# Patient Record
Sex: Female | Born: 1935
Health system: Southern US, Community
[De-identification: ages and names within clinical notes are randomized; demographics above are authoritative.]

## PROBLEM LIST (undated history)

## (undated) DIAGNOSIS — I509 Heart failure, unspecified: Secondary | ICD-10-CM

## (undated) DIAGNOSIS — F32A Depression, unspecified: Secondary | ICD-10-CM

## (undated) DIAGNOSIS — M1990 Unspecified osteoarthritis, unspecified site: Secondary | ICD-10-CM

## (undated) DIAGNOSIS — I219 Acute myocardial infarction, unspecified: Secondary | ICD-10-CM

## (undated) DIAGNOSIS — F329 Major depressive disorder, single episode, unspecified: Secondary | ICD-10-CM

## (undated) DIAGNOSIS — I10 Essential (primary) hypertension: Secondary | ICD-10-CM

## (undated) DIAGNOSIS — I2510 Atherosclerotic heart disease of native coronary artery without angina pectoris: Secondary | ICD-10-CM

## (undated) HISTORY — DX: Heart failure, unspecified: I50.9

## (undated) HISTORY — DX: Major depressive disorder, single episode, unspecified: F32.9

## (undated) HISTORY — DX: Essential (primary) hypertension: I10

## (undated) HISTORY — DX: Acute myocardial infarction, unspecified: I21.9

## (undated) HISTORY — DX: Depression, unspecified: F32.A

## (undated) HISTORY — DX: Unspecified osteoarthritis, unspecified site: M19.90

---

## 1992-07-11 DIAGNOSIS — I219 Acute myocardial infarction, unspecified: Secondary | ICD-10-CM

## 1992-07-11 HISTORY — DX: Acute myocardial infarction, unspecified: I21.9

## 1999-07-12 HISTORY — PX: CORONARY ARTERY BYPASS GRAFT: SHX141

## 2004-07-29 ENCOUNTER — Ambulatory Visit: Payer: Self-pay

## 2005-10-11 ENCOUNTER — Ambulatory Visit: Payer: Self-pay

## 2006-10-17 ENCOUNTER — Ambulatory Visit: Payer: Self-pay | Admitting: Internal Medicine

## 2008-01-08 ENCOUNTER — Ambulatory Visit: Payer: Self-pay | Admitting: Internal Medicine

## 2009-03-30 DIAGNOSIS — I2510 Atherosclerotic heart disease of native coronary artery without angina pectoris: Secondary | ICD-10-CM

## 2009-03-30 DIAGNOSIS — I10 Essential (primary) hypertension: Secondary | ICD-10-CM

## 2009-03-30 DIAGNOSIS — E7800 Pure hypercholesterolemia, unspecified: Secondary | ICD-10-CM

## 2009-04-07 ENCOUNTER — Ambulatory Visit: Payer: Self-pay | Admitting: Family Medicine

## 2010-01-19 ENCOUNTER — Ambulatory Visit: Payer: Self-pay | Admitting: Gastroenterology

## 2010-01-21 LAB — PATHOLOGY REPORT

## 2010-02-18 DIAGNOSIS — Z86010 Personal history of colonic polyps: Secondary | ICD-10-CM

## 2010-06-08 ENCOUNTER — Ambulatory Visit: Payer: Self-pay | Admitting: Family Medicine

## 2011-08-03 ENCOUNTER — Ambulatory Visit: Payer: Self-pay | Admitting: Family Medicine

## 2012-09-24 ENCOUNTER — Ambulatory Visit: Payer: Self-pay | Admitting: Family Medicine

## 2014-01-14 ENCOUNTER — Observation Stay: Payer: Self-pay | Admitting: Internal Medicine

## 2014-01-14 LAB — BASIC METABOLIC PANEL
Anion Gap: 8 (ref 7–16)
BUN: 11 mg/dL (ref 7–18)
CALCIUM: 9.3 mg/dL (ref 8.5–10.1)
CREATININE: 0.66 mg/dL (ref 0.60–1.30)
Chloride: 105 mmol/L (ref 98–107)
Co2: 26 mmol/L (ref 21–32)
GLUCOSE: 116 mg/dL — AB (ref 65–99)
Osmolality: 278 (ref 275–301)
Potassium: 3.9 mmol/L (ref 3.5–5.1)
SODIUM: 139 mmol/L (ref 136–145)

## 2014-01-14 LAB — CBC
HCT: 39.9 % (ref 35.0–47.0)
HGB: 13.8 g/dL (ref 12.0–16.0)
MCH: 32 pg (ref 26.0–34.0)
MCHC: 34.7 g/dL (ref 32.0–36.0)
MCV: 92 fL (ref 80–100)
Platelet: 119 10*3/uL — ABNORMAL LOW (ref 150–440)
RBC: 4.32 10*6/uL (ref 3.80–5.20)
RDW: 13 % (ref 11.5–14.5)
WBC: 10.4 10*3/uL (ref 3.6–11.0)

## 2014-01-14 LAB — TROPONIN I
Troponin-I: <0.02 ng/mL
Troponin-I: <0.02 ng/mL

## 2014-01-14 LAB — PRO B NATRIURETIC PEPTIDE: B-Type Natriuretic Peptide: 1816 pg/mL — ABNORMAL HIGH (ref 0–450)

## 2014-01-15 LAB — BASIC METABOLIC PANEL
Anion Gap: 9 (ref 7–16)
BUN: 12 mg/dL (ref 7–18)
CO2: 29 mmol/L (ref 21–32)
Calcium, Total: 8.9 mg/dL (ref 8.5–10.1)
Chloride: 102 mmol/L (ref 98–107)
Creatinine: 0.89 mg/dL (ref 0.60–1.30)
EGFR (African American): >60
Glucose: 122 mg/dL — ABNORMAL HIGH (ref 65–99)
Osmolality: 280 (ref 275–301)
Potassium: 2.9 mmol/L — ABNORMAL LOW (ref 3.5–5.1)
Sodium: 140 mmol/L (ref 136–145)

## 2014-01-15 LAB — TROPONIN I

## 2014-01-23 ENCOUNTER — Ambulatory Visit: Payer: Self-pay | Admitting: Family

## 2014-01-29 DIAGNOSIS — I25810 Atherosclerosis of coronary artery bypass graft(s) without angina pectoris: Secondary | ICD-10-CM | POA: Insufficient documentation

## 2014-01-29 DIAGNOSIS — I159 Secondary hypertension, unspecified: Secondary | ICD-10-CM | POA: Insufficient documentation

## 2014-06-30 ENCOUNTER — Ambulatory Visit: Payer: Self-pay | Admitting: Family Medicine

## 2014-07-21 ENCOUNTER — Ambulatory Visit: Payer: Self-pay | Admitting: Family Medicine

## 2014-11-01 NOTE — Discharge Summary (Signed)
Dates of Admission and Diagnosis:  Date of Admission 14-Jan-2014   Date of Discharge 15-Jan-2014   Admitting Diagnosis Acute chf   Final Diagnosis 1. Acute on chronic chf 2. HTN 3. CAD 4. Acute respiratory failure    Chief Complaint/History of Present Illness CHIEF COMPLAINT: Shortness of breath.   HISTORY OF PRESENT ILLNESS: An 81 year old Caucasian female patient with history of hypertension, CABG/coronary artery disease, presents to the Emergency Room complaining of shortness of breath which has progressively worsened over the past few days. She has experienced orthopnea lately, some swelling in her feet. Has had dry cough. Also complains of some pain in the right shoulder and right neck. She was on Lasix many years back but nothing recent. She does not complain of any chest pain. She is not any fluid or salt restriction diet but mentions that she does not drink much fluids or does not add any extra salt to her diet.   In the Emergency Room, the patient's sats have been found to be at 89% on room air with shortness of breath. BNP elevated at 1800. Chest x-ray showing some pulmonary edema and is being admitted to the hospitalist service.   Allergies:  No Known Allergies:   LabObservation:  08-Jul-15 12:53   OBSERVATION Reason for Test  Cardiology:  08-Jul-15 12:53   Echo Doppler REASON FOR EXAM:     COMMENTS:     PROCEDURE: [facility] - ECHO DOPPLER COMPLETE(TRANSTHOR)  - Jan 15 2014 12:53PM   RESULT: Echocardiogram Report  Patient Name:   Amanda Strickland Date of Exam: 01/15/2014 Medical Rec #:  [id number]           Custom1: Date of Birth:  01-04-1931       Height:       65.0 in Patient Age:    22 years         Weight:       163.1 lb Patient Gender: F                BSA:          1.81 m??  Indications: CHF Sonographer:    Sherrie Sport RDCS Referring Phys: Neita Carp  Summary:  1. Left ventricular ejection fraction, by visual estimation, is 50 to  55%.  2. Low normal global  left ventricular systolic function.  3. Mild left ventricular hypertrophy.  4. Mild mitral valve regurgitation.  5. Moderately increased left ventricular posterior wall thickness.  6. Mild tricuspid regurgitation. 2D AND M-MODE MEASUREMENTS (normal ranges within parentheses): Left Ventricle:          Normal IVSd (2D):      1.24 cm (0.7-1.1) LVPWd (2D):     1.31 cm (0.7-1.1) Aorta/LA:                  Normal LVIDd (2D):     4.39 cm (3.4-5.7) Aortic Root (2D): 3.00 cm (2.4-3.7) LVIDs (2D):     3.16 cm           Left Atrium (2D): 3.90 cm (1.9-4.0) LV FS (2D):     28.0 %   (>25%) LV EF (2D):     54.5 %   (>50%)                          Right Ventricle:  RVd (2D): LV DIASTOLIC FUNCTION: MV Peak E: 1.18 m/s E/e' Ratio: 25.80 MV Peak A: 0.81 m/s Decel Time: 197 msec E/A Ratio: 1.45 SPECTRAL DOPPLER ANALYSIS (where applicable): Mitral Valve: MV P1/2 Time: 57.13 msec MV Area, PHT: 3.85 cm?? Aortic Valve: AoV Max Vel: 1.17 m/s AoV Peak PG: 5.5 mmHg AoV Mean PG: LVOT Vmax: 0.86 m/s LVOT VTI:  LVOT Diameter: 2.10 cm AoV Area, Vmax: 2.54 cm?? AoV Area, VTI:  AoV Area, Vmn: Tricuspid Valve and PA/RV Systolic Pressure: TR Max Velocity: 2.16 m/s RA  Pressure: 5 mmHg RVSP/PASP: 23.7 mmHg Pulmonic Valve: PV Max Velocity: 0.90 m/s PV Max PG: 3.2 mmHg PV Mean PG:  PHYSICIAN INTERPRETATION: Left Ventricle: The left ventricular internal cavity size was normal. LV  posterior wall thickness was moderately increased. Mild left ventricular  hypertrophy. Global LV systolic function was low normal. Left ventricular  ejection fraction, by visual estimation, is 50 to 55%. Spectral Doppler  shows normal pattern of LV diastolic filling. Right Ventricle: The right ventricular size is normal. Left Atrium: The left atrium is normal in size. Right Atrium: The right atrium is normal in size. Pericardium: There is no evidence of pericardial effusion. Mitral Valve: Mild mitral  valve regurgitation is seen. Tricuspid Valve: Mild tricuspid regurgitation is visualized. The  tricuspid regurgitant velocity is 2.16 m/s, and with an assumed right  atrial pressure of 5 mmHg, the estimated right ventricular systolic  pressure is normal at 23.7 mmHg. Aortic Valve: The aortic valve is normal. Aorta: The aortic root is normal in size and structure.  [place] MD Electronically signed by 1950 Isaias Cowman MD Signature Date/Time: 01/15/2014/1:43:17 PM  *** Final *** IMPRESSION: .    Verified BySheppard Coil . PARASCHOS, M.D., MD  Routine Chem:  08-Jul-15 05:05   Glucose, Serum  122  BUN 12  Creatinine (comp) 0.89  Sodium, Serum 140  Potassium, Serum  2.9  Chloride, Serum 102  CO2, Serum 29  Calcium (Total), Serum 8.9  Anion Gap 9  Osmolality (calc) 280  eGFR (African American) >60  eGFR (Non-African American) >60 (eGFR values <28m/min/1.73 m2 may be an indication of chronic kidney disease (CKD). Calculated eGFR is useful in patients with stable renal function. The eGFR calculation will not be reliable in acutely ill patients when serum creatinine is changing rapidly. It is not useful in  patients on dialysis. The eGFR calculation may not be applicable to patients at the low and high extremes of body sizes, pregnant women, and vegetarians.)   Pertinent Past History:  Pertinent Past History PAST MEDICAL HISTORY: 1.  Coronary artery bypass graft 12 years back.  2.  Coronary artery disease.  3.  Hypertension.   Hospital Course:  Hospital Course * Acute on chronic diastolic chf Diuresed well with IV lasix. K supplemented. Feels back to baseline and was discharged home. Decompensated due to uncontrolled HTN  * UNcontrolled HTN Ran out of medications was waiting for them to arrive in mail Restarted on home meds and BP much better.  * Pulmonary fibrosis CXr suggested possible pulm fibrosis. Will have to f/u with PCP. If any issues  needs pulmonary referral  time spent on d/c 40 min  CHF clinic and cardiology f/u set up   Condition on Discharge Fair   Code Status:  Code Status Full Code   PHYSICAL EXAM ON DISCHARGE:  Physical Exam:  GEN obese   NECK supple  No masses   RESP normal resp effort  clear BS   CARD  regular rate   PSYCH alert, A+O to time, place, person   VITAL SIGNS:  Vital Signs: **Vital Signs.:   08-Jul-15 04:36  Vital Signs Type Routine  Temperature Temperature (F) 98.6  Celsius 37  Temperature Source oral  Pulse Pulse 60  Respirations Respirations 20  Systolic BP Systolic BP 712  Diastolic BP (mmHg) Diastolic BP (mmHg) 75  Mean BP 101  Pulse Ox % Pulse Ox % 92  Pulse Ox Activity Level  At rest  Oxygen Delivery Room Air/ 21 %    07:53  Vital Signs Type Routine  Temperature Temperature (F) 97.6  Celsius 36.4  Temperature Source oral  Pulse Pulse 67  Respirations Respirations 20  Systolic BP Systolic BP 929  Diastolic BP (mmHg) Diastolic BP (mmHg) 73  Mean BP 102  Pulse Ox % Pulse Ox % 91  Pulse Ox Activity Level  At rest  Oxygen Delivery Room Air/ 21 %    11:40  Vital Signs Type Routine  Temperature Temperature (F) 97.8  Celsius 36.5  Temperature Source oral  Pulse Pulse 54  Respirations Respirations 20  Systolic BP Systolic BP 090  Diastolic BP (mmHg) Diastolic BP (mmHg) 69  Mean BP 87  Pulse Ox % Pulse Ox % 90  Pulse Ox Activity Level  At rest  Oxygen Delivery Room Air/ 21 %   DISCHARGE INSTRUCTIONS HOME MEDS:  Medication Reconciliation: Patient's Home Medications at Discharge:     Medication Instructions  fish oil - oral capsule  1 cap(s) orally once a day   vitamin e  1 cap(s) orally once a day   multivitamin  1 tab(s) orally once a day   benazepril-hydrochlorothiazide 20 mg-12.5 mg oral tablet  1 tab(s) orally once a day   metoprolol succinate 50 mg oral tablet, extended release  1 tab(s) orally once a day   amlodipine 5 mg oral tablet  1 tab(s)  orally once a day   furosemide 40 mg oral tablet  1 tab(s) orally once a day   klor-con m20 oral tablet, extended release  1 tab(s) orally once a day   aspirin 81 mg oral tablet  1 tab(s) orally once a day     Physician's Instructions:  Diet Low Sodium   Activity Limitations As tolerated   Return to Work Not Applicable   Time frame for Follow Up Appointment 1-2 weeks  PCP   Time frame for Follow Up Appointment 1-2 weeks  Dr. Mohammed Kindle seen him before   Time frame for Follow Up Appointment 1-2 weeks  Next avilable chf clinic appt   Electronic Signatures: Alba Destine (MD)  (Signed 09-Jul-15 13:48)  Authored: ADMISSION DATE AND DIAGNOSIS, CHIEF COMPLAINT/HPI, Allergies, PERTINENT LABS, PERTINENT PAST HISTORY, HOSPITAL COURSE, PHYSICAL EXAM ON DISCHARGE, VITAL SIGNS, DISCHARGE INSTRUCTIONS HOME MEDS, PATIENT INSTRUCTIONS   Last Updated: 09-Jul-15 13:48 by Alba Destine (MD)

## 2014-11-01 NOTE — H&P (Signed)
PATIENT NAME:  Amanda Strickland, Amanda Strickland MR#:  [id number] DATE OF BIRTH:  16-Dec-1930  DATE OF ADMISSION:  01/14/2014  PRIMARY CARE PHYSICIAN:  Jerrell Belfast, MD  PRIMARY CARDIOLOGIST: Javier Docker. Fath, MD, but the patient has not seen him in many years.   CHIEF COMPLAINT: Shortness of breath.   HISTORY OF PRESENT ILLNESS: An 81 year old Caucasian female patient with history of hypertension, CABG/coronary artery disease, presents to the Emergency Room complaining of shortness of breath which has progressively worsened over the past few days. She has experienced orthopnea lately, some swelling in her feet. Has had dry cough. Also complains of some pain in the right shoulder and right neck. She was on Lasix many years back but nothing recent. She does not complain of any chest pain. She is not any fluid or salt restriction diet but mentions that she does not drink much fluids or does not add any extra salt to her diet.   In the Emergency Room, the patient's sats have been found to be at 89% on room air with shortness of breath. BNP elevated at 1800. Chest x-ray showing some pulmonary edema and is being admitted to the hospitalist service.   PAST MEDICAL HISTORY: 1.  Coronary artery bypass graft 12 years back.  2.  Coronary artery disease.  3.  Hypertension.   PAST SURGICAL HISTORY: Coronary artery bypass graft.   SOCIAL HISTORY: The patient does not smoke. No alcohol. No illicit drugs. Lives alone. Ambulates on her own.   CODE STATUS: Full code.   FAMILY HISTORY: Coronary artery disease.   HOME MEDICATIONS: 1.  Vitamin E 1 capsule oral once a day.  2.  Multivitamin 1 tablet daily.  3.  Metoprolol succinate 50 mg daily.  4.  Fish oil oral capsule 1000 mg daily.  5.  Benazepril/hydrochlorothiazide 20/12.5 one tablet daily.  6.  Amlodipine 5 mg daily.  7.  Aspirin 81 mg daily.   ALLERGIES: No known drug allergies.   REVIEW OF SYSTEMS:    CONSTITUTIONAL: Complains of some fatigue.  EYES: No  blurred vision, pain or redness.  ENT: No tinnitus, ear pain, hearing loss.  RESPIRATORY: Has dry cough. No wheeze. No chronic obstructive pulmonary disease.  CARDIOVASCULAR: No chest pain, has orthopnea and edema.  GASTROINTESTINAL: No nausea, vomiting, diarrhea, abdominal pain.  GENITOURINARY: No dysuria, hematuria, frequency.  ENDOCRINE: No polyuria, nocturia or thyroid problems.  HEMATOLOGIC AND LYMPHATIC: No anemia, easy bruising, bleeding.  INTEGUMENTARY: No acne, rash, lesion.  MUSCULOSKELETAL: No back pain, arthritis.  NEUROLOGICAL: No focal numbness, weakness, seizure.  PSYCHIATRIC: No anxiety or depression.   PHYSICAL EXAMINATION: VITAL SIGNS: Temperature 98.2, pulse of 70, blood pressure of 193/92, saturating 89% on room air, 92% on 2 liters oxygen.  GENERAL: Obese Caucasian female patient lying in bed.  PSYCHIATRIC: Alert and oriented x 3. Mood and affect appropriate. Judgment intact.  HEENT: Atraumatic, normocephalic. Oral mucosa moist and pink. External ears and nose normal. No pallor. No icterus. Pupils bilaterally equal and reactive to light.  NECK: Supple. No thyromegaly or palpable lymph nodes.  Trachea midline. No carotid bruit or JVD.  CARDIOVASCULAR: S1, S2 without any murmurs. Peripheral pulses 2+. Has 1+ edema.  RESPIRATORY: Has bilateral basal crackles, increased work of breathing.  GASTROINTESTINAL: Soft abdomen, nontender. Bowel sounds present. No visceromegaly palpable.   GENITOURINARY:  No CVA tenderness or bladder distention.  SKIN: Warm and dry. No petechiae, rash, ulcers.  MUSCULOSKELETAL: No joint swelling or redness in large joints. Normal muscle tone.  NEUROLOGICAL: Motor strength 5 out of 5 in upper and lower extremities. Sensation is intact all over.  LYMPHATIC: No cervical lymphadenopathy.   LABORATORY, DIAGNOSTIC AND RADIOLOGICAL DATA:  Shows:  1.  Glucose of 116, BNP of 1800, BUN 11, creatinine 0.66, sodium 139, potassium 3.9, GFR greater than 60.  Troponin less than 0.02.  2.  WBC 10.4, hemoglobin 11.8, platelets of 119.  3.  EKG shows normal sinus rhythm, left ventricular hypertrophy, poor R wave progression, no ST elevation.  4.  Chest x-ray shows pulmonary fibrosis and pulmonary edema.   ASSESSMENT AND PLAN: 1.  Acute respiratory failure secondary to acute on chronic congestive heart failure, unknown if systolic or diastolic. We will check an echocardiogram. We will start the patient on IV Lasix. Monitor Is and Os, fluid and salt restriction, cardiac diet. The patient does have history of coronary artery disease, coronary artery bypass graft, was on Lasix in the past which was stopped many years prior. Presently, her blood pressure is elevated and likely cause of decompensation.  2.  Uncontrolled hypertension. Patient has run out of the medication. She was awaiting home medications in the mail which have arrived earlier and she has taken her medications. Will continue home medications. Use IV p.r.n. medications as needed.  3.  Pulmonary fibrosis. Patient does not have this diagnosis but chest x-ray suggests pulmonary fibrosis. Will need outpatient pulmonary followup after discharge.  4.  Coronary artery disease, stable.  5.  Deep vein thrombosis prophylaxis with Lovenox.  6.  CODE STATUS: Full code.   TIME SPENT TODAY: On this case was 50 minutes.   ____________________________ Leia Alf Dio Giller, MD srs:cs D: 01/14/2014 17:41:52 ET T: 01/14/2014 18:05:43 ET JOB#: [id number]  cc: Alveta Heimlich R. Anthony Roland, MD, <Dictator> Jerrell Belfast, MD Javier Docker. Ubaldo Glassing, MD  Neita Carp MD ELECTRONICALLY SIGNED 01/21/2014 18:15

## 2014-11-19 DIAGNOSIS — R42 Dizziness and giddiness: Secondary | ICD-10-CM

## 2014-11-19 DIAGNOSIS — I5032 Chronic diastolic (congestive) heart failure: Secondary | ICD-10-CM

## 2014-11-19 DIAGNOSIS — I10 Essential (primary) hypertension: Secondary | ICD-10-CM

## 2015-02-06 ENCOUNTER — Telehealth: Payer: Self-pay | Admitting: Family Medicine

## 2015-02-06 NOTE — Telephone Encounter (Signed)
Pt's daughter Karena Addison called because they continue to get bills from [employer] for the Vitamin D that was done on 07/21/14 and insurance hasn't paid because of the incorrect diagnosis code. I advised that usually we request the bill to be brought in. I wanted to make sure that was correct. Thanks TNP

## 2015-05-25 ENCOUNTER — Ambulatory Visit (INDEPENDENT_AMBULATORY_CARE_PROVIDER_SITE_OTHER): Payer: Commercial Managed Care - HMO | Admitting: Family Medicine

## 2015-05-25 ENCOUNTER — Encounter: Payer: Self-pay | Admitting: Family Medicine

## 2015-05-25 ENCOUNTER — Other Ambulatory Visit: Payer: Self-pay

## 2015-05-25 VITALS — BP 142/68 | HR 75 | Temp 98.5°F | Resp 16 | Wt 172.0 lb

## 2015-05-25 DIAGNOSIS — R253 Fasciculation: Secondary | ICD-10-CM | POA: Insufficient documentation

## 2015-05-25 DIAGNOSIS — N183 Chronic kidney disease, stage 3 unspecified: Secondary | ICD-10-CM | POA: Insufficient documentation

## 2015-05-25 DIAGNOSIS — I8390 Asymptomatic varicose veins of unspecified lower extremity: Secondary | ICD-10-CM | POA: Insufficient documentation

## 2015-05-25 DIAGNOSIS — I110 Hypertensive heart disease with heart failure: Secondary | ICD-10-CM | POA: Insufficient documentation

## 2015-05-25 DIAGNOSIS — R05 Cough: Secondary | ICD-10-CM

## 2015-05-25 DIAGNOSIS — R059 Cough, unspecified: Secondary | ICD-10-CM

## 2015-05-25 DIAGNOSIS — M542 Cervicalgia: Secondary | ICD-10-CM | POA: Diagnosis not present

## 2015-05-25 DIAGNOSIS — R509 Fever, unspecified: Secondary | ICD-10-CM | POA: Diagnosis not present

## 2015-05-25 DIAGNOSIS — R739 Hyperglycemia, unspecified: Secondary | ICD-10-CM | POA: Insufficient documentation

## 2015-05-25 DIAGNOSIS — M810 Age-related osteoporosis without current pathological fracture: Secondary | ICD-10-CM | POA: Insufficient documentation

## 2015-05-25 LAB — POC INFLUENZA A&B (BINAX/QUICKVUE)
INFLUENZA A, POC: NEGATIVE
Influenza B, POC: NEGATIVE

## 2015-05-25 MED ORDER — GUAIFENESIN-CODEINE 100-10 MG/5ML PO SOLN: 5.0000 mL | Freq: Four times a day (QID) | ORAL | Status: DC | PRN

## 2015-05-25 NOTE — Progress Notes (Signed)
Patient ID: Amanda Strickland, female   DOB: [birth year], 81 y.o.   MRN: [id number] Name: Amanda Strickland   MRN: [id number]    DOB: 31-Mar-1931   Date:05/25/2015       Progress Note  Subjective  Chief Complaint  Chief Complaint  Patient presents with  . URI   URI  This is a new problem. The current episode started in the past 7 days. The problem has been unchanged. Maximum temperature: 100.0 F yesterday. Associated symptoms include congestion, coughing, headaches, joint pain, neck pain, rhinorrhea, sneezing and a sore throat. Associated symptoms comments: No neck or shoulder pains until yesterday. Some greenish sputum production.. She has tried decongestant, NSAIDs and acetaminophen for the symptoms. The treatment provided mild relief.   Past Surgical History  Procedure Laterality Date  . Coronary artery bypass graft  2001    Past Medical History  Diagnosis Date  . CHF (congestive heart failure) ([place])   . Hypertension   . Heart attack ([place]) 1994  . Depression   . Arthritis    Social History  Substance Use Topics  . Smoking status: Never Smoker   . Smokeless tobacco: Never Used  . Alcohol Use: No    Current outpatient prescriptions:  .  amLODipine (NORVASC) 5 MG tablet, Take 5 mg by mouth daily., Disp: , Rfl:  .  aspirin 81 MG tablet, Take 81 mg by mouth daily., Disp: , Rfl:  .  benazepril-hydrochlorthiazide (LOTENSIN HCT) 20-12.5 MG per tablet, Take 1 tablet by mouth daily., Disp: , Rfl:  .  CALCIUM CARBONATE-VIT D-MIN PO, Take by mouth., Disp: , Rfl:  .  L-Lysine 500 MG CAPS, Take by mouth., Disp: , Rfl:  .  metoprolol succinate (TOPROL-XL) 50 MG 24 hr tablet, Take 50 mg by mouth daily. Take with or immediately following a meal., Disp: , Rfl:  .  Misc Natural Products (OSTEO BI-FLEX ADV JOINT SHIELD) TABS, Take by mouth., Disp: , Rfl:  .  Multiple Vitamin (MULTIVITAMIN) capsule, Take 1 capsule by mouth daily., Disp: , Rfl:  .  Omega-3 Fatty Acids (FISH OIL) 1000 MG CAPS,  Take 1 capsule by mouth daily., Disp: , Rfl:  .  simvastatin (ZOCOR) 20 MG tablet, Take by mouth., Disp: , Rfl:  .  vitamin E 100 UNIT capsule, Take 100 Units by mouth daily., Disp: , Rfl:  .  furosemide (LASIX) 40 MG tablet, Take 40 mg by mouth daily., Disp: , Rfl:  .  potassium chloride SA (K-DUR,KLOR-CON) 20 MEQ tablet, Take 20 mEq by mouth daily., Disp: , Rfl:   No Known Allergies  Review of Systems  Constitutional: Negative.   HENT: Positive for congestion, rhinorrhea, sneezing and sore throat.   Eyes: Negative.   Respiratory: Positive for cough.   Cardiovascular: Negative.   Gastrointestinal: Negative.   Genitourinary: Negative.   Musculoskeletal: Positive for joint pain and neck pain.  Skin: Negative.   Neurological: Positive for headaches.  Endo/Heme/Allergies: Negative.   Psychiatric/Behavioral: Negative.    Objective  Filed Vitals:   05/25/15 1355  BP: 142/68  Pulse: 75  Temp: 98.5 F (36.9 C)  TempSrc: Oral  Resp: 16  Weight: 172 lb (78.019 kg)  SpO2: 97%   Physical Exam  Constitutional: She is oriented to person, place, and time and well-developed, well-nourished, and in no distress.  HENT:  Head: Normocephalic and atraumatic.  Right Ear: External ear normal.  Left Ear: External ear normal.  Nose: Nose normal.  Mouth/Throat: Oropharynx is clear and moist.  Good transillumination of sinuses. No redness of throat.   Neck:  Soreness in posterior cervical muscles and decreased ROM due to spasm.  Cardiovascular: Normal rate, regular rhythm and normal heart sounds.   Pulmonary/Chest: Effort normal and breath sounds normal.  Abdominal: Soft. Bowel sounds are normal.  Musculoskeletal: She exhibits tenderness.  Cervical muscles posteriorly. Limits ROM.  Lymphadenopathy:    She has no cervical adenopathy.  Neurological: She is alert and oriented to person, place, and time.  Psychiatric: Affect and judgment normal.    Assessment & Plan  1. Fever and  chills Onset yesterday with cough and headache. Influenza A&B tests are negative. Increase fluid intake and may use Tylenol or Advil prn. Will get CBC with diff to rule out bacterial infection. Recheck pending lab report. - POC Influenza A&B (Binax test) - CBC with Differential/Platelet  2. Neck pain Onset yesterday with cough and headache. Sharp pain with certain movements of head. No rigidity but some guarding. Apply moist heat and use of codeine cough syrup should help with discomfort. - POC Influenza A&B (Binax test)  3. Cough Onset yesterday with very little sputum production. Treat with Robitussin-AC and increase in fluid intake. Home to rest and recheck pending lab reports. - POC Influenza A&B (Binax test)

## 2015-05-26 LAB — CBC WITH DIFFERENTIAL/PLATELET
BASOS: 1 %
Basophils Absolute: 0.1 10*3/uL (ref 0.0–0.2)
EOS (ABSOLUTE): 0.3 10*3/uL (ref 0.0–0.4)
EOS: 2 %
HEMATOCRIT: 40.7 % (ref 34.0–46.6)
HEMOGLOBIN: 14.5 g/dL (ref 11.1–15.9)
Immature Grans (Abs): 0.1 10*3/uL (ref 0.0–0.1)
Immature Granulocytes: 1 %
LYMPHS ABS: 2.5 10*3/uL (ref 0.7–3.1)
Lymphs: 18 %
MCH: 32.1 pg (ref 26.6–33.0)
MCHC: 35.6 g/dL (ref 31.5–35.7)
MCV: 90 fL (ref 79–97)
MONOCYTES: 13 %
Monocytes Absolute: 1.5 10*3/uL — ABNORMAL HIGH (ref 0.1–0.9)
NEUTROS ABS: 8.5 10*3/uL — AB (ref 1.4–7.0)
Neutrophils: 65 %
Platelets: 192 10*3/uL (ref 150–379)
RBC: 4.52 x10E6/uL (ref 3.77–5.28)
RDW: 13.1 % (ref 12.3–15.4)
WBC: 12.9 10*3/uL — ABNORMAL HIGH (ref 3.4–10.8)

## 2015-05-29 ENCOUNTER — Telehealth: Payer: Self-pay

## 2015-05-29 DIAGNOSIS — D72829 Elevated white blood cell count, unspecified: Secondary | ICD-10-CM

## 2015-05-29 DIAGNOSIS — R059 Cough, unspecified: Secondary | ICD-10-CM

## 2015-05-29 DIAGNOSIS — R05 Cough: Secondary | ICD-10-CM

## 2015-05-29 MED ORDER — AMOXICILLIN 875 MG PO TABS: 875.0000 mg | ORAL_TABLET | Freq: Two times a day (BID) | ORAL | Status: DC

## 2015-05-29 NOTE — Telephone Encounter (Signed)
-----   Message from Margo Common, [place] sent at 05/29/2015 12:37 AM EST ----- Final report of blood cell counts show elevation of WBC count with increase in neutrophils. Usually indicates some bacterial infection. If any further cough, sputum production or fever will need antibiotic (Amoxicillin 875 mg BID #20) and chest x-ray. Recheck in 10 days to be sure WBC counts back to normal.

## 2015-05-29 NOTE — Telephone Encounter (Signed)
Patient advised as directed below. Patient verbalized understanding. Patient states she is still coughing, fever and sputum production. RX sent to pharmacy. CXR ordered. Patient states she will get CXR on Monday.

## 2015-05-29 NOTE — Telephone Encounter (Signed)
LMTCB

## 2015-05-29 NOTE — Telephone Encounter (Signed)
Pt called back. °

## 2015-06-01 ENCOUNTER — Ambulatory Visit
Admission: RE | Admit: 2015-06-01 | Discharge: 2015-06-01 | Disposition: A | Discharge status: Home / Self Care | Payer: Commercial Managed Care - HMO | Source: Ambulatory Visit | Attending: Family Medicine | Admitting: Family Medicine

## 2015-06-01 ENCOUNTER — Other Ambulatory Visit: Payer: Self-pay | Admitting: Family Medicine

## 2015-06-01 ENCOUNTER — Telehealth: Payer: Self-pay | Admitting: Family Medicine

## 2015-06-01 DIAGNOSIS — R05 Cough: Secondary | ICD-10-CM | POA: Diagnosis not present

## 2015-06-01 DIAGNOSIS — R059 Cough, unspecified: Secondary | ICD-10-CM

## 2015-06-01 NOTE — Telephone Encounter (Signed)
Will call patient when receive CXR results.

## 2015-06-01 NOTE — Telephone Encounter (Signed)
CXR ordered changed to [place]. Patient advised.

## 2015-06-01 NOTE — Telephone Encounter (Signed)
Pt would like to have her chest x-ray moved to the [place] [facility].  Pt states you set her one up for the [facility].  Please contact the pt once the x-ray is moved.

## 2015-06-01 NOTE — Telephone Encounter (Signed)
Pt's daughter Margarita Grizzle called to get results from pt's chest Xray that was done today 06/01/15 but since she isn't on DPR she request that we call her sister Karena Addison @ [phone]. Thanks TNP

## 2015-06-03 ENCOUNTER — Telehealth: Payer: Self-pay | Admitting: Family Medicine

## 2015-06-03 DIAGNOSIS — J40 Bronchitis, not specified as acute or chronic: Secondary | ICD-10-CM

## 2015-06-03 MED ORDER — AMOXICILLIN-POT CLAVULANATE 875-125 MG PO TABS: 1.0000 | ORAL_TABLET | Freq: Two times a day (BID) | ORAL | Status: DC

## 2015-06-03 MED ORDER — ALBUTEROL SULFATE HFA 108 (90 BASE) MCG/ACT IN AERS: 1.0000 | INHALATION_SPRAY | Freq: Four times a day (QID) | RESPIRATORY_TRACT | Status: DC | PRN

## 2015-06-03 NOTE — Telephone Encounter (Signed)
Pt seen on 05/25/2015. Had CXR performed. Renaldo Fiddler, CMA

## 2015-06-03 NOTE — Telephone Encounter (Signed)
Margarita Grizzle pt's daughter called wanting to get her mom an inhaler for cough.  She uses [facility].  Daughters callback is [phone]  Please let daughter know if you are going to send an order to [facility].    Thanks Con Memos

## 2015-06-03 NOTE — Telephone Encounter (Signed)
Pt's daughter called wanting to know results from chest xr earlier this week.  She said mom was still coughing up yellow and green.  She normally sees Dr. Venia Minks and she ask if Simona Huh has not reviewed the xray could Dr. Venia Minks.    She would like to know the results today with the holiday coming up.  Her call back is [phone]  Thanks Con Memos

## 2015-06-03 NOTE — Telephone Encounter (Signed)
[employer] if insurance will cover it. Renaldo Fiddler, CMA

## 2015-06-03 NOTE — Telephone Encounter (Signed)
Advised daughter as below. She reports that Simona Huh prescribed Amoxil 875mg  and patient is still currently taking medication. She is requesting switching to a stronger abx. Changed abx to Augmentin X 10days per Dr. Venia Minks. Medication was sent into the pharmacy. Advised to call if not improving.

## 2015-06-03 NOTE — Telephone Encounter (Signed)
Please clarify what inhaler. Thanks.

## 2015-06-03 NOTE — Telephone Encounter (Signed)
Daughter called back wanting to know if her mom could get an inhaler for her cough.  She has [employer].  Please let daughter know if we are sending an order to [facility]  Daughters call back  [phone]  Thanks Con Memos

## 2015-06-03 NOTE — Telephone Encounter (Signed)
CXR shows possible bronchitis. No pneumonia. Please clarify if patient was treated with antibiotic, how long she has been sick and is she worsening. Thanks.

## 2015-06-03 NOTE — Telephone Encounter (Signed)
Amanda Strickland is out of office today. Can you review CXR results.

## 2015-06-08 ENCOUNTER — Telehealth: Payer: Self-pay

## 2015-06-08 ENCOUNTER — Encounter: Payer: Self-pay | Admitting: Family Medicine

## 2015-06-08 ENCOUNTER — Ambulatory Visit (INDEPENDENT_AMBULATORY_CARE_PROVIDER_SITE_OTHER): Payer: Commercial Managed Care - HMO | Admitting: Family Medicine

## 2015-06-08 VITALS — BP 122/70 | HR 72 | Temp 97.6°F | Resp 18 | Wt 170.6 lb

## 2015-06-08 DIAGNOSIS — J209 Acute bronchitis, unspecified: Secondary | ICD-10-CM | POA: Diagnosis not present

## 2015-06-08 MED ORDER — ALBUTEROL SULFATE (2.5 MG/3ML) 0.083% IN NEBU: 2.5000 mg | INHALATION_SOLUTION | Freq: Once | RESPIRATORY_TRACT | Status: DC

## 2015-06-08 MED ORDER — BUDESONIDE-FORMOTEROL FUMARATE 160-4.5 MCG/ACT IN AERO: 2.0000 | INHALATION_SPRAY | Freq: Two times a day (BID) | RESPIRATORY_TRACT | Status: DC

## 2015-06-08 MED ORDER — PREDNISONE 10 MG PO TABS: ORAL_TABLET | ORAL | Status: DC

## 2015-06-08 NOTE — Progress Notes (Signed)
Patient ID: Amanda Strickland, female   DOB: 09-30-1930, 81 y.o.   MRN: [id number]     Subjective:  HPI Bronchitis: Follow up from 05/25/2015. Started Amoxil and Guaifenesin-Codeine on 05/25/2015. Dr. Venia Minks changed antibiotic to Augmentin on 06/03/2015. Feeling some improvement since changing the antibiotic but still some cough with occasional wheeze. No further fever and occasional sputum production. No URI symptoms or fever.    Prior to Admission medications   Medication Sig Start Date End Date Taking? Authorizing Provider  albuterol (PROVENTIL HFA;VENTOLIN HFA) 108 (90 BASE) MCG/ACT inhaler Inhale 1-2 puffs into the lungs 4 (four) times daily as needed for wheezing or shortness of breath. 06/03/15  Yes Margarita Rana, MD  amLODipine (NORVASC) 5 MG tablet Take 5 mg by mouth daily.   Yes Historical Provider, MD  amoxicillin-clavulanate (AUGMENTIN) 875-125 MG tablet Take 1 tablet by mouth 2 (two) times daily. 06/03/15  Yes Margarita Rana, MD  aspirin 81 MG tablet Take 81 mg by mouth daily.   Yes Historical Provider, MD  benazepril-hydrochlorthiazide (LOTENSIN HCT) 20-12.5 MG per tablet Take 1 tablet by mouth daily.   Yes Historical Provider, MD  CALCIUM CARBONATE-VIT D-MIN PO Take by mouth.   Yes Historical Provider, MD  furosemide (LASIX) 40 MG tablet Take 40 mg by mouth daily.   Yes Historical Provider, MD  guaiFENesin-codeine 100-10 MG/5ML syrup Take 5 mLs by mouth every 6 (six) hours as needed for cough. 05/25/15  Yes Dennis E Chrismon, PA  L-Lysine 500 MG CAPS Take by mouth.   Yes Historical Provider, MD  metoprolol succinate (TOPROL-XL) 50 MG 24 hr tablet Take 50 mg by mouth daily. Take with or immediately following a meal.   Yes Historical Provider, MD  Misc Natural Products (OSTEO BI-FLEX ADV JOINT SHIELD) TABS Take by mouth.   Yes Historical Provider, MD  Multiple Vitamin (MULTIVITAMIN) capsule Take 1 capsule by mouth daily.   Yes Historical Provider, MD  Omega-3 Fatty Acids (FISH  OIL) 1000 MG CAPS Take 1 capsule by mouth daily.   Yes Historical Provider, MD  potassium chloride SA (K-DUR,KLOR-CON) 20 MEQ tablet Take 20 mEq by mouth daily.   Yes Historical Provider, MD  simvastatin (ZOCOR) 20 MG tablet Take by mouth. 05/27/14  Yes Historical Provider, MD  vitamin E 100 UNIT capsule Take 100 Units by mouth daily.   Yes Historical Provider, MD    Patient Active Problem List   Diagnosis Date Noted  . Chronic kidney disease (CKD), stage III (moderate) 05/25/2015  . Congestive heart failure due to high blood pressure ([place]) 05/25/2015  . Fasciculation 05/25/2015  . Blood glucose elevated 05/25/2015  . OP (osteoporosis) 05/25/2015  . Leg varices 05/25/2015  . Chronic diastolic heart failure ([place]) 11/19/2014  . HTN (hypertension) 11/19/2014  . Dizziness 11/19/2014  . Arteriosclerosis of bypass graft of coronary artery 01/29/2014  . History of colon polyps 02/18/2010  . Benign hypertension 03/30/2009  . Arteriosclerosis of coronary artery 03/30/2009  . Hypercholesteremia 03/30/2009    Past Medical History  Diagnosis Date  . CHF (congestive heart failure) ([place])   . Hypertension   . Heart attack ([place]) 1994  . Depression   . Arthritis     Social History   Social History  . Marital Status: Widowed    Spouse Name: N/A  . Number of Children: N/A  . Years of Education: N/A   Occupational History  . Not on file.   Social History Main Topics  . Smoking status: Never Smoker   .  Smokeless tobacco: Never Used  . Alcohol Use: No  . Drug Use: No  . Sexual Activity: Not on file   Other Topics Concern  . Not on file   Social History Narrative    No Known Allergies  Review of Systems  Constitutional: Negative.   HENT: Positive for congestion.   Eyes: Negative.   Respiratory: Positive for cough, sputum production and wheezing.   Cardiovascular: Negative.   Gastrointestinal: Negative.   Genitourinary: Negative.   Musculoskeletal: Negative.   Skin:  Negative.   Neurological: Negative.   Endo/Heme/Allergies: Negative.   Psychiatric/Behavioral: Negative.      There is no immunization history on file for this patient. Objective:  BP 122/70 mmHg  Pulse 72  Temp(Src) 97.6 F (36.4 C) (Oral)  Resp 18  Wt 170 lb 9.6 oz (77.384 kg)  SpO2 98%  Physical Exam  Constitutional: She is oriented to person, place, and time and well-developed, well-nourished, and in no distress.  HENT:  Head: Normocephalic.  Right Ear: External ear normal.  Left Ear: External ear normal.  Nose: Nose normal.  Mouth/Throat: Oropharynx is clear and moist.  Eyes: Conjunctivae and EOM are normal.  Neck: Neck supple.  Cardiovascular: Normal rate, regular rhythm and normal heart sounds.   Pulmonary/Chest:  Coarse rhonchi in the right posterior base.  Abdominal: Soft. Bowel sounds are normal.  Musculoskeletal: Normal range of motion.  Lymphadenopathy:    She has no cervical adenopathy.  Neurological: She is alert and oriented to person, place, and time.    Lab Results  Component Value Date   WBC 12.9* 05/25/2015   HGB 13.8 01/14/2014   HCT 40.7 05/25/2015   PLT 119* 01/14/2014   GLUCOSE 122* 01/15/2014    CMP     Component Value Date/Time   NA 140 01/15/2014 0505   K 2.9* 01/15/2014 0505   CL 102 01/15/2014 0505   CO2 29 01/15/2014 0505   GLUCOSE 122* 01/15/2014 0505   BUN 12 01/15/2014 0505   CREATININE 0.89 01/15/2014 0505   CALCIUM 8.9 01/15/2014 0505   GFRNONAA >60 01/15/2014 0505   GFRAA >60 01/15/2014 0505    Assessment and Plan :  1. Bronchitis, acute, with bronchospasm Some improvement since changing from the Amoxicillin to the Augmentin. Still using Albuterol prn for wheeze and Guaifenesin with Codeine for cough. Given nebulizer with albuterol to loosen congestion and added Symbicort for wheeze BID. Added prednisone taper and increased fluid intake. Recheck after finishing the antibiotic if needed. - predniSONE (DELTASONE) 10 MG  tablet; Take taper as directed over the next 6 days. (6,5,4,3,2,1)  Dispense: 21 tablet; Refill: 0 - budesonide-formoterol (SYMBICORT) 160-4.5 MCG/ACT inhaler; Inhale 2 puffs into the lungs 2 (two) times daily.  Dispense: 3 Inhaler; Refill: [facility] 06/08/2015 2:16 PM

## 2015-06-08 NOTE — Telephone Encounter (Signed)
Unable to leave message. Will try again later.

## 2015-06-08 NOTE — Telephone Encounter (Signed)
Patient has a follow up appointment this afternoon with Simona Huh.

## 2015-06-08 NOTE — Addendum Note (Signed)
Addended by: Jules Schick on: 06/08/2015 04:23 PM   Modules accepted: Orders

## 2015-06-08 NOTE — Telephone Encounter (Signed)
-----   Message from Margo Common, [place] sent at 06/08/2015  6:13 AM EST ----- Chest x-ray confirms bronchitis without pneumonia. Finish all the antibiotics and schedule recheck with Dr. Venia Minks to confirm WBC count back to normal if any symptoms remain.

## 2015-06-10 ENCOUNTER — Telehealth: Payer: Self-pay | Admitting: Family Medicine

## 2015-06-10 NOTE — Telephone Encounter (Signed)
Pt was in on Monday, Simona Huh gave her written Rx's for symbicort inhaler.  Her daughter called saying [facility] Rossetti will not accept a faxed order from her but will from [place] .  She wants to know if we will fax the order to [facility].  Daughters call back is [phone] Please let her know what we are going to do.  Thanks Con Memos

## 2015-06-10 NOTE — Telephone Encounter (Signed)
Pt's daughter called back about the RX. I advised that Simona Huh is out of the office today. Daughter got upset. I spoke with Lexine Baton and I advised daughter that if she brings the hard copy back in we would fax it to [facility]. Thanks TNP

## 2015-06-10 NOTE — Telephone Encounter (Signed)
Please advise 

## 2015-06-12 ENCOUNTER — Other Ambulatory Visit: Payer: Self-pay | Admitting: Family Medicine

## 2015-06-12 DIAGNOSIS — J209 Acute bronchitis, unspecified: Secondary | ICD-10-CM

## 2015-06-12 MED ORDER — BUDESONIDE-FORMOTEROL FUMARATE 160-4.5 MCG/ACT IN AERO: 2.0000 | INHALATION_SPRAY | Freq: Two times a day (BID) | RESPIRATORY_TRACT | Status: DC

## 2015-06-12 NOTE — Telephone Encounter (Signed)
Pt's daughter Mickel Baas called b/c she would like the RX for budesonide-formoterol [facility]) 160-4.5 MCG/ACT inhaler called into [facility] b/c after she brought the hard copy back to the office on 06/10/15 for [place] to  fax it to [facility] mail order she found out the co-pay is going to be more than it would be at [facility]. Mickel Baas would like a call back once this has been done. Please advise. Thanks TNP

## 2015-06-12 NOTE — Telephone Encounter (Signed)
RX e scribed to [facility]. Left a voicemail for patient's daughter Margarita Grizzle advising her that the RX has been sent to pharmacy.

## 2015-06-22 ENCOUNTER — Ambulatory Visit
Admission: RE | Admit: 2015-06-22 | Discharge: 2015-06-22 | Disposition: A | Discharge status: Home / Self Care | Payer: Commercial Managed Care - HMO | Source: Ambulatory Visit | Attending: Family Medicine | Admitting: Family Medicine

## 2015-06-22 ENCOUNTER — Encounter: Payer: Self-pay | Admitting: Family Medicine

## 2015-06-22 ENCOUNTER — Ambulatory Visit (INDEPENDENT_AMBULATORY_CARE_PROVIDER_SITE_OTHER): Payer: Commercial Managed Care - HMO | Admitting: Family Medicine

## 2015-06-22 VITALS — BP 118/68 | HR 75 | Temp 97.6°F | Resp 16 | Wt 168.6 lb

## 2015-06-22 DIAGNOSIS — J40 Bronchitis, not specified as acute or chronic: Secondary | ICD-10-CM | POA: Insufficient documentation

## 2015-06-22 DIAGNOSIS — J209 Acute bronchitis, unspecified: Secondary | ICD-10-CM | POA: Diagnosis not present

## 2015-06-22 MED ORDER — LEVOFLOXACIN 500 MG PO TABS: 500.0000 mg | ORAL_TABLET | Freq: Every day | ORAL | Status: DC

## 2015-06-22 MED ORDER — AEROCHAMBER MINI CHAMBER DEVI: Status: DC

## 2015-06-22 MED ORDER — GUAIFENESIN-CODEINE 100-10 MG/5ML PO SOLN: 5.0000 mL | Freq: Four times a day (QID) | ORAL | Status: DC | PRN

## 2015-06-22 MED ORDER — BUDESONIDE-FORMOTEROL FUMARATE 160-4.5 MCG/ACT IN AERO: 2.0000 | INHALATION_SPRAY | Freq: Two times a day (BID) | RESPIRATORY_TRACT | Status: DC

## 2015-06-22 MED ORDER — ALBUTEROL SULFATE (2.5 MG/3ML) 0.083% IN NEBU: 2.5000 mg | INHALATION_SOLUTION | Freq: Four times a day (QID) | RESPIRATORY_TRACT | Status: DC | PRN

## 2015-06-22 NOTE — Progress Notes (Signed)
Patient ID: Amanda Strickland, female   DOB: 03-31-1931, 81 y.o.   MRN: [id number]   [name] Female    DOB: 1931-05-18   81 y.o.   MRN: [id number] Visit Date: 06/22/2015  Today's Provider: Vernie Murders, PA   Chief Complaint  Patient presents with  . Bronchitis  . Follow-up   Subjective:    HPI Bronchitis Follow Up: Developed cough and congestion over the past month. Initial chest x-ray on 06-01-15 showed signs of bronchitis without infiltrates. Still coughing and feeling hoarse but states she "feels fine". Has been using Albuterol once a day as needed with Symbicort BID - (misses some doses). Has had a couple increases in temperature to 100.5. Finished the 10 day prednisone taper  Past Surgical History  Procedure Laterality Date  . Coronary artery bypass graft  2001   Family History  Problem Relation Age of Onset  . Heart attack Father   . Breast cancer Maternal Aunt       No Known Allergies   Previous Medications   ALBUTEROL (PROVENTIL HFA;VENTOLIN HFA) 108 (90 BASE) MCG/ACT INHALER    Inhale 1-2 puffs into the lungs 4 (four) times daily as needed for wheezing or shortness of breath.   AMLODIPINE (NORVASC) 5 MG TABLET    Take 5 mg by mouth daily.   ASPIRIN 81 MG TABLET    Take 81 mg by mouth daily.   BENAZEPRIL-HYDROCHLORTHIAZIDE (LOTENSIN HCT) 20-12.5 MG PER TABLET    Take 1 tablet by mouth daily.   BUDESONIDE-FORMOTEROL (SYMBICORT) 160-4.5 MCG/ACT INHALER    Inhale 2 puffs into the lungs 2 (two) times daily.   CALCIUM CARBONATE-VIT D-MIN PO    Take by mouth.   FUROSEMIDE (LASIX) 40 MG TABLET    Take 40 mg by mouth daily.   GUAIFENESIN-CODEINE 100-10 MG/5ML SYRUP    Take 5 mLs by mouth every 6 (six) hours as needed for cough.   L-LYSINE 500 MG CAPS    Take by mouth.   METOPROLOL SUCCINATE (TOPROL-XL) 50 MG 24 HR TABLET    Take 50 mg by mouth daily. Take with or immediately following a meal.   MISC NATURAL PRODUCTS (OSTEO BI-FLEX ADV JOINT SHIELD) TABS     Take by mouth.   MULTIPLE VITAMIN (MULTIVITAMIN) CAPSULE    Take 1 capsule by mouth daily.   OMEGA-3 FATTY ACIDS (FISH OIL) 1000 MG CAPS    Take 1 capsule by mouth daily.   POTASSIUM CHLORIDE SA (K-DUR,KLOR-CON) 20 MEQ TABLET    Take 20 mEq by mouth daily.   SIMVASTATIN (ZOCOR) 20 MG TABLET    Take by mouth.   VITAMIN E 100 UNIT CAPSULE    Take 100 Units by mouth daily.    Review of Systems  Constitutional: Negative.   HENT: Positive for congestion.   Eyes: Negative.   Respiratory: Positive for cough, shortness of breath and wheezing.   Gastrointestinal: Negative.   Endocrine: Negative.   Genitourinary: Negative.   Musculoskeletal: Negative.   Skin: Negative.   Allergic/Immunologic: Negative.   Neurological: Negative.   Psychiatric/Behavioral: Negative.     Social History  Substance Use Topics  . Smoking status: Never Smoker   . Smokeless tobacco: Never Used  . Alcohol Use: No   Objective:   BP 118/68 mmHg  Pulse 75  Temp(Src) 97.6 F (36.4 C) (Oral)  Resp 16  Wt 168 lb 9.6 oz (76.476 kg)  SpO2 93% Wt Readings from Last 3 Encounters:  06/22/15 168 lb  9.6 oz (76.476 kg)  06/08/15 170 lb 9.6 oz (77.384 kg)  05/25/15 172 lb (78.019 kg)     Physical Exam  Constitutional: She is oriented to person, place, and time. She appears well-developed and well-nourished.  HENT:  Head: Normocephalic.  Nose: Nose normal.  Mouth/Throat: Oropharynx is clear and moist.  Eyes: Conjunctivae and EOM are normal.  Neck: Normal range of motion. Neck supple.  Cardiovascular: Normal rate, regular rhythm and normal heart sounds.   Pulmonary/Chest: Effort normal. She has wheezes.  Slight wheezes with coarse cough and some shortness of breath.  Musculoskeletal: Normal range of motion.  Neurological: She is alert and oriented to person, place, and time.      Assessment & Plan:      1. Bronchitis with bronchospasm Continues to have intermittent wheezing and rare fever. Will treat with  quinolone, cough syrup and add a home nebulizer for albuterol solution (daughter is a nurse to help with administration). Given Symbicort to fill locally since mail order has not come in yet. Given Aerochamber to use with inhalers. Recheck labs and CXR. Recheck in 7-10 days. May need referral to pulmonologist if no better. - albuterol (PROVENTIL) (2.5 MG/3ML) 0.083% nebulizer solution; Take 3 mLs (2.5 mg total) by nebulization every 6 (six) hours as needed for wheezing or shortness of breath.  Dispense: 150 mL; Refill: 1 - Spacer/Aero-Holding Chambers (AEROCHAMBER MINI CHAMBER) DEVI; Use spacer with inhalers.  Dispense: 1 Device; Refill: 0 - levofloxacin (LEVAQUIN) 500 MG tablet; Take 1 tablet (500 mg total) by mouth daily.  Dispense: 7 tablet; Refill: 0 - guaiFENesin-codeine 100-10 MG/5ML syrup; Take 5 mLs by mouth every 6 (six) hours as needed for cough.  Dispense: 118 mL; Refill: 1 - CBC with Differential/Platelet - COMPLETE METABOLIC PANEL WITH GFR - DG Chest 2 View

## 2015-06-22 NOTE — Patient Instructions (Signed)
How to Use a Nebulizer  If you have asthma or other breathing problems, you might need to breathe in (inhale) medicine. This can be done with a nebulizer. A nebulizer is a device that turns liquid medicine into a mist that you can inhale.   There are different kinds of nebulizers. Most are small. With some, you breathe in through a mouthpiece. With others, a mask fits over your nose and mouth. Most nebulizers must be connected to a small air compressor. Air is forced through tubing from the compressor to the nebulizer. The forced air changes the liquid into a fine spray.  RISKS AND COMPLICATIONS  The nebulizer must work properly for it to help your breathing. If the nebulizer does not produce mist, or if foam comes out, this indicates that the nebulizer is not working properly. Sometimes a filter can get clogged, or there might be a problem with the air compressor. Check the instruction booklet that came with your nebulizer. It should tell you how to fix problems or where to call for help. You should have at least one extra nebulizer at home. That way, you will always have one when you need it.   HOW TO PREPARE BEFORE USING THE NEBULIZER  Take these steps before using the nebulizer:  1. Check your medicine. Make sure it has not expired and is not damaged in any way.    2. Wash your hands with soap and water.    3. Put all the parts of your nebulizer on a sturdy, flat surface. Make sure the tubing connects the compressor and the nebulizer.  4. Measure the liquid medicine according to your health care provider's instructions. Pour it into the nebulizer.  5. Attach the mouthpiece or mask.    6. Test the nebulizer by turning it on to make sure a spray is coming out. Then, turn it off.    HOW TO USE THE NEBULIZER  1. Sit down and focus on staying relaxed.    2. If your nebulizer has a mask, put it over your nose and mouth. If you use a mouthpiece, put it in your mouth. Press your lips firmly around the  mouthpiece.  3. Turn on the nebulizer.    4. Breathe out.    5. Some nebulizers have a finger valve. If yours does, cover up the air hole so the air gets to the nebulizer.  6. Once the medicine begins to mist out, take slow, deep breaths. If there is a finger valve, release it at the end of your breath.  7. Continue taking slow, deep breaths until the nebulizer is empty.    Be sure to stop the machine at any point if you start coughing or if the medicine foams or bubbles.  HOW TO CLEAN THE NEBULIZER   The nebulizer and all its parts must be kept very clean. Follow the manufacturer's instructions for cleaning. For most nebulizers, you should follow these guidelines:  · Wash the nebulizer after each use. Use warm water and soap. Rinse it well. Shake the nebulizer to remove extra water. Put it on a clean towel until it is completely dry. To make sure it is dry, put the nebulizer back together. Turn on the compressor for a few minutes. This will blow air through the nebulizer.    · Do not wash the tubing or the finger valve.    · Store the nebulizer in a dust-free place.    · Inspect the filter every week. Replace it any time it looks dirty.    · Sometimes the   nebulizer will need a more complete cleaning. The instruction booklet should say how often you need to do this.  SEEK MEDICAL CARE IF:   · You continue to have difficulty breathing.    · You have trouble using the nebulizer.       This information is not intended to replace advice given to you by your health care provider. Make sure you discuss any questions you have with your health care provider.     Document Released: 06/15/2009 Document Revised: 07/18/2014 Document Reviewed: 12/17/2012  Elsevier Interactive Patient Education ©2016 Elsevier Inc.

## 2015-06-23 ENCOUNTER — Telehealth: Payer: Self-pay

## 2015-06-23 LAB — CBC WITH DIFFERENTIAL/PLATELET
BASOS: 1 %
Basophils Absolute: 0.1 10*3/uL (ref 0.0–0.2)
EOS (ABSOLUTE): 0.6 10*3/uL — AB (ref 0.0–0.4)
Eos: 5 %
Hematocrit: 41.1 % (ref 34.0–46.6)
Hemoglobin: 14.6 g/dL (ref 11.1–15.9)
IMMATURE GRANULOCYTES: 0 %
Immature Grans (Abs): 0 10*3/uL (ref 0.0–0.1)
Lymphocytes Absolute: 1.8 10*3/uL (ref 0.7–3.1)
Lymphs: 16 %
MCH: 32.2 pg (ref 26.6–33.0)
MCHC: 35.5 g/dL (ref 31.5–35.7)
MCV: 91 fL (ref 79–97)
MONOS ABS: 1 10*3/uL — AB (ref 0.1–0.9)
Monocytes: 9 %
NEUTROS PCT: 69 %
Neutrophils Absolute: 7.8 10*3/uL — ABNORMAL HIGH (ref 1.4–7.0)
PLATELETS: 187 10*3/uL (ref 150–379)
RBC: 4.54 x10E6/uL (ref 3.77–5.28)
RDW: 13 % (ref 12.3–15.4)
WBC: 11.2 10*3/uL — AB (ref 3.4–10.8)

## 2015-06-23 NOTE — Telephone Encounter (Signed)
Patient advised as directed below. Patient verbalized understanding.  

## 2015-06-23 NOTE — Telephone Encounter (Signed)
-----   Message from Margo Common, [place] sent at 06/22/2015  6:11 PM EST ----- No acute cardiopulmonary disease on x-ray. Awaiting lab reports.

## 2015-06-25 ENCOUNTER — Other Ambulatory Visit: Payer: Self-pay

## 2015-06-26 ENCOUNTER — Telehealth: Payer: Self-pay

## 2015-06-26 NOTE — Telephone Encounter (Signed)
Patient advised as directed below. Patient has a follow up appointment scheduled for Monday.

## 2015-06-26 NOTE — Telephone Encounter (Signed)
-----   Message from Margo Common, [place] sent at 06/25/2015  4:57 PM EST ----- Chest x-ray did not show any cardiopulmonary diseases. WBC count better than a month ago. If no better in 5 days with present regimen, will need pulmonology referral.

## 2015-06-29 ENCOUNTER — Encounter: Payer: Self-pay | Admitting: Family Medicine

## 2015-06-29 ENCOUNTER — Ambulatory Visit (INDEPENDENT_AMBULATORY_CARE_PROVIDER_SITE_OTHER): Payer: Commercial Managed Care - HMO | Admitting: Family Medicine

## 2015-06-29 ENCOUNTER — Other Ambulatory Visit: Payer: Self-pay | Admitting: Family Medicine

## 2015-06-29 VITALS — BP 120/70 | HR 62 | Temp 97.7°F | Resp 20 | Ht 65.0 in | Wt 165.0 lb

## 2015-06-29 DIAGNOSIS — E7800 Pure hypercholesterolemia, unspecified: Secondary | ICD-10-CM

## 2015-06-29 DIAGNOSIS — J209 Acute bronchitis, unspecified: Secondary | ICD-10-CM

## 2015-06-29 DIAGNOSIS — J40 Bronchitis, not specified as acute or chronic: Secondary | ICD-10-CM

## 2015-06-29 DIAGNOSIS — I10 Essential (primary) hypertension: Secondary | ICD-10-CM

## 2015-06-29 MED ORDER — VENTOLIN HFA 108 (90 BASE) MCG/ACT IN AERS: 2.0000 | INHALATION_SPRAY | Freq: Four times a day (QID) | RESPIRATORY_TRACT | Status: DC | PRN

## 2015-06-29 NOTE — Progress Notes (Signed)
Patient ID: Amanda Strickland, female   DOB: 10-24-1930, 81 y.o.   MRN: [id number]        [name] Female    DOB: 1930-12-25   81 y.o.   MRN: [id number] Visit Date: 06/29/2015  Today's Provider: Vernie Murders, PA   Chief Complaint  Patient presents with  . Follow-up    bronchitis   Subjective:    HPI  Follow up for brochitis  The patient was last seen for this 1 weeks ago. Changes made at last visit include starting Levaquin and prednisone.  She reports excellent compliance with treatment. She feels that condition is Improved. She is not having side effects.   ------------------------------------------------------------------------------------  Patient Active Problem List   Diagnosis Date Noted  . Chronic kidney disease (CKD), stage III (moderate) 05/25/2015  . Congestive heart failure due to high blood pressure ([place]) 05/25/2015  . Fasciculation 05/25/2015  . Blood glucose elevated 05/25/2015  . OP (osteoporosis) 05/25/2015  . Leg varices 05/25/2015  . Chronic diastolic heart failure ([place]) 11/19/2014  . HTN (hypertension) 11/19/2014  . Dizziness 11/19/2014  . Arteriosclerosis of bypass graft of coronary artery 01/29/2014  . History of colon polyps 02/18/2010  . Benign hypertension 03/30/2009  . Arteriosclerosis of coronary artery 03/30/2009  . Hypercholesteremia 03/30/2009   Past Surgical History  Procedure Laterality Date  . Coronary artery bypass graft  2001   Family History  Problem Relation Age of Onset  . Heart attack Father   . Breast cancer Maternal Aunt    No Known Allergies   Previous Medications   ALBUTEROL (PROVENTIL) (2.5 MG/3ML) 0.083% NEBULIZER SOLUTION    Take 3 mLs (2.5 mg total) by nebulization every 6 (six) hours as needed for wheezing or shortness of breath.   AMLODIPINE (NORVASC) 5 MG TABLET    TAKE 1 TABLET EVERY DAY FOR BLOOD PRESSURE   ASPIRIN 81 MG TABLET    Take 81 mg by mouth daily.   BENAZEPRIL-HYDROCHLORTHIAZIDE (LOTENSIN HCT) 20-12.5 MG TABLET    TAKE 2 TABLETS EVERY DAY  FOR  BLOOD  PRESSURE   BUDESONIDE-FORMOTEROL (SYMBICORT) 160-4.5 MCG/ACT INHALER    Inhale 2 puffs into the lungs 2 (two) times daily.   CALCIUM CARBONATE-VIT D-MIN PO    Take 1 tablet by mouth daily.    FUROSEMIDE (LASIX) 40 MG TABLET    Take 40 mg by mouth. As needed   GUAIFENESIN-CODEINE 100-10 MG/5ML SYRUP    Take 5 mLs by mouth every 6 (six) hours as needed for cough.   L-LYSINE 500 MG CAPS    Take by mouth.   MENTHOL-METHYL SALICYLATE (MUSCLE RUB EX)       METOPROLOL SUCCINATE (TOPROL-XL) 50 MG 24 HR TABLET    TAKE 1 TABLET DAILY   MISC NATURAL PRODUCTS (OSTEO BI-FLEX ADV JOINT SHIELD) TABS    Take by mouth.   MULTIPLE VITAMIN (MULTIVITAMIN) CAPSULE    Take 1 capsule by mouth daily.   OMEGA-3 FATTY ACIDS (FISH OIL) 1000 MG CAPS    Take 1 capsule by mouth daily.   POTASSIUM CHLORIDE SA (K-DUR,KLOR-CON) 20 MEQ TABLET    Take 20 mEq by mouth as needed.    SPACER/AERO-HOLDING CHAMBERS (AEROCHAMBER MINI CHAMBER) DEVI    Use spacer with inhalers.   VENTOLIN HFA 108 (90 BASE) MCG/ACT INHALER    Inhale 2 puffs into the lungs every 6 (six) hours as needed. As needed   VITAMIN E 100 UNIT CAPSULE    Take 100 Units by mouth  daily.    Review of Systems  Constitutional: Negative.   HENT: Positive for congestion.   Respiratory: Positive for cough, shortness of breath and wheezing.   Cardiovascular: Negative.     Social History  Substance Use Topics  . Smoking status: Never Smoker   . Smokeless tobacco: Never Used  . Alcohol Use: No   Objective:   BP 120/70 mmHg  Pulse 62  Temp(Src) 97.7 F (36.5 C) (Oral)  Resp 20  Ht 5\' 5"  (1.651 m)  Wt 165 lb (74.844 kg)  BMI 27.46 kg/m2  SpO2 96% Wt Readings from Last 3 Encounters:  06/29/15 165 lb (74.844 kg)  06/22/15 168 lb 9.6 oz (76.476 kg)  06/08/15 170 lb 9.6 oz (77.384 kg)    Physical Exam  Constitutional: She is oriented to person, place, and  time. She appears well-developed and well-nourished.  Eyes: Conjunctivae are normal.  Neck: Neck supple.  Cardiovascular: Normal rate.   Pulmonary/Chest: Effort normal.  Slight squeak in mid chest that clears with coughing.  Neurological: She is alert and oriented to person, place, and time.      Assessment & Plan:     1. Bronchitis with bronchospasm Much improved with only slight wheeze and occasional greenish sputum. Last blood tests showed WBC count down from 12,900 to 11,200, neutrophils back to normal and CXR showed no acute cardiopulmonary disease on 06-22-15. Will continue Ventolin HFA prn rescue with Albuterol by nebulizer at home BID. Continue Symbicort BID daily and recheck as needed. - VENTOLIN HFA 108 (90 BASE) MCG/ACT inhaler; Inhale 2 puffs into the lungs every 6 (six) hours as needed. As needed  Dispense: 3 Inhaler; Refill: [place]

## 2015-07-14 ENCOUNTER — Other Ambulatory Visit: Payer: Self-pay | Admitting: Family Medicine

## 2015-07-14 DIAGNOSIS — J209 Acute bronchitis, unspecified: Secondary | ICD-10-CM

## 2015-07-14 MED ORDER — VENTOLIN HFA 108 (90 BASE) MCG/ACT IN AERS: 2.0000 | INHALATION_SPRAY | Freq: Four times a day (QID) | RESPIRATORY_TRACT | Status: DC | PRN

## 2015-07-14 NOTE — Telephone Encounter (Signed)
Pt daughter, Cecille Rubin called stating pt received a written Rx for VENTOLIN HFA 108 (90 BASE) MCG/ACT inhaler. She is requesting this sent to [facility] mail order.  CB#[phone]

## 2015-07-14 NOTE — Telephone Encounter (Signed)
Sent prescription electronically to [facility]. Usually write the prescription so patient can mail it in with the remainder of information the mail order pharmacy will need to get it to their home (such as correct address and credit card needed for shipping charges).

## 2015-07-15 NOTE — Telephone Encounter (Signed)
Patient's daughter Cecille Rubin advised as directed below. Cecille Rubin states [facility] faxing the refill that the patient receives the medication much faster, and [employer] has patient's shipping information on file.

## 2015-09-28 ENCOUNTER — Encounter: Payer: Self-pay | Admitting: Family Medicine

## 2015-10-15 ENCOUNTER — Encounter: Payer: Self-pay | Admitting: Family Medicine

## 2015-10-15 ENCOUNTER — Ambulatory Visit (INDEPENDENT_AMBULATORY_CARE_PROVIDER_SITE_OTHER): Payer: PPO | Admitting: Family Medicine

## 2015-10-15 VITALS — BP 128/76 | HR 64 | Temp 97.7°F | Resp 16 | Ht 65.0 in | Wt 166.0 lb

## 2015-10-15 DIAGNOSIS — Z1239 Encounter for other screening for malignant neoplasm of breast: Secondary | ICD-10-CM | POA: Diagnosis not present

## 2015-10-15 DIAGNOSIS — Z1211 Encounter for screening for malignant neoplasm of colon: Secondary | ICD-10-CM

## 2015-10-15 DIAGNOSIS — J441 Chronic obstructive pulmonary disease with (acute) exacerbation: Secondary | ICD-10-CM

## 2015-10-15 DIAGNOSIS — N183 Chronic kidney disease, stage 3 unspecified: Secondary | ICD-10-CM

## 2015-10-15 DIAGNOSIS — I129 Hypertensive chronic kidney disease with stage 1 through stage 4 chronic kidney disease, or unspecified chronic kidney disease: Secondary | ICD-10-CM

## 2015-10-15 DIAGNOSIS — I2510 Atherosclerotic heart disease of native coronary artery without angina pectoris: Secondary | ICD-10-CM | POA: Diagnosis not present

## 2015-10-15 DIAGNOSIS — J449 Chronic obstructive pulmonary disease, unspecified: Secondary | ICD-10-CM | POA: Insufficient documentation

## 2015-10-15 DIAGNOSIS — I110 Hypertensive heart disease with heart failure: Secondary | ICD-10-CM

## 2015-10-15 DIAGNOSIS — E7800 Pure hypercholesterolemia, unspecified: Secondary | ICD-10-CM | POA: Diagnosis not present

## 2015-10-15 DIAGNOSIS — Z0000 Encounter for general adult medical examination without abnormal findings: Secondary | ICD-10-CM

## 2015-10-15 DIAGNOSIS — R739 Hyperglycemia, unspecified: Secondary | ICD-10-CM

## 2015-10-15 MED ORDER — FLUTICASONE PROPIONATE 50 MCG/ACT NA SUSP
2.0000 | Freq: Every day | NASAL | Status: DC
Start: 2015-10-15 — End: 2016-08-01

## 2015-10-15 MED ORDER — AZITHROMYCIN 250 MG PO TABS: ORAL_TABLET | ORAL | Status: DC

## 2015-10-15 MED ORDER — PREDNISONE 10 MG PO TABS: ORAL_TABLET | ORAL | Status: DC

## 2015-10-15 NOTE — Progress Notes (Signed)
Patient ID: Amanda Strickland, female   DOB: [birth year], 82 y.o.   MRN: [id number]       [name], Female    DOB: 06-02-1931, 82 y.o.   MRN: [id number] Visit Date: 10/15/2015  Today's Provider: Margarita Rana, MD   Chief Complaint  Patient presents with  . Medicare Wellness   Subjective:    Annual wellness visit Amanda Strickland is a 82 y.o. female. She feels well. She reports exercising daily. She reports she is sleeping well.  Taking her medication as recommended. Here with her daughter today.   UTD on health maintenance as noted.    05/12/14 CPE 06/30/14 Mammogram-BI-RADS 1 06/30/14 BMD-osteoporsis 01/19/10 Colonoscopy-polyp, diverticulosis, recheck in 5 yrs Dr. Candace Cruise positive for tubular adenoma  Also with increased cough, SOB and wheezing over the past few days. Worsening. Did have COPD exacerbation last winter and got better with treatment. Was exposed to grandkids with URI.  No fever. Still active, just not a baseline.  -----------------------------------------------------------   Review of Systems  Constitutional: Negative.   HENT: Positive for congestion and rhinorrhea.   Eyes: Negative.   Respiratory: Positive for cough, shortness of breath and wheezing.   Cardiovascular: Negative.   Gastrointestinal: Negative.   Endocrine: Negative.   Genitourinary: Negative.   Musculoskeletal: Negative.   Skin: Negative.   Allergic/Immunologic: Negative.   Neurological: Negative.   Hematological: Negative.   Psychiatric/Behavioral: Negative.     Social History   Social History  . Marital Status: Widowed    Spouse Name: N/A  . Number of Children: N/A  . Years of Education: N/A   Occupational History  . Not on file.   Social History Main Topics  . Smoking status: Never Smoker   . Smokeless tobacco: Never Used  . Alcohol Use: No  . Drug Use: No  . Sexual Activity: Not on file   Other Topics Concern  . Not on file   Social History Narrative     Past Medical History  Diagnosis Date  . CHF (congestive heart failure) ([place])   . Hypertension   . Heart attack ([place]) 1994  . Depression   . Arthritis      Patient Active Problem List   Diagnosis Date Noted  . Chronic kidney disease (CKD), stage III (moderate) 05/25/2015  . Congestive heart failure due to high blood pressure ([place]) 05/25/2015  . Fasciculation 05/25/2015  . Blood glucose elevated 05/25/2015  . OP (osteoporosis) 05/25/2015  . Leg varices 05/25/2015  . Chronic diastolic heart failure ([place]) 11/19/2014  . HTN (hypertension) 11/19/2014  . Dizziness 11/19/2014  . Arteriosclerosis of bypass graft of coronary artery 01/29/2014  . History of colon polyps 02/18/2010  . Benign hypertension 03/30/2009  . Arteriosclerosis of coronary artery 03/30/2009  . Hypercholesteremia 03/30/2009    Past Surgical History  Procedure Laterality Date  . Coronary artery bypass graft  2001    Her family history includes Breast cancer in her maternal aunt; Heart attack in her father.    Previous Medications   ALBUTEROL (PROVENTIL) (2.5 MG/3ML) 0.083% NEBULIZER SOLUTION    Take 3 mLs (2.5 mg total) by nebulization every 6 (six) hours as needed for wheezing or shortness of breath.   AMLODIPINE (NORVASC) 5 MG TABLET    TAKE 1 TABLET EVERY DAY FOR BLOOD PRESSURE   ASPIRIN 81 MG TABLET    Take 81 mg by mouth daily.   BENAZEPRIL-HYDROCHLORTHIAZIDE (LOTENSIN HCT) 20-12.5 MG TABLET    TAKE 2 TABLETS EVERY DAY  FOR  BLOOD  PRESSURE   BUDESONIDE-FORMOTEROL (SYMBICORT) 160-4.5 MCG/ACT INHALER    Inhale 2 puffs into the lungs 2 (two) times daily.   CALCIUM CARBONATE-VIT D-MIN PO    Take 1 tablet by mouth daily.    FUROSEMIDE (LASIX) 40 MG TABLET    Take 40 mg by mouth. As needed   L-LYSINE 500 MG CAPS    Take by mouth. Reported on 10/15/2015   MENTHOL-METHYL SALICYLATE (MUSCLE RUB EX)       METOPROLOL SUCCINATE (TOPROL-XL) 50 MG 24 HR TABLET    TAKE 1 TABLET DAILY   MISC NATURAL PRODUCTS (OSTEO  BI-FLEX ADV JOINT SHIELD) TABS    Take by mouth.   MULTIPLE VITAMIN (MULTIVITAMIN) CAPSULE    Take 1 capsule by mouth daily.   OMEGA-3 FATTY ACIDS (FISH OIL) 1000 MG CAPS    Take 1 capsule by mouth daily.   POTASSIUM CHLORIDE SA (K-DUR,KLOR-CON) 20 MEQ TABLET    Take 20 mEq by mouth as needed.    SPACER/AERO-HOLDING CHAMBERS (AEROCHAMBER MINI CHAMBER) DEVI    Use spacer with inhalers.   VENTOLIN HFA 108 (90 BASE) MCG/ACT INHALER    Inhale 2 puffs into the lungs every 6 (six) hours as needed. As needed   VITAMIN E 100 UNIT CAPSULE    Take 100 Units by mouth daily.    Patient Care Team: Margarita Rana, MD as PCP - General (Family Medicine) Teodoro Spray, MD as Consulting Physician (Cardiology)     Objective:   Vitals: BP 128/76 mmHg  Pulse 64  Temp(Src) 97.7 F (36.5 C) (Oral)  Resp 16  Ht 5\' 5"  (1.651 m)  Wt 166 lb (75.297 kg)  BMI 27.62 kg/m2  SpO2 97%  Physical Exam  Constitutional: She is oriented to person, place, and time. She appears well-developed and well-nourished.  HENT:  Head: Normocephalic and atraumatic.  Right Ear: Tympanic membrane, external ear and ear canal normal.  Left Ear: Tympanic membrane, external ear and ear canal normal.  Nose: Nose normal.  Mouth/Throat: Uvula is midline, oropharynx is clear and moist and mucous membranes are normal.  Eyes: Conjunctivae, EOM and lids are normal. Pupils are equal, round, and reactive to light.  Neck: Trachea normal and normal range of motion. Neck supple. Carotid bruit is not present. No thyroid mass and no thyromegaly present.  Cardiovascular: Normal rate, regular rhythm and normal heart sounds.   Pulmonary/Chest: Effort normal and breath sounds normal.  Scattered inspiratory wheeze   Abdominal: Soft. Normal appearance and bowel sounds are normal. There is no hepatosplenomegaly. There is no tenderness.  Musculoskeletal: Normal range of motion.  Lymphadenopathy:    She has no cervical adenopathy.    She has no  axillary adenopathy.  Neurological: She is alert and oriented to person, place, and time. She has normal strength. No cranial nerve deficit.  Skin: Skin is warm, dry and intact. There is erythema.  Psychiatric: She has a normal mood and affect. Her speech is normal and behavior is normal. Judgment and thought content normal. Cognition and memory are normal.    Activities of Daily Living In your present state of health, do you have any difficulty performing the following activities: 10/15/2015 06/29/2015  Hearing? N N  Vision? N N  Difficulty concentrating or making decisions? N N  Walking or climbing stairs? N N  Dressing or bathing? N N  Doing errands, shopping? N N    Fall Risk Assessment Fall Risk  10/15/2015 06/29/2015  Falls in the  past year? No No  Injury with Fall? - No     Depression Screen PHQ 2/9 Scores 10/15/2015 06/29/2015  PHQ - 2 Score 0 0    Cognitive Testing - 6-CIT  Correct? Score   What year is it? yes 0 0 or 4  What month is it? yes 0 0 or 3  Memorize:    Pia Mau,  44,  [address]  [place],      What time is it? (within 1 hour) yes 0 0 or 3  Count backwards from 20 yes 0 0, 2, or 4  Name the months of the year yes 0 0, 2, or 4  Repeat name & address above yes 0 0, 2, 4, 6, 8, or 10       TOTAL SCORE  0/28   Interpretation:  Normal  Normal (0-7) Abnormal (8-28)       Assessment & Plan:     Annual Wellness Visit  Reviewed patient's Family Medical History Reviewed and updated list of patient's medical providers Assessment of cognitive impairment was done Assessed patient's functional ability Established a written schedule for health screening [place] Completed and Reviewed  Exercise Activities and Dietary recommendations Goals    None      Immunization History  Administered Date(s) Administered  . Influenza,inj,Quad PF,36+ Mos 05/12/2015  . Pneumococcal Conjugate-13 05/12/2014  . Td 09/13/2012  . Tdap 09/13/2012    1. Medicare annual wellness visit, subsequent Stable. Continue current medication, plan of care. Here with daughter, very supportive.    2. Arteriosclerosis of coronary artery Stable. Will check labs.   - CBC with Differential/Platelet  3. Congestive heart failure due to high blood pressure (HCC) Stable. No pulmonary edema audible today.   4. Benign hypertension with CKD (chronic kidney disease) stage III Condition is stable. Please continue current medication and  plan of care as noted.  Will check labs.   - Comprehensive metabolic panel  5. Blood glucose elevated - Hemoglobin A1c  6. Hypercholesteremia - Lipid Panel With LDL/HDL Ratio - TSH  7. Breast cancer screening Will schedule.  - MM DIGITAL SCREENING BILATERAL; Future  8. Colon cancer screening With advancing age and pulmonary issues, will check Cologuard before ordering colonoscopy.  - Cologuard  9. COPD exacerbation ([place]) Recurrent problem. Worsening. Will treat with prednisone and Zpak, sent to pharmacy.   Patient instructed to call back if condition worsens or does not improve.     Patient was seen and examined by Jerrell Belfast, MD, and note scribed by Lynford Humphrey, [place].   I have reviewed the document for accuracy and completeness and I agree with above. Jerrell Belfast, MD   Margarita Rana, MD     ------------------------------------------------------------------------------------------------------------

## 2015-10-15 NOTE — Patient Instructions (Signed)
Please call the [facility] at [facility] to schedule this at [phone]   

## 2015-10-19 DIAGNOSIS — I129 Hypertensive chronic kidney disease with stage 1 through stage 4 chronic kidney disease, or unspecified chronic kidney disease: Secondary | ICD-10-CM | POA: Diagnosis not present

## 2015-10-19 DIAGNOSIS — R739 Hyperglycemia, unspecified: Secondary | ICD-10-CM | POA: Diagnosis not present

## 2015-10-19 DIAGNOSIS — N183 Chronic kidney disease, stage 3 (moderate): Secondary | ICD-10-CM | POA: Diagnosis not present

## 2015-10-19 DIAGNOSIS — E7800 Pure hypercholesterolemia, unspecified: Secondary | ICD-10-CM | POA: Diagnosis not present

## 2015-10-19 DIAGNOSIS — I2510 Atherosclerotic heart disease of native coronary artery without angina pectoris: Secondary | ICD-10-CM | POA: Diagnosis not present

## 2015-10-20 LAB — CBC WITH DIFFERENTIAL/PLATELET
BASOS ABS: 0 10*3/uL (ref 0.0–0.2)
Basos: 0 %
EOS (ABSOLUTE): 0.1 10*3/uL (ref 0.0–0.4)
EOS: 1 %
HEMATOCRIT: 41.9 % (ref 34.0–46.6)
HEMOGLOBIN: 14.5 g/dL (ref 11.1–15.9)
IMMATURE GRANS (ABS): 0.1 10*3/uL (ref 0.0–0.1)
Immature Granulocytes: 1 %
LYMPHS ABS: 2.1 10*3/uL (ref 0.7–3.1)
LYMPHS: 16 %
MCH: 31.8 pg (ref 26.6–33.0)
MCHC: 34.6 g/dL (ref 31.5–35.7)
MCV: 92 fL (ref 79–97)
MONOCYTES: 7 %
Monocytes Absolute: 1 10*3/uL — ABNORMAL HIGH (ref 0.1–0.9)
NEUTROS ABS: 10.1 10*3/uL — AB (ref 1.4–7.0)
Neutrophils: 75 %
Platelets: 182 10*3/uL (ref 150–379)
RBC: 4.56 x10E6/uL (ref 3.77–5.28)
RDW: 13.1 % (ref 12.3–15.4)
WBC: 13.3 10*3/uL — ABNORMAL HIGH (ref 3.4–10.8)

## 2015-10-20 LAB — COMPREHENSIVE METABOLIC PANEL
A/G RATIO: 1.8 (ref 1.2–2.2)
ALBUMIN: 4.5 g/dL (ref 3.5–4.7)
ALK PHOS: 88 IU/L (ref 39–117)
ALT: 35 IU/L — ABNORMAL HIGH (ref 0–32)
AST: 36 IU/L (ref 0–40)
BILIRUBIN TOTAL: 0.7 mg/dL (ref 0.0–1.2)
BUN / CREAT RATIO: 18 (ref 12–28)
BUN: 15 mg/dL (ref 8–27)
CHLORIDE: 98 mmol/L (ref 96–106)
CO2: 28 mmol/L (ref 18–29)
Calcium: 9.8 mg/dL (ref 8.7–10.3)
Creatinine, Ser: 0.84 mg/dL (ref 0.57–1.00)
GFR calc non Af Amer: 65 mL/min/{1.73_m2} (ref >59)
GFR, EST AFRICAN AMERICAN: 75 mL/min/{1.73_m2} (ref >59)
GLUCOSE: 125 mg/dL — AB (ref 65–99)
Globulin, Total: 2.5 g/dL (ref 1.5–4.5)
POTASSIUM: 3.9 mmol/L (ref 3.5–5.2)
SODIUM: 143 mmol/L (ref 134–144)
TOTAL PROTEIN: 7 g/dL (ref 6.0–8.5)

## 2015-10-20 LAB — LIPID PANEL WITH LDL/HDL RATIO
CHOLESTEROL TOTAL: 174 mg/dL (ref 100–199)
HDL: 83 mg/dL (ref >39)
LDL Calculated: 67 mg/dL (ref 0–99)
LDl/HDL Ratio: 0.8 ratio units (ref 0.0–3.2)
Triglycerides: 121 mg/dL (ref 0–149)
VLDL CHOLESTEROL CAL: 24 mg/dL (ref 5–40)

## 2015-10-20 LAB — TSH: TSH: 1.59 u[IU]/mL (ref 0.450–4.500)

## 2015-10-20 LAB — HEMOGLOBIN A1C
ESTIMATED AVERAGE GLUCOSE: 131 mg/dL
HEMOGLOBIN A1C: 6.2 % — AB (ref 4.8–5.6)

## 2015-10-30 ENCOUNTER — Other Ambulatory Visit: Payer: Self-pay

## 2015-10-30 DIAGNOSIS — I10 Essential (primary) hypertension: Secondary | ICD-10-CM

## 2015-10-30 DIAGNOSIS — J209 Acute bronchitis, unspecified: Secondary | ICD-10-CM

## 2015-10-30 MED ORDER — AMLODIPINE BESYLATE 5 MG PO TABS: 5.0000 mg | ORAL_TABLET | Freq: Every day | ORAL | Status: DC

## 2015-10-30 MED ORDER — METOPROLOL SUCCINATE ER 50 MG PO TB24: 50.0000 mg | ORAL_TABLET | Freq: Every day | ORAL | Status: DC

## 2015-10-30 MED ORDER — ALBUTEROL SULFATE (2.5 MG/3ML) 0.083% IN NEBU: 2.5000 mg | INHALATION_SOLUTION | Freq: Four times a day (QID) | RESPIRATORY_TRACT | Status: DC | PRN

## 2015-10-30 MED ORDER — BENAZEPRIL-HYDROCHLOROTHIAZIDE 20-12.5 MG PO TABS: 1.0000 | ORAL_TABLET | Freq: Two times a day (BID) | ORAL | Status: DC

## 2015-10-30 MED ORDER — BUDESONIDE-FORMOTEROL FUMARATE 160-4.5 MCG/ACT IN AERO: 2.0000 | INHALATION_SPRAY | Freq: Two times a day (BID) | RESPIRATORY_TRACT | Status: DC

## 2015-10-30 NOTE — Telephone Encounter (Signed)
Patient called and states due to her insurance she needs her RXs to be faxed to [facility] mail order pharmacy, medications have been pulled down for review, needs 90 day supply-aa

## 2015-11-03 ENCOUNTER — Telehealth: Payer: Self-pay | Admitting: Family Medicine

## 2015-11-03 NOTE — Telephone Encounter (Signed)
Hope with [facility] needs clarification on the albuterol (PROVENTIL) (2.5 MG/3ML) 0.083% nebulizer solution  Her call back is - [phone]  Thanks Con Memos

## 2015-11-04 NOTE — Telephone Encounter (Signed)
LMTCB 11/04/2015  Thanks,   -Mickel Baas

## 2015-11-04 NOTE — Telephone Encounter (Signed)
Amanda Strickland with [facility] pharmacy returned Amanda Strickland's call. There question is if 150 ML of Albuterol nebulizer solution is enough for a 90 day supply. Please advise. CB# [phone]

## 2015-11-05 NOTE — Telephone Encounter (Signed)
Amanda Strickland, was just making sure that we had called [place] regarding albuterol. sd

## 2015-11-05 NOTE — Telephone Encounter (Signed)
Amanda Strickland pt daughter is requesting a call back.  CB#[phone]/MW

## 2015-11-06 NOTE — Telephone Encounter (Signed)
LMTCB 11/06/2015  Thanks,   -Laura  

## 2015-12-02 IMAGING — CR DG CHEST 2V
1 series · 2 of 2 positions shown · non-contrast
Comparison: Radiograph 01/14/2014

CLINICAL DATA: Cough, sore throat, hoarseness. Symptoms occurring
over 1 month. Initial encounter.

EXAM:
CHEST  2 VIEW

[Series 1: kdxr chest pa (or ap) and lat · 0.14mm/px · 2 of 2 slices shown]
[im 1/2]
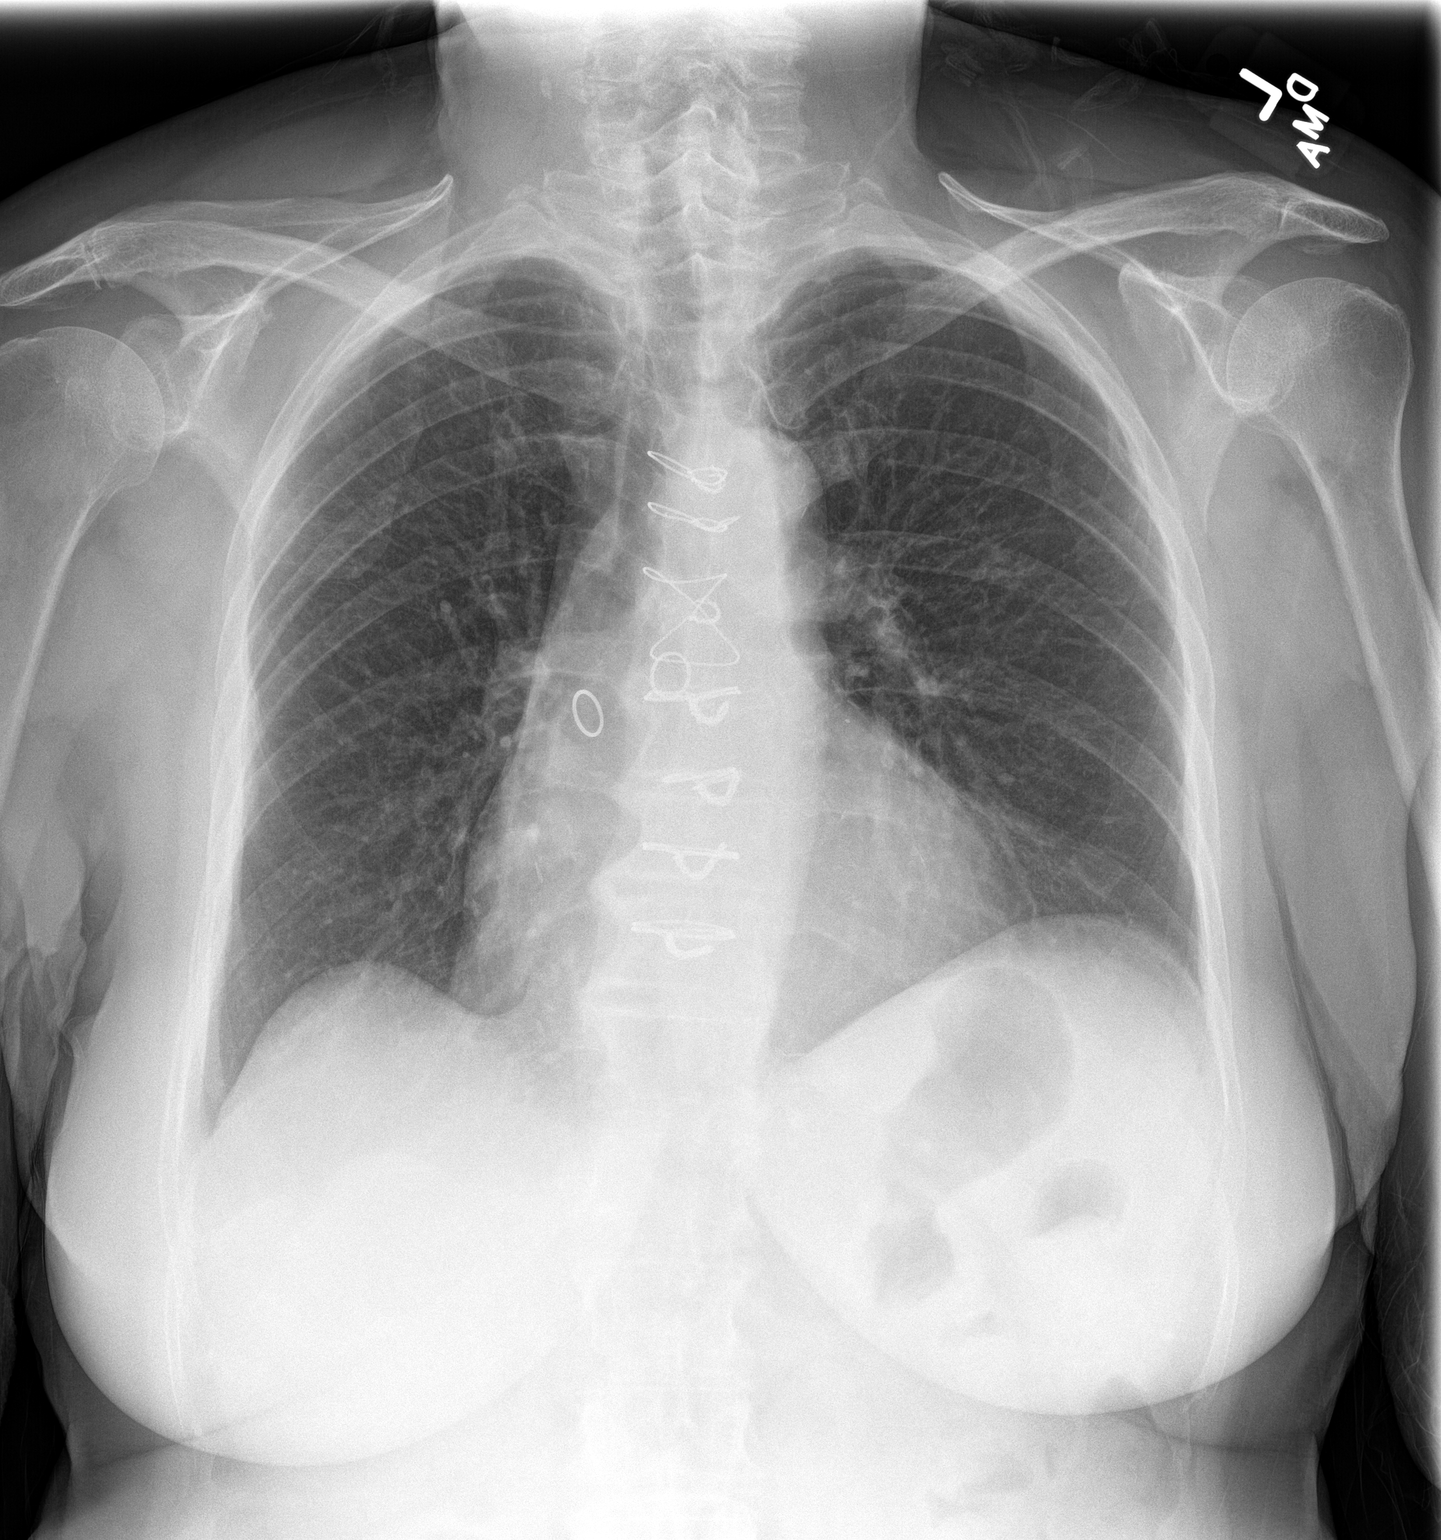
[im 2/2]
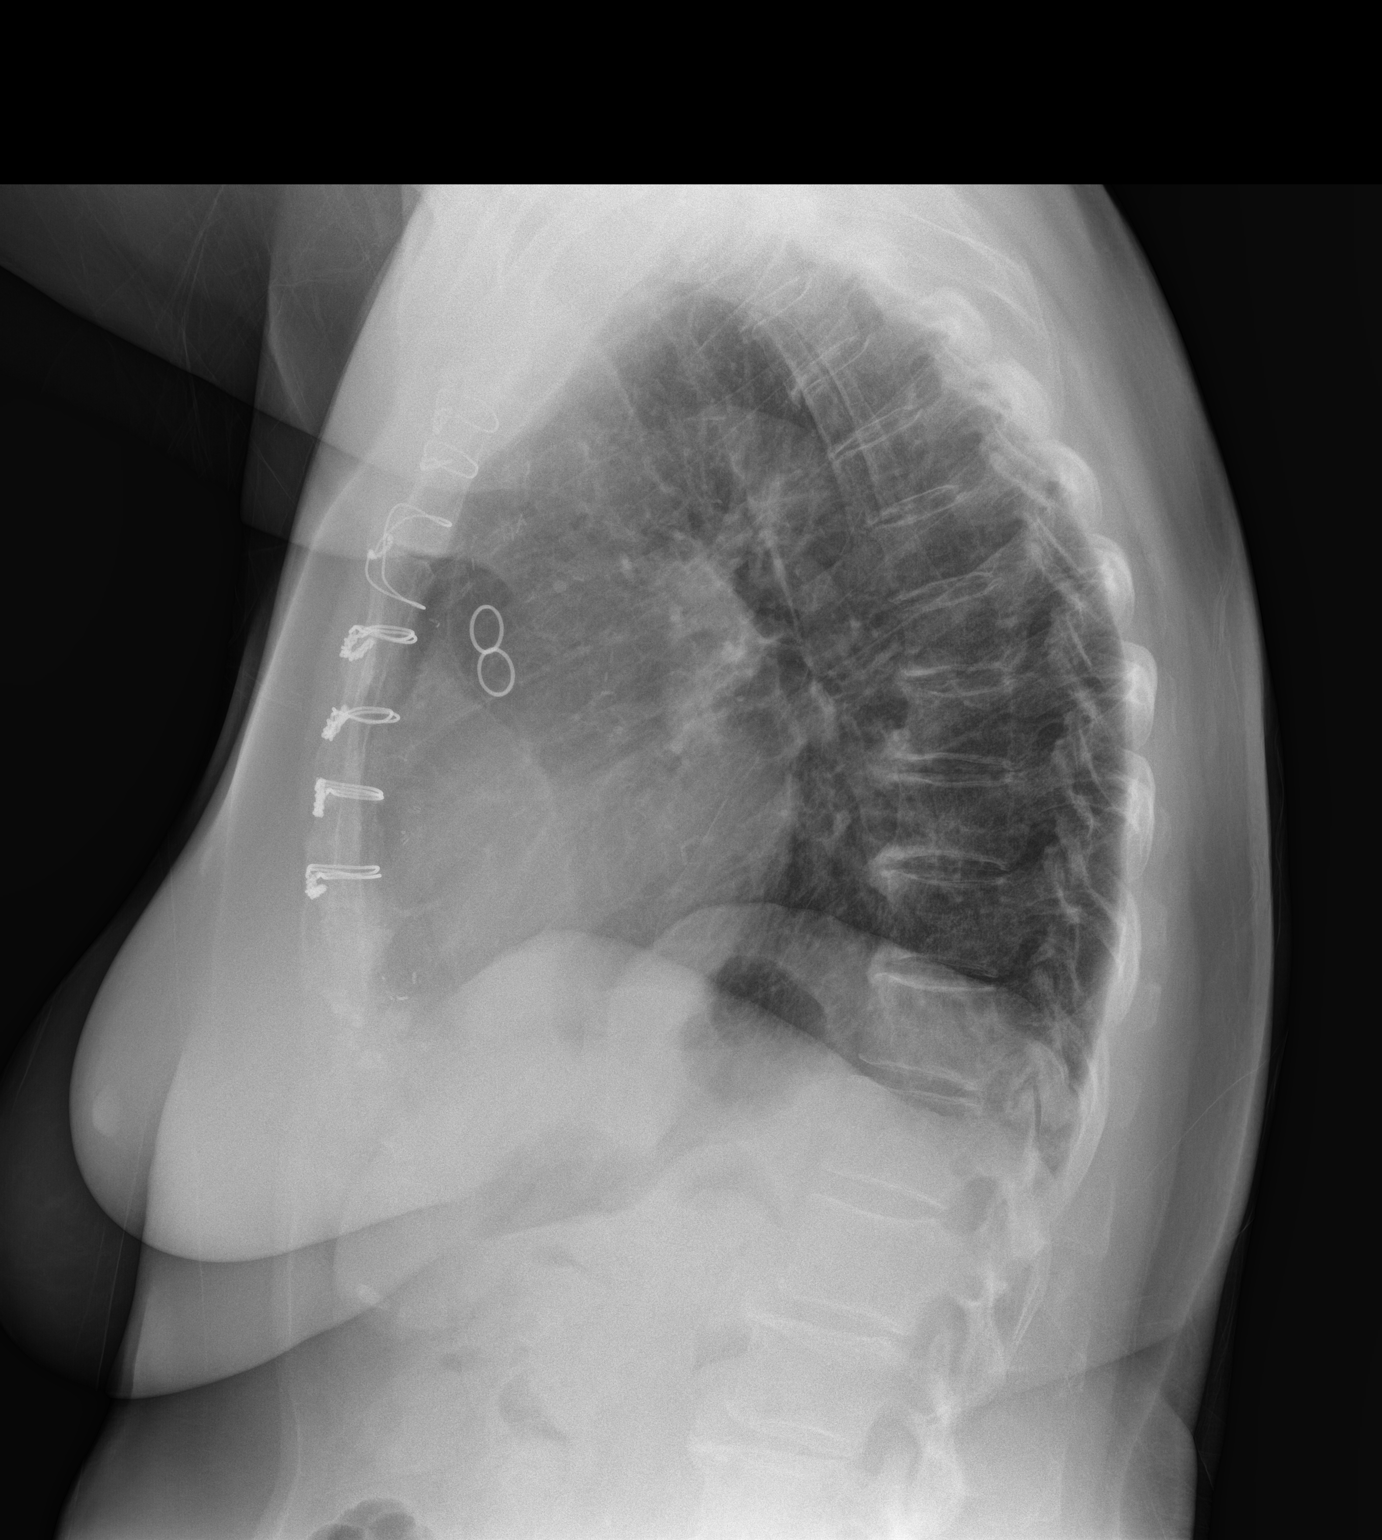

[2 of 2 positions shown; findings below may reference images not displayed]

FINDINGS: Sternotomy wires overlie normal cardiac silhouette. There is
improvement in the interstitial edema pattern is seen on prior. No
effusion, infiltrate, or pneumothorax. Degenerative osteophytosis of
the thoracic spine.
IMPRESSION: 1.  No acute cardiopulmonary process.
2. Resolution of interstitial edema pattern seen on comparison exam

## 2015-12-15 ENCOUNTER — Other Ambulatory Visit: Payer: Self-pay | Admitting: Family Medicine

## 2015-12-15 DIAGNOSIS — E7800 Pure hypercholesterolemia, unspecified: Secondary | ICD-10-CM

## 2015-12-15 NOTE — Telephone Encounter (Signed)
Simvastatin was on her list in December 2016; but has since been removed.  I could not find the reason why.  Thanks,   -Mickel Baas

## 2015-12-15 NOTE — Telephone Encounter (Signed)
Ok to restart and send in new rx.  Thanks.

## 2015-12-15 NOTE — Telephone Encounter (Signed)
Pt telling pharmacy she is taking simvastatin but the pharmacy does not have it listed in her medications.  I didn't see it listed either.  Please advise pharmacy if she is.  Thank sTeri

## 2015-12-16 DIAGNOSIS — L820 Inflamed seborrheic keratosis: Secondary | ICD-10-CM | POA: Diagnosis not present

## 2015-12-16 DIAGNOSIS — L821 Other seborrheic keratosis: Secondary | ICD-10-CM | POA: Diagnosis not present

## 2015-12-16 DIAGNOSIS — L578 Other skin changes due to chronic exposure to nonionizing radiation: Secondary | ICD-10-CM | POA: Diagnosis not present

## 2015-12-16 MED ORDER — SIMVASTATIN 20 MG PO TABS: 20.0000 mg | ORAL_TABLET | Freq: Every day | ORAL | Status: DC

## 2015-12-16 NOTE — Telephone Encounter (Signed)
Sent rx to [facility] and informed pt. Renaldo Fiddler, CMA

## 2016-02-02 ENCOUNTER — Other Ambulatory Visit: Payer: Self-pay | Admitting: Family Medicine

## 2016-02-02 ENCOUNTER — Ambulatory Visit
Admission: RE | Admit: 2016-02-02 | Discharge: 2016-02-02 | Disposition: A | Discharge status: Home / Self Care | Payer: PPO | Source: Ambulatory Visit | Attending: Family Medicine | Admitting: Family Medicine

## 2016-02-02 DIAGNOSIS — Z1231 Encounter for screening mammogram for malignant neoplasm of breast: Secondary | ICD-10-CM | POA: Diagnosis not present

## 2016-02-02 DIAGNOSIS — Z1239 Encounter for other screening for malignant neoplasm of breast: Secondary | ICD-10-CM

## 2016-05-19 ENCOUNTER — Ambulatory Visit (INDEPENDENT_AMBULATORY_CARE_PROVIDER_SITE_OTHER): Payer: PPO

## 2016-05-19 DIAGNOSIS — Z23 Encounter for immunization: Secondary | ICD-10-CM | POA: Diagnosis not present

## 2016-06-15 DIAGNOSIS — H04123 Dry eye syndrome of bilateral lacrimal glands: Secondary | ICD-10-CM | POA: Diagnosis not present

## 2016-07-05 ENCOUNTER — Encounter: Payer: Self-pay | Admitting: Family Medicine

## 2016-07-05 ENCOUNTER — Ambulatory Visit (INDEPENDENT_AMBULATORY_CARE_PROVIDER_SITE_OTHER): Payer: PPO | Admitting: Family Medicine

## 2016-07-05 VITALS — BP 132/68 | HR 68 | Temp 98.5°F | Resp 16 | Wt 167.0 lb

## 2016-07-05 DIAGNOSIS — R05 Cough: Secondary | ICD-10-CM | POA: Diagnosis not present

## 2016-07-05 DIAGNOSIS — J0100 Acute maxillary sinusitis, unspecified: Secondary | ICD-10-CM | POA: Diagnosis not present

## 2016-07-05 DIAGNOSIS — R059 Cough, unspecified: Secondary | ICD-10-CM

## 2016-07-05 DIAGNOSIS — I10 Essential (primary) hypertension: Secondary | ICD-10-CM | POA: Diagnosis not present

## 2016-07-05 LAB — POCT INFLUENZA A/B
INFLUENZA A, POC: NEGATIVE
INFLUENZA B, POC: NEGATIVE

## 2016-07-05 MED ORDER — BENAZEPRIL-HYDROCHLOROTHIAZIDE 20-12.5 MG PO TABS: 1.0000 | ORAL_TABLET | Freq: Two times a day (BID) | ORAL | 1 refills | Status: DC

## 2016-07-05 MED ORDER — METOPROLOL SUCCINATE ER 50 MG PO TB24: 50.0000 mg | ORAL_TABLET | Freq: Every day | ORAL | 1 refills | Status: DC

## 2016-07-05 MED ORDER — AMLODIPINE BESYLATE 5 MG PO TABS: 5.0000 mg | ORAL_TABLET | Freq: Every day | ORAL | 1 refills | Status: DC

## 2016-07-05 MED ORDER — LEVOFLOXACIN 500 MG PO TABS: 500.0000 mg | ORAL_TABLET | Freq: Every day | ORAL | 0 refills | Status: AC

## 2016-07-05 NOTE — Progress Notes (Signed)
[name] Female    DOB: Oct 20, 1930   82 y.o.   MRN: [id number] Visit Date: 07/05/2016  Today's Provider: Lelon Huh, MD   Chief Complaint  Patient presents with  . URI   Subjective:    URI   This is a new problem. The current episode started in the past 7 days. The maximum temperature recorded prior to her arrival was 100.4 - 100.9 F. Associated symptoms include congestion, coughing, headaches, a plugged ear sensation, rhinorrhea, sinus pain and a sore throat. She has tried acetaminophen for the symptoms. The treatment provided no relief.   Temperature around 100 this morning.     No Known Allergies   Current Outpatient Prescriptions:  .  albuterol (PROVENTIL) (2.5 MG/3ML) 0.083% nebulizer solution, Take 3 mLs (2.5 mg total) by nebulization every 6 (six) hours as needed for wheezing or shortness of breath., Disp: 150 mL, Rfl: 1 .  amLODipine (NORVASC) 5 MG tablet, Take 1 tablet (5 mg total) by mouth daily., Disp: 90 tablet, Rfl: 1 .  aspirin 81 MG tablet, Take 81 mg by mouth daily., Disp: , Rfl:  .  benazepril-hydrochlorthiazide (LOTENSIN HCT) 20-12.5 MG tablet, Take 1 tablet by mouth 2 (two) times daily., Disp: 180 tablet, Rfl: 1 .  budesonide-formoterol (SYMBICORT) 160-4.5 MCG/ACT inhaler, Inhale 2 puffs into the lungs 2 (two) times daily., Disp: 3 Inhaler, Rfl: 1 .  CALCIUM CARBONATE-VIT D-MIN PO, Take 1 tablet by mouth daily. , Disp: , Rfl:  .  fluticasone (FLONASE) 50 MCG/ACT nasal spray, Place 2 sprays into both nostrils daily., Disp: 16 g, Rfl: 6 .  furosemide (LASIX) 40 MG tablet, Take 40 mg by mouth. As needed, Disp: , Rfl:  .  L-Lysine 500 MG CAPS, Take by mouth. Reported on 10/15/2015, Disp: , Rfl:  .  Menthol-Methyl Salicylate (MUSCLE RUB EX), , Disp: , Rfl:  .  metoprolol succinate (TOPROL-XL) 50 MG 24 hr tablet, Take 1 tablet (50 mg total) by mouth daily. Take with or immediately following a meal., Disp: 90 tablet, Rfl: 1 .  Misc Natural  Products (OSTEO BI-FLEX ADV JOINT SHIELD) TABS, Take by mouth., Disp: , Rfl:  .  Multiple Vitamin (MULTIVITAMIN) capsule, Take 1 capsule by mouth daily., Disp: , Rfl:  .  Omega-3 Fatty Acids (FISH OIL) 1000 MG CAPS, Take 1 capsule by mouth daily., Disp: , Rfl:  .  potassium chloride SA (K-DUR,KLOR-CON) 20 MEQ tablet, Take 20 mEq by mouth as needed. , Disp: , Rfl:  .  simvastatin (ZOCOR) 20 MG tablet, Take 1 tablet (20 mg total) by mouth at bedtime., Disp: 90 tablet, Rfl: 3 .  Spacer/Aero-Holding Chambers (AEROCHAMBER MINI CHAMBER) DEVI, Use spacer with inhalers., Disp: 1 Device, Rfl: 0 .  VENTOLIN HFA 108 (90 Base) MCG/ACT inhaler, Inhale 2 puffs into the lungs every 6 (six) hours as needed. As needed, Disp: 3 Inhaler, Rfl: 4 .  vitamin E 100 UNIT capsule, Take 100 Units by mouth daily., Disp: , Rfl:   Review of Systems  HENT: Positive for congestion, rhinorrhea, sinus pain and sore throat.   Respiratory: Positive for cough.   Neurological: Positive for headaches.    Social History  Substance Use Topics  . Smoking status: Never Smoker  . Smokeless tobacco: Never Used  . Alcohol use No   Objective:   BP 132/68 (BP Location: Left Arm, Patient Position: Sitting, Cuff Size: Normal)   Pulse 68   Temp 98.5 F (36.9 C)  Resp 16   Wt 167 lb (75.8 kg)   SpO2 95%   BMI 27.79 kg/m   Physical Exam  General Appearance:    Alert, cooperative, no distress  HENT:   bilateral TM normal without fluid or infection, neck has bilateral anterior cervical nodes enlarged and maxillary sinus tender  Eyes:    PERRL, conjunctiva/corneas clear, EOM's intact       Lungs:     Clear to auscultation bilaterally, respirations unlabored  Heart:    Regular rate and rhythm  Neurologic:   Awake, alert, oriented x 3. No apparent focal neurological           defect.       Results for orders placed or performed in visit on 07/05/16  POCT Influenza A/B  Result Value Ref Range   Influenza A, POC Negative  Negative   Influenza B, POC Negative Negative       Assessment & Plan:     1. Cough  - POCT Influenza A/B  2. Acute maxillary sinusitis, recurrence not specified  - levofloxacin (LEVAQUIN) 500 MG tablet; Take 1 tablet (500 mg total) by mouth daily.  Dispense: 10 tablet; Refill: 0  3. Benign hypertension She requests refill for her blood pressure medications.  - amLODipine (NORVASC) 5 MG tablet; Take 1 tablet (5 mg total) by mouth daily.  Dispense: 90 tablet; Refill: 1 - metoprolol succinate (TOPROL-XL) 50 MG 24 hr tablet; Take 1 tablet (50 mg total) by mouth daily. Take with or immediately following a meal.  Dispense: 90 tablet; Refill: 1 - benazepril-hydrochlorthiazide (LOTENSIN HCT) 20-12.5 MG tablet; Take 1 tablet by mouth 2 (two) times daily.  Dispense: 180 tablet; Refill: 1     The entirety of the information documented in the History of Present Illness, Review of Systems and Physical Exam were personally obtained by me. Portions of this information were initially documented by Wilburt Finlay, CMA and reviewed by me for thoroughness and accuracy.    Lelon Huh, MD  [facility]

## 2016-08-01 ENCOUNTER — Telehealth: Payer: Self-pay

## 2016-08-01 ENCOUNTER — Ambulatory Visit (INDEPENDENT_AMBULATORY_CARE_PROVIDER_SITE_OTHER): Payer: PPO | Admitting: Physician Assistant

## 2016-08-01 ENCOUNTER — Encounter: Payer: Self-pay | Admitting: Physician Assistant

## 2016-08-01 VITALS — BP 140/70 | HR 60 | Temp 97.8°F | Resp 16 | Wt 165.8 lb

## 2016-08-01 DIAGNOSIS — E7800 Pure hypercholesterolemia, unspecified: Secondary | ICD-10-CM

## 2016-08-01 DIAGNOSIS — J209 Acute bronchitis, unspecified: Secondary | ICD-10-CM

## 2016-08-01 DIAGNOSIS — I10 Essential (primary) hypertension: Secondary | ICD-10-CM

## 2016-08-01 DIAGNOSIS — I5022 Chronic systolic (congestive) heart failure: Secondary | ICD-10-CM

## 2016-08-01 DIAGNOSIS — J301 Allergic rhinitis due to pollen: Secondary | ICD-10-CM | POA: Diagnosis not present

## 2016-08-01 MED ORDER — BENAZEPRIL-HYDROCHLOROTHIAZIDE 20-12.5 MG PO TABS: 1.0000 | ORAL_TABLET | Freq: Two times a day (BID) | ORAL | 0 refills | Status: DC

## 2016-08-01 MED ORDER — FUROSEMIDE 40 MG PO TABS: 40.0000 mg | ORAL_TABLET | Freq: Every day | ORAL | 0 refills | Status: DC | PRN

## 2016-08-01 MED ORDER — BUDESONIDE-FORMOTEROL FUMARATE 160-4.5 MCG/ACT IN AERO: 2.0000 | INHALATION_SPRAY | Freq: Two times a day (BID) | RESPIRATORY_TRACT | 3 refills | Status: DC

## 2016-08-01 MED ORDER — BENAZEPRIL-HYDROCHLOROTHIAZIDE 20-12.5 MG PO TABS: 1.0000 | ORAL_TABLET | Freq: Two times a day (BID) | ORAL | 3 refills | Status: DC

## 2016-08-01 MED ORDER — METOPROLOL SUCCINATE ER 50 MG PO TB24: 50.0000 mg | ORAL_TABLET | Freq: Every day | ORAL | 3 refills | Status: DC

## 2016-08-01 MED ORDER — AMLODIPINE BESYLATE 5 MG PO TABS: 5.0000 mg | ORAL_TABLET | Freq: Every day | ORAL | 0 refills | Status: DC

## 2016-08-01 MED ORDER — AEROCHAMBER MINI CHAMBER DEVI: 0 refills | Status: AC

## 2016-08-01 MED ORDER — POTASSIUM CHLORIDE CRYS ER 20 MEQ PO TBCR: 20.0000 meq | EXTENDED_RELEASE_TABLET | ORAL | 1 refills | Status: DC | PRN

## 2016-08-01 MED ORDER — AMLODIPINE BESYLATE 5 MG PO TABS: 5.0000 mg | ORAL_TABLET | Freq: Every day | ORAL | 3 refills | Status: DC

## 2016-08-01 MED ORDER — FLUTICASONE PROPIONATE 50 MCG/ACT NA SUSP: 2.0000 | Freq: Every day | NASAL | 6 refills | Status: DC

## 2016-08-01 MED ORDER — SIMVASTATIN 20 MG PO TABS: 20.0000 mg | ORAL_TABLET | Freq: Every day | ORAL | 0 refills | Status: DC

## 2016-08-01 MED ORDER — ALBUTEROL SULFATE (2.5 MG/3ML) 0.083% IN NEBU: 2.5000 mg | INHALATION_SOLUTION | Freq: Four times a day (QID) | RESPIRATORY_TRACT | 1 refills | Status: DC | PRN

## 2016-08-01 MED ORDER — METOPROLOL SUCCINATE ER 50 MG PO TB24: 50.0000 mg | ORAL_TABLET | Freq: Every day | ORAL | 0 refills | Status: DC

## 2016-08-01 MED ORDER — VENTOLIN HFA 108 (90 BASE) MCG/ACT IN AERS: 2.0000 | INHALATION_SPRAY | Freq: Four times a day (QID) | RESPIRATORY_TRACT | 4 refills | Status: DC | PRN

## 2016-08-01 MED ORDER — SIMVASTATIN 20 MG PO TABS: 20.0000 mg | ORAL_TABLET | Freq: Every day | ORAL | 3 refills | Status: DC

## 2016-08-01 NOTE — Progress Notes (Signed)
[name] Female    DOB: 05-11-1931   83 y.o.   MRN: [id number] Visit Date: 08/01/2016  Today's Provider: Mar Daring, PA-C   Chief Complaint  Patient presents with  . Medication Refill   Subjective:    HPI Patient is here today to discuss medications. She needs refills.   Hypertension, follow-up:  BP Readings from Last 3 Encounters:  08/01/16 140/70  07/05/16 132/68  10/15/15 128/76    She was last seen for hypertension 9 months ago.  BP at that visit was 128/76. Management since that visit includes none. She reports excellent compliance with treatment. She is not having side effects.  She is adherent to low salt diet.   Patient denies chest pain, exertional chest pressure/discomfort, irregular heart beat and palpitations.   Cardiovascular risk factors include advanced age (older than 72 for men, 7 for women), dyslipidemia and hypertension.     Wt Readings from Last 3 Encounters:  08/01/16 165 lb 12.8 oz (75.2 kg)  07/05/16 167 lb (75.8 kg)  10/15/15 166 lb (75.3 kg)   ------------------------------------------------------------------------    No Known Allergies   Current Outpatient Prescriptions:  .  albuterol (PROVENTIL) (2.5 MG/3ML) 0.083% nebulizer solution, Take 3 mLs (2.5 mg total) by nebulization every 6 (six) hours as needed for wheezing or shortness of breath., Disp: 150 mL, Rfl: 1 .  amLODipine (NORVASC) 5 MG tablet, Take 1 tablet (5 mg total) by mouth daily., Disp: 90 tablet, Rfl: 1 .  aspirin 81 MG tablet, Take 81 mg by mouth daily., Disp: , Rfl:  .  benazepril-hydrochlorthiazide (LOTENSIN HCT) 20-12.5 MG tablet, Take 1 tablet by mouth 2 (two) times daily., Disp: 180 tablet, Rfl: 1 .  budesonide-formoterol (SYMBICORT) 160-4.5 MCG/ACT inhaler, Inhale 2 puffs into the lungs 2 (two) times daily., Disp: 3 Inhaler, Rfl: 1 .  CALCIUM CARBONATE-VIT D-MIN PO, Take 1 tablet by mouth daily. , Disp: , Rfl:  .  fluticasone  (FLONASE) 50 MCG/ACT nasal spray, Place 2 sprays into both nostrils daily., Disp: 16 g, Rfl: 6 .  furosemide (LASIX) 40 MG tablet, Take 40 mg by mouth. As needed, Disp: , Rfl:  .  L-Lysine 500 MG CAPS, Take by mouth. Reported on 10/15/2015, Disp: , Rfl:  .  Menthol-Methyl Salicylate (MUSCLE RUB EX), , Disp: , Rfl:  .  metoprolol succinate (TOPROL-XL) 50 MG 24 hr tablet, Take 1 tablet (50 mg total) by mouth daily. Take with or immediately following a meal., Disp: 90 tablet, Rfl: 1 .  Misc Natural Products (OSTEO BI-FLEX ADV JOINT SHIELD) TABS, Take by mouth., Disp: , Rfl:  .  Multiple Vitamin (MULTIVITAMIN) capsule, Take 1 capsule by mouth daily., Disp: , Rfl:  .  Omega-3 Fatty Acids (FISH OIL) 1000 MG CAPS, Take 1 capsule by mouth daily., Disp: , Rfl:  .  potassium chloride SA (K-DUR,KLOR-CON) 20 MEQ tablet, Take 20 mEq by mouth as needed. , Disp: , Rfl:  .  simvastatin (ZOCOR) 20 MG tablet, Take 1 tablet (20 mg total) by mouth at bedtime., Disp: 90 tablet, Rfl: 3 .  Spacer/Aero-Holding Chambers (AEROCHAMBER MINI CHAMBER) DEVI, Use spacer with inhalers., Disp: 1 Device, Rfl: 0 .  VENTOLIN HFA 108 (90 Base) MCG/ACT inhaler, Inhale 2 puffs into the lungs every 6 (six) hours as needed. As needed, Disp: 3 Inhaler, Rfl: 4 .  vitamin E 100 UNIT capsule, Take 100 Units by mouth daily., Disp: , Rfl:   Review of Systems  Constitutional:  Negative for fatigue.  Respiratory: Negative for cough, chest tightness, shortness of breath and wheezing.   Cardiovascular: Negative for chest pain and palpitations.  Gastrointestinal: Negative for abdominal pain.  Endocrine: Negative.   Neurological: Positive for light-headedness. Negative for dizziness, weakness, numbness and headaches.    Social History  Substance Use Topics  . Smoking status: Never Smoker  . Smokeless tobacco: Never Used  . Alcohol use No   Objective:   BP 140/70 (BP Location: Right Arm, Patient Position: Sitting, Cuff Size: Normal)   Pulse  60   Temp 97.8 F (36.6 C) (Oral)   Resp 16   Wt 165 lb 12.8 oz (75.2 kg)   BMI 27.59 kg/m   Physical Exam  Constitutional: She appears well-developed and well-nourished. No distress.  Neck: Normal range of motion. Neck supple. No JVD present. No tracheal deviation present. No thyromegaly present.  Cardiovascular: Normal rate, regular rhythm and normal heart sounds.  Exam reveals no gallop and no friction rub.   No murmur heard. Pulmonary/Chest: Effort normal and breath sounds normal. No respiratory distress. She has no wheezes. She has no rales.  Musculoskeletal: She exhibits no edema.  Lymphadenopathy:    She has no cervical adenopathy.  Skin: She is not diaphoretic.  Psychiatric: She has a normal mood and affect. Her behavior is normal. Judgment and thought content normal.  Vitals reviewed.     Assessment & Plan:     1. Bronchitis with bronchospasm Stable. Diagnosis pulled for medication refill. Continue current medical treatment plan. - VENTOLIN HFA 108 (90 Base) MCG/ACT inhaler; Inhale 2 puffs into the lungs every 6 (six) hours as needed. As needed  Dispense: 3 Inhaler; Refill: 4 - Spacer/Aero-Holding Chambers (AEROCHAMBER MINI CHAMBER) DEVI; Use spacer with inhalers.  Dispense: 1 Device; Refill: 0 - albuterol (PROVENTIL) (2.5 MG/3ML) 0.083% nebulizer solution; Take 3 mLs (2.5 mg total) by nebulization every 6 (six) hours as needed for wheezing or shortness of breath.  Dispense: 150 mL; Refill: 1 - budesonide-formoterol (SYMBICORT) 160-4.5 MCG/ACT inhaler; Inhale 2 puffs into the lungs 2 (two) times daily.  Dispense: 3 Inhaler; Refill: 3  2. Hypercholesteremia Stable. Diagnosis pulled for medication refill. Continue current medical treatment plan. - simvastatin (ZOCOR) 20 MG tablet; Take 1 tablet (20 mg total) by mouth at bedtime.  Dispense: 90 tablet; Refill: 3  3. Benign hypertension Stable. Diagnosis pulled for medication refill. Continue current medical treatment plan. -  metoprolol succinate (TOPROL-XL) 50 MG 24 hr tablet; Take 1 tablet (50 mg total) by mouth daily. Take with or immediately following a meal.  Dispense: 90 tablet; Refill: 3 - benazepril-hydrochlorthiazide (LOTENSIN HCT) 20-12.5 MG tablet; Take 1 tablet by mouth 2 (two) times daily.  Dispense: 180 tablet; Refill: 3 - amLODipine (NORVASC) 5 MG tablet; Take 1 tablet (5 mg total) by mouth daily.  Dispense: 90 tablet; Refill: 3  4. Chronic systolic heart failure (HCC) Stable. Diagnosis pulled for medication refill. Continue current medical treatment plan. Only uses prn. - furosemide (LASIX) 40 MG tablet; Take 1 tablet (40 mg total) by mouth daily as needed. As needed  Dispense: 30 tablet; Refill: 0 - potassium chloride SA (K-DUR,KLOR-CON) 20 MEQ tablet; Take 1 tablet (20 mEq total) by mouth as needed.  Dispense: 30 tablet; Refill: 1  5. Acute seasonal allergic rhinitis due to pollen Stable. Diagnosis pulled for medication refill. Continue current medical treatment plan. - fluticasone (FLONASE) 50 MCG/ACT nasal spray; Place 2 sprays into both nostrils daily.  Dispense: 16 g; Refill: 6  The entirety of the information documented in the History of Present Illness, Review of Systems and Physical Exam were personally obtained by me. Portions of this information were initially documented by Lyndel Pleasure, CMA and reviewed by me for thoroughness and accuracy.      Mar Daring, PA-C  [facility]

## 2016-08-01 NOTE — Patient Instructions (Signed)
DASH Eating Plan DASH stands for "Dietary Approaches to Stop Hypertension." The DASH eating plan is a healthy eating plan that has been shown to reduce high blood pressure (hypertension). Additional health benefits may include reducing the risk of type 2 diabetes mellitus, heart disease, and stroke. The DASH eating plan may also help with weight loss. What do I need to know about the DASH eating plan? For the DASH eating plan, you will follow these general guidelines:  Choose foods with less than 150 milligrams of sodium per serving (as listed on the food label).  Use salt-free seasonings or herbs instead of table salt or sea salt.  Check with your health care provider or pharmacist before using salt substitutes.  Eat lower-sodium products. These are often labeled as "low-sodium" or "no salt added."  Eat fresh foods. Avoid eating a lot of canned foods.  Eat more vegetables, fruits, and low-fat dairy products.  Choose whole grains. Look for the word "whole" as the first word in the ingredient list.  Choose fish and skinless chicken or turkey more often than red meat. Limit fish, poultry, and meat to 6 oz (170 g) each day.  Limit sweets, desserts, sugars, and sugary drinks.  Choose heart-healthy fats.  Eat more home-cooked food and less restaurant, buffet, and fast food.  Limit fried foods.  Do not fry foods. Cook foods using methods such as baking, boiling, grilling, and broiling instead.  When eating at a restaurant, ask that your food be prepared with less salt, or no salt if possible. What foods can I eat? Seek help from a dietitian for individual calorie needs. Grains  Whole grain or whole wheat bread. Brown rice. Whole grain or whole wheat pasta. Quinoa, bulgur, and whole grain cereals. Low-sodium cereals. Corn or whole wheat flour tortillas. Whole grain cornbread. Whole grain crackers. Low-sodium crackers. Vegetables  Fresh or frozen vegetables (raw, steamed, roasted, or  grilled). Low-sodium or reduced-sodium tomato and vegetable juices. Low-sodium or reduced-sodium tomato sauce and paste. Low-sodium or reduced-sodium canned vegetables. Fruits  All fresh, canned (in natural juice), or frozen fruits. Meat and Other Protein Products  Ground beef (85% or leaner), grass-fed beef, or beef trimmed of fat. Skinless chicken or turkey. Ground chicken or turkey. Pork trimmed of fat. All fish and seafood. Eggs. Dried beans, peas, or lentils. Unsalted nuts and seeds. Unsalted canned beans. Dairy  Low-fat dairy products, such as skim or 1% milk, 2% or reduced-fat cheeses, low-fat ricotta or cottage cheese, or plain low-fat yogurt. Low-sodium or reduced-sodium cheeses. Fats and Oils  Tub margarines without trans fats. Light or reduced-fat mayonnaise and salad dressings (reduced sodium). Avocado. Safflower, olive, or canola oils. Natural peanut or almond butter. Other  Unsalted popcorn and pretzels. The items listed above may not be a complete list of recommended foods or beverages. Contact your dietitian for more options.  What foods are not recommended? Grains  White bread. White pasta. White rice. Refined cornbread. Bagels and croissants. Crackers that contain trans fat. Vegetables  Creamed or fried vegetables. Vegetables in a cheese sauce. Regular canned vegetables. Regular canned tomato sauce and paste. Regular tomato and vegetable juices. Fruits  Canned fruit in light or heavy syrup. Fruit juice. Meat and Other Protein Products  Fatty cuts of meat. Ribs, chicken wings, bacon, sausage, bologna, salami, chitterlings, fatback, hot dogs, bratwurst, and packaged luncheon meats. Salted nuts and seeds. Canned beans with salt. Dairy  Whole or 2% milk, cream, half-and-half, and cream cheese. Whole-fat or sweetened yogurt. Full-fat cheeses   or blue cheese. Nondairy creamers and whipped toppings. Processed cheese, cheese spreads, or cheese curds. Condiments  Onion and garlic  salt, seasoned salt, table salt, and sea salt. Canned and packaged gravies. Worcestershire sauce. Tartar sauce. Barbecue sauce. Teriyaki sauce. Soy sauce, including reduced sodium. Steak sauce. Fish sauce. Oyster sauce. Cocktail sauce. Horseradish. Ketchup and mustard. Meat flavorings and tenderizers. Bouillon cubes. Hot sauce. Tabasco sauce. Marinades. Taco seasonings. Relishes. Fats and Oils  Butter, stick margarine, lard, shortening, ghee, and bacon fat. Coconut, palm kernel, or palm oils. Regular salad dressings. Other  Pickles and olives. Salted popcorn and pretzels. The items listed above may not be a complete list of foods and beverages to avoid. Contact your dietitian for more information.  Where can I find more information? National Heart, Lung, and Blood Institute: www.nhlbi.nih.gov/health/health-topics/topics/dash/ This information is not intended to replace advice given to you by your health care provider. Make sure you discuss any questions you have with your health care provider. Document Released: 06/16/2011 Document Revised: 12/03/2015 Document Reviewed: 05/01/2013 Elsevier Interactive Patient Education  2017 Elsevier Inc.  

## 2016-08-01 NOTE — Telephone Encounter (Signed)
Prescription for aerochamber mini chamber device was called in to [facility].  Thanks,  -Lindee Leason

## 2016-10-20 ENCOUNTER — Ambulatory Visit (INDEPENDENT_AMBULATORY_CARE_PROVIDER_SITE_OTHER): Payer: PPO

## 2016-10-20 ENCOUNTER — Ambulatory Visit: Payer: PPO | Admitting: Physician Assistant

## 2016-10-20 VITALS — BP 142/66 | HR 60 | Temp 97.5°F | Ht 60.0 in | Wt 163.4 lb

## 2016-10-20 DIAGNOSIS — E7800 Pure hypercholesterolemia, unspecified: Secondary | ICD-10-CM

## 2016-10-20 DIAGNOSIS — R739 Hyperglycemia, unspecified: Secondary | ICD-10-CM

## 2016-10-20 DIAGNOSIS — I129 Hypertensive chronic kidney disease with stage 1 through stage 4 chronic kidney disease, or unspecified chronic kidney disease: Secondary | ICD-10-CM | POA: Diagnosis not present

## 2016-10-20 DIAGNOSIS — N183 Chronic kidney disease, stage 3 unspecified: Secondary | ICD-10-CM

## 2016-10-20 DIAGNOSIS — Z0000 Encounter for general adult medical examination without abnormal findings: Secondary | ICD-10-CM | POA: Diagnosis not present

## 2016-10-20 DIAGNOSIS — Z23 Encounter for immunization: Secondary | ICD-10-CM | POA: Diagnosis not present

## 2016-10-20 DIAGNOSIS — I8390 Asymptomatic varicose veins of unspecified lower extremity: Secondary | ICD-10-CM

## 2016-10-20 NOTE — Patient Instructions (Signed)
DASH Eating Plan DASH stands for "Dietary Approaches to Stop Hypertension." The DASH eating plan is a healthy eating plan that has been shown to reduce high blood pressure (hypertension). It may also reduce your risk for type 2 diabetes, heart disease, and stroke. The DASH eating plan may also help with weight loss. What are tips for following this plan? General guidelines  Avoid eating more than 2,300 mg (milligrams) of salt (sodium) a day. If you have hypertension, you may need to reduce your sodium intake to 1,500 mg a day.  Limit alcohol intake to no more than 1 drink a day for nonpregnant women and 2 drinks a day for men. One drink equals 12 oz of beer, 5 oz of wine, or 1 oz of hard liquor.  Work with your health care provider to maintain a healthy body weight or to lose weight. Ask what an ideal weight is for you.  Get at least 30 minutes of exercise that causes your heart to beat faster (aerobic exercise) most days of the week. Activities may include walking, swimming, or biking.  Work with your health care provider or diet and nutrition specialist (dietitian) to adjust your eating plan to your individual calorie needs. Reading food labels  Check food labels for the amount of sodium per serving. Choose foods with less than 5 percent of the Daily Value of sodium. Generally, foods with less than 300 mg of sodium per serving fit into this eating plan.  To find whole grains, look for the word "whole" as the first word in the ingredient list. Shopping  Buy products labeled as "low-sodium" or "no salt added."  Buy fresh foods. Avoid canned foods and premade or frozen meals. Cooking  Avoid adding salt when cooking. Use salt-free seasonings or herbs instead of table salt or sea salt. Check with your health care provider or pharmacist before using salt substitutes.  Do not fry foods. Cook foods using healthy methods such as baking, boiling, grilling, and broiling instead.  Cook with  heart-healthy oils, such as olive, canola, soybean, or sunflower oil. Meal planning   Eat a balanced diet that includes: ? 5 or more servings of fruits and vegetables each day. At each meal, try to fill half of your plate with fruits and vegetables. ? Up to 6-8 servings of whole grains each day. ? Less than 6 oz of lean meat, poultry, or fish each day. A 3-oz serving of meat is about the same size as a deck of cards. One egg equals 1 oz. ? 2 servings of low-fat dairy each day. ? A serving of nuts, seeds, or beans 5 times each week. ? Heart-healthy fats. Healthy fats called Omega-3 fatty acids are found in foods such as flaxseeds and coldwater fish, like sardines, salmon, and mackerel.  Limit how much you eat of the following: ? Canned or prepackaged foods. ? Food that is high in trans fat, such as fried foods. ? Food that is high in saturated fat, such as fatty meat. ? Sweets, desserts, sugary drinks, and other foods with added sugar. ? Full-fat dairy products.  Do not salt foods before eating.  Try to eat at least 2 vegetarian meals each week.  Eat more home-cooked food and less restaurant, buffet, and fast food.  When eating at a restaurant, ask that your food be prepared with less salt or no salt, if possible. What foods are recommended? The items listed may not be a complete list. Talk with your dietitian about what   dietary choices are best for you. Grains Whole-grain or whole-wheat bread. Whole-grain or whole-wheat pasta. Brown rice. Oatmeal. Quinoa. Bulgur. Whole-grain and low-sodium cereals. Pita bread. Low-fat, low-sodium crackers. Whole-wheat flour tortillas. Vegetables Fresh or frozen vegetables (raw, steamed, roasted, or grilled). Low-sodium or reduced-sodium tomato and vegetable juice. Low-sodium or reduced-sodium tomato sauce and tomato paste. Low-sodium or reduced-sodium canned vegetables. Fruits All fresh, dried, or frozen fruit. Canned fruit in natural juice (without  added sugar). Meat and other protein foods Skinless chicken or turkey. Ground chicken or turkey. Pork with fat trimmed off. Fish and seafood. Egg whites. Dried beans, peas, or lentils. Unsalted nuts, nut butters, and seeds. Unsalted canned beans. Lean cuts of beef with fat trimmed off. Low-sodium, lean deli meat. Dairy Low-fat (1%) or fat-free (skim) milk. Fat-free, low-fat, or reduced-fat cheeses. Nonfat, low-sodium ricotta or cottage cheese. Low-fat or nonfat yogurt. Low-fat, low-sodium cheese. Fats and oils Soft margarine without trans fats. Vegetable oil. Low-fat, reduced-fat, or light mayonnaise and salad dressings (reduced-sodium). Canola, safflower, olive, soybean, and sunflower oils. Avocado. Seasoning and other foods Herbs. Spices. Seasoning mixes without salt. Unsalted popcorn and pretzels. Fat-free sweets. What foods are not recommended? The items listed may not be a complete list. Talk with your dietitian about what dietary choices are best for you. Grains Baked goods made with fat, such as croissants, muffins, or some breads. Dry pasta or rice meal packs. Vegetables Creamed or fried vegetables. Vegetables in a cheese sauce. Regular canned vegetables (not low-sodium or reduced-sodium). Regular canned tomato sauce and paste (not low-sodium or reduced-sodium). Regular tomato and vegetable juice (not low-sodium or reduced-sodium). Pickles. Olives. Fruits Canned fruit in a light or heavy syrup. Fried fruit. Fruit in cream or butter sauce. Meat and other protein foods Fatty cuts of meat. Ribs. Fried meat. Bacon. Sausage. Bologna and other processed lunch meats. Salami. Fatback. Hotdogs. Bratwurst. Salted nuts and seeds. Canned beans with added salt. Canned or smoked fish. Whole eggs or egg yolks. Chicken or turkey with skin. Dairy Whole or 2% milk, cream, and half-and-half. Whole or full-fat cream cheese. Whole-fat or sweetened yogurt. Full-fat cheese. Nondairy creamers. Whipped toppings.  Processed cheese and cheese spreads. Fats and oils Butter. Stick margarine. Lard. Shortening. Ghee. Bacon fat. Tropical oils, such as coconut, palm kernel, or palm oil. Seasoning and other foods Salted popcorn and pretzels. Onion salt, garlic salt, seasoned salt, table salt, and sea salt. Worcestershire sauce. Tartar sauce. Barbecue sauce. Teriyaki sauce. Soy sauce, including reduced-sodium. Steak sauce. Canned and packaged gravies. Fish sauce. Oyster sauce. Cocktail sauce. Horseradish that you find on the shelf. Ketchup. Mustard. Meat flavorings and tenderizers. Bouillon cubes. Hot sauce and Tabasco sauce. Premade or packaged marinades. Premade or packaged taco seasonings. Relishes. Regular salad dressings. Where to find more information:  National Heart, Lung, and Blood Institute: www.nhlbi.nih.gov  American Heart Association: www.heart.org Summary  The DASH eating plan is a healthy eating plan that has been shown to reduce high blood pressure (hypertension). It may also reduce your risk for type 2 diabetes, heart disease, and stroke.  With the DASH eating plan, you should limit salt (sodium) intake to 2,300 mg a day. If you have hypertension, you may need to reduce your sodium intake to 1,500 mg a day.  When on the DASH eating plan, aim to eat more fresh fruits and vegetables, whole grains, lean proteins, low-fat dairy, and heart-healthy fats.  Work with your health care provider or diet and nutrition specialist (dietitian) to adjust your eating plan to your individual   calorie needs. This information is not intended to replace advice given to you by your health care provider. Make sure you discuss any questions you have with your health care provider. Document Released: 06/16/2011 Document Revised: 06/20/2016 Document Reviewed: 06/20/2016 Elsevier Interactive Patient Education  2017 Elsevier Inc.  

## 2016-10-20 NOTE — Progress Notes (Signed)
[name], Female    DOB: June 07, 1931, 83 y.o.   MRN: [id number] Visit Date: 10/20/2016  Today's Provider: Mar Daring, PA-C   Chief Complaint  Patient presents with  . Medicare Wellness   Subjective:    Annual wellness visit Amanda Strickland is a 83 y.o. female. She feels well. She reports exercising active with daily activities. She reports she is sleeping well.  10/25/15 AWE 02/02/16 Mammogram-BI-RADS 1 06/30/14 BMD-osteoporosis -----------------------------------------------------------   Review of Systems  Constitutional: Negative.   HENT: Positive for postnasal drip.   Eyes: Positive for discharge.  Respiratory: Negative.   Cardiovascular: Positive for leg swelling.  Gastrointestinal: Negative.   Endocrine: Negative.   Genitourinary: Negative.   Musculoskeletal: Positive for arthralgias.  Skin: Negative.   Allergic/Immunologic: Negative.   Neurological: Negative.   Hematological: Negative.   Psychiatric/Behavioral: Negative.     Social History   Social History  . Marital status: Widowed    Spouse name: N/A  . Number of children: N/A  . Years of education: N/A   Occupational History  . Not on file.   Social History Main Topics  . Smoking status: Never Smoker  . Smokeless tobacco: Never Used  . Alcohol use No  . Drug use: No  . Sexual activity: Not on file   Other Topics Concern  . Not on file   Social History Narrative  . No narrative on file    Past Medical History:  Diagnosis Date  . Arthritis   . CHF (congestive heart failure) ([place])   . Depression   . Heart attack 1994  . Hypertension      Patient Active Problem List   Diagnosis Date Noted  . COPD exacerbation ([place]) 10/15/2015  . Chronic kidney disease (CKD), stage III (moderate) 05/25/2015  . Congestive heart failure due to high blood pressure ([place]) 05/25/2015  . Fasciculation 05/25/2015  . Blood glucose elevated 05/25/2015  . OP (osteoporosis)  05/25/2015  . Leg varices 05/25/2015  . Chronic diastolic heart failure ([place]) 11/19/2014  . Benign hypertension with CKD (chronic kidney disease) stage III 11/19/2014  . Dizziness 11/19/2014  . Arteriosclerosis of bypass graft of coronary artery 01/29/2014  . History of colon polyps 02/18/2010  . Benign hypertension 03/30/2009  . Arteriosclerosis of coronary artery 03/30/2009  . Hypercholesteremia 03/30/2009    Past Surgical History:  Procedure Laterality Date  . CORONARY ARTERY BYPASS GRAFT  2001    Her family history includes Breast cancer in her maternal aunt; Heart attack in her father.      Current Outpatient Prescriptions:  .  albuterol (PROVENTIL) (2.5 MG/3ML) 0.083% nebulizer solution, Take 3 mLs (2.5 mg total) by nebulization every 6 (six) hours as needed for wheezing or shortness of breath., Disp: 150 mL, Rfl: 1 .  amLODipine (NORVASC) 5 MG tablet, Take 1 tablet (5 mg total) by mouth daily., Disp: 90 tablet, Rfl: 3 .  aspirin 81 MG tablet, Take 81 mg by mouth daily., Disp: , Rfl:  .  benazepril-hydrochlorthiazide (LOTENSIN HCT) 20-12.5 MG tablet, Take 1 tablet by mouth 2 (two) times daily., Disp: 180 tablet, Rfl: 3 .  budesonide-formoterol (SYMBICORT) 160-4.5 MCG/ACT inhaler, Inhale 2 puffs into the lungs 2 (two) times daily. (Patient taking differently: Inhale 2 puffs into the lungs 2 (two) times daily. ), Disp: 3 Inhaler, Rfl: 3 .  CALCIUM CARBONATE-VIT D-MIN PO, Take 1 tablet by mouth daily. , Disp: , Rfl:  .  fluticasone (FLONASE) 50 MCG/ACT  nasal spray, Place 2 sprays into both nostrils daily. (Patient taking differently: Place 2 sprays into both nostrils daily. ), Disp: 16 g, Rfl: 6 .  furosemide (LASIX) 40 MG tablet, Take 1 tablet (40 mg total) by mouth daily as needed. As needed, Disp: 30 tablet, Rfl: 0 .  L-Lysine 500 MG CAPS, Take by mouth. Reported on 10/15/2015, Disp: , Rfl:  .  Menthol-Methyl Salicylate (MUSCLE RUB EX), , Disp: , Rfl:  .  metoprolol succinate  (TOPROL-XL) 50 MG 24 hr tablet, Take 1 tablet (50 mg total) by mouth daily. Take with or immediately following a meal., Disp: 90 tablet, Rfl: 3 .  Misc Natural Products (OSTEO BI-FLEX ADV JOINT SHIELD) TABS, Take by mouth., Disp: , Rfl:  .  Multiple Vitamin (MULTIVITAMIN) capsule, Take 1 capsule by mouth daily., Disp: , Rfl:  .  Omega-3 Fatty Acids (FISH OIL) 1000 MG CAPS, Take 1 capsule by mouth daily., Disp: , Rfl:  .  potassium chloride SA (K-DUR,KLOR-CON) 20 MEQ tablet, Take 1 tablet (20 mEq total) by mouth as needed., Disp: 30 tablet, Rfl: 1 .  simvastatin (ZOCOR) 20 MG tablet, Take 1 tablet (20 mg total) by mouth at bedtime., Disp: 90 tablet, Rfl: 3 .  Spacer/Aero-Holding Chambers (AEROCHAMBER MINI CHAMBER) DEVI, Use spacer with inhalers., Disp: 1 Device, Rfl: 0 .  VENTOLIN HFA 108 (90 Base) MCG/ACT inhaler, Inhale 2 puffs into the lungs every 6 (six) hours as needed. As needed, Disp: 3 Inhaler, Rfl: 4 .  vitamin E 100 UNIT capsule, Take 100 Units by mouth daily., Disp: , Rfl:   Patient Care Team: Mar Daring, PA-C as PCP - General (Family Medicine) Teodoro Spray, MD as Consulting Physician (Cardiology) Eulogio Bear, MD as Consulting Physician (Ophthalmology) Novella Olive, MD as Referring Physician (Dermatology)     Objective:   Vitals: BP (!) 142/66 (BP Location: Right Arm)   Pulse 60   Temp 97.5 F (36.4 C) (Oral)   Ht 5' (1.524 m)   Wt 163 lb 6.4 oz (74.1 kg)   BMI 31.91 kg/m   Body mass index is 31.91 kg/m. Physical Exam  Constitutional: She is oriented to person, place, and time. She appears well-developed and well-nourished. No distress.  HENT:  Head: Normocephalic and atraumatic.  Right Ear: Tympanic membrane, external ear and ear canal normal.  Left Ear: Tympanic membrane, external ear and ear canal normal.  Nose: Nose normal.  Mouth/Throat: Uvula is midline, oropharynx is clear and moist and mucous membranes are normal. No oropharyngeal  exudate.  Eyes: Conjunctivae and EOM are normal. Pupils are equal, round, and reactive to light. Right eye exhibits no discharge. Left eye exhibits no discharge. No scleral icterus.  Neck: Normal range of motion. Neck supple. No JVD present. Carotid bruit is not present. No tracheal deviation present. No thyromegaly present.  Cardiovascular: Normal rate, regular rhythm, normal heart sounds and intact distal pulses.  Exam reveals no gallop and no friction rub.   No murmur heard. Pulmonary/Chest: Effort normal and breath sounds normal. No respiratory distress. She has no wheezes. She has no rales. She exhibits no tenderness.  Abdominal: Soft. Bowel sounds are normal. She exhibits no distension and no mass. There is no tenderness. There is no rebound and no guarding.  Musculoskeletal: Normal range of motion. She exhibits edema (trace). She exhibits no tenderness.  Lymphadenopathy:    She has no cervical adenopathy.  Neurological: She is alert and oriented to person, place, and time.  Skin: Skin  is warm and dry. No rash noted. She is not diaphoretic.  Psychiatric: She has a normal mood and affect. Her behavior is normal. Judgment and thought content normal.  Vitals reviewed.   Activities of Daily Living In your present state of health, do you have any difficulty performing the following activities: 10/20/2016  Hearing? Y  Vision? N  Difficulty concentrating or making decisions? Y  Walking or climbing stairs? N  Dressing or bathing? N  Doing errands, shopping? N  Preparing Food and eating ? N  Using the Toilet? N  In the past six months, have you accidently leaked urine? N  Do you have problems with loss of bowel control? N  Managing your Medications? N  Managing your Finances? N  Housekeeping or managing your Housekeeping? N  Some recent data might be hidden    Fall Risk Assessment Fall Risk  10/20/2016 10/15/2015 06/29/2015  Falls in the past year? No No No  Injury with Fall? - - No      Depression Screen PHQ 2/9 Scores 10/20/2016 10/15/2015 06/29/2015  PHQ - 2 Score 2 0 0  PHQ- 9 Score 3 - -    Cognitive Testing - 6-CIT  Correct? Score   What year is it? yes 0 0 or 4  What month is it? yes 0 0 or 3  Memorize:    Pia Mau,  44,  [address],  [place],      What time is it? (within 1 hour) yes 0 0 or 3  Count backwards from 20 yes 0 0, 2, or 4  Name the months of the year yes 0 0, 2, or 4  Repeat name & address above yes 0 0, 2, 4, 6, 8, or 10       TOTAL SCORE  0/28   Interpretation:  Normal  Normal (0-7) Abnormal (8-28)    Audit-C Alcohol Use Screening  Question Answer Points  How often do you have alcoholic drink? never 0  On days you do drink alcohol, how many drinks do you typically consume? 0 0  How oftey will you drink 6 or more in a total? never 0  Total Score:  0   A score of 3 or more in women, and 4 or more in men indicates increased risk for alcohol abuse, EXCEPT if all of the points are from question 1.     Assessment & Plan:     Annual Wellness Visit  Reviewed patient's Family Medical History Reviewed and updated list of patient's medical providers Assessment of cognitive impairment was done Assessed patient's functional ability Established a written schedule for health screening [place] Completed and Reviewed  Exercise Activities and Dietary recommendations Goals    . Exercise           Recommend increasing exercise. Pt to start walking 5 days a week for 20-30 minutes at a time.        Immunization History  Administered Date(s) Administered  . Influenza Split 03/30/2009, 04/06/2010  . Influenza, High Dose Seasonal PF 05/12/2014, 05/19/2016  . Influenza,inj,Quad PF,36+ Mos 05/12/2015  . Pneumococcal Conjugate-13 05/12/2014  . Pneumococcal Polysaccharide-23 10/20/2016  . Td 09/13/2012  . Tdap 09/13/2012    Health Maintenance  Topic Date Due  . PNA vac Low Risk Adult (2 of 2 - PPSV23)  05/13/2015  . INFLUENZA VACCINE  02/08/2017  . TETANUS/TDAP  09/14/2022  . DEXA SCAN  Completed     Discussed health benefits of physical  activity, and encouraged her to engage in regular exercise appropriate for her age and condition.   1. Hypercholesteremia Stable. Continue Simvastatin 20mg . Will check labs as below and f/u pending results. - CBC with Differential/Platelet - Comprehensive metabolic panel - Hemoglobin A1c - Lipid Panel With LDL/HDL Ratio  2. Blood glucose elevated Stable. Dirt controlled. Will check labs as below and f/u pending results. - CBC with Differential/Platelet - Comprehensive metabolic panel - Hemoglobin A1c - Lipid Panel With LDL/HDL Ratio - TSH  3. Benign hypertension with CKD (chronic kidney disease) stage III Stable. Continue metoprolol 50mg , amlodipine 5mg , and benazepril-hctz 20-12.5mg . Will check labs as below and f/u pending results. - CBC with Differential/Platelet - Comprehensive metabolic panel - Hemoglobin A1c - Lipid Panel With LDL/HDL Ratio  4. Leg varices Stable.  ------------------------------------------------------------------------------------------------------------    Mar Daring, PA-C  [facility]

## 2016-10-20 NOTE — Patient Instructions (Signed)
Amanda Strickland , Thank you for taking time to come for your Medicare Wellness Visit. I appreciate your ongoing commitment to your health goals. Please review the following plan we discussed and let me know if I can assist you in the future.   Screening recommendations/referrals: Colonoscopy: last done 01/19/10 Mammogram: last done 02/02/16 Bone Density: last done 06/30/14 Recommended yearly ophthalmology/optometry visit for glaucoma screening and checkup Recommended yearly dental visit for hygiene and checkup  Vaccinations: Influenza vaccine: done 05/19/16 Pneumococcal vaccine: completed series as of today, Pneumovax23 given Tdap vaccine: last done 09/13/12   Advanced directives: Requested copy  Next appointment: None   Preventive Care 30 Years and Older, Female Preventive care refers to lifestyle choices and visits with your health care provider that can promote health and wellness. What does preventive care include?  A yearly physical exam. This is also called an annual well check.  Dental exams once or twice a year.  Routine eye exams. Ask your health care provider how often you should have your eyes checked.  Personal lifestyle choices, including:  Daily care of your teeth and gums.  Regular physical activity.  Eating a healthy diet.  Avoiding tobacco and drug use.  Limiting alcohol use.  Practicing safe sex.  Taking low-dose aspirin every day.  Taking vitamin and mineral supplements as recommended by your health care provider. What happens during an annual well check? The services and screenings done by your health care provider during your annual well check will depend on your age, overall health, lifestyle risk factors, and family history of disease. Counseling  Your health care provider may ask you questions about your:  Alcohol use.  Tobacco use.  Drug use.  Emotional well-being.  Home and relationship well-being.  Sexual activity.  Eating  habits.  History of falls.  Memory and ability to understand (cognition).  Work and work Statistician.  Reproductive health. Screening  You may have the following tests or measurements:  Height, weight, and BMI.  Blood pressure.  Lipid and cholesterol levels. These may be checked every 5 years, or more frequently if you are over 45 years old.  Skin check.  Lung cancer screening. You may have this screening every year starting at age 76 if you have a 30-pack-year history of smoking and currently smoke or have quit within the past 15 years.  Fecal occult blood test (FOBT) of the stool. You may have this test every year starting at age 87.  Flexible sigmoidoscopy or colonoscopy. You may have a sigmoidoscopy every 5 years or a colonoscopy every 10 years starting at age 36.  Hepatitis C blood test.  Hepatitis B blood test.  Sexually transmitted disease (STD) testing.  Diabetes screening. This is done by checking your blood sugar (glucose) after you have not eaten for a while (fasting). You may have this done every 1-3 years.  Bone density scan. This is done to screen for osteoporosis. You may have this done starting at age 59.  Mammogram. This may be done every 1-2 years. Talk to your health care provider about how often you should have regular mammograms. Talk with your health care provider about your test results, treatment options, and if necessary, the need for more tests. Vaccines  Your health care provider may recommend certain vaccines, such as:  Influenza vaccine. This is recommended every year.  Tetanus, diphtheria, and acellular pertussis (Tdap, Td) vaccine. You may need a Td booster every 10 years.  Zoster vaccine. You may need this after age  60.  Pneumococcal 13-valent conjugate (PCV13) vaccine. One dose is recommended after age 24.  Pneumococcal polysaccharide (PPSV23) vaccine. One dose is recommended after age 66. Talk to your health care provider about which  screenings and vaccines you need and how often you need them. This information is not intended to replace advice given to you by your health care provider. Make sure you discuss any questions you have with your health care provider. Document Released: 07/24/2015 Document Revised: 03/16/2016 Document Reviewed: 04/28/2015 Elsevier Interactive Patient Education  2017 [place] Prevention in the Home Falls can cause injuries. They can happen to people of all ages. There are many things you can do to make your home safe and to help prevent falls. What can I do on the outside of my home?  Regularly fix the edges of walkways and driveways and fix any cracks.  Remove anything that might make you trip as you walk through a door, such as a raised step or threshold.  Trim any bushes or trees on the path to your home.  Use bright outdoor lighting.  Clear any walking paths of anything that might make someone trip, such as rocks or tools.  Regularly check to see if handrails are loose or broken. Make sure that both sides of any steps have handrails.  Any raised decks and porches should have guardrails on the edges.  Have any leaves, snow, or ice cleared regularly.  Use sand or salt on walking paths during winter.  Clean up any spills in your garage right away. This includes oil or grease spills. What can I do in the bathroom?  Use night lights.  Install grab bars by the toilet and in the tub and shower. Do not use towel bars as grab bars.  Use non-skid mats or decals in the tub or shower.  If you need to sit down in the shower, use a plastic, non-slip stool.  Keep the floor dry. Clean up any water that spills on the floor as soon as it happens.  Remove soap buildup in the tub or shower regularly.  Attach bath mats securely with double-sided non-slip rug tape.  Do not have throw rugs and other things on the floor that can make you trip. What can I do in the bedroom?  Use  night lights.  Make sure that you have a light by your bed that is easy to reach.  Do not use any sheets or blankets that are too big for your bed. They should not hang down onto the floor.  Have a firm chair that has side arms. You can use this for support while you get dressed.  Do not have throw rugs and other things on the floor that can make you trip. What can I do in the kitchen?  Clean up any spills right away.  Avoid walking on wet floors.  Keep items that you use a lot in easy-to-reach places.  If you need to reach something above you, use a strong step stool that has a grab bar.  Keep electrical cords out of the way.  Do not use floor polish or wax that makes floors slippery. If you must use wax, use non-skid floor wax.  Do not have throw rugs and other things on the floor that can make you trip. What can I do with my stairs?  Do not leave any items on the stairs.  Make sure that there are handrails on both sides of the stairs and  use them. Fix handrails that are broken or loose. Make sure that handrails are as long as the stairways.  Check any carpeting to make sure that it is firmly attached to the stairs. Fix any carpet that is loose or worn.  Avoid having throw rugs at the top or bottom of the stairs. If you do have throw rugs, attach them to the floor with carpet tape.  Make sure that you have a light switch at the top of the stairs and the bottom of the stairs. If you do not have them, ask someone to add them for you. What else can I do to help prevent falls?  Wear shoes that:  Do not have high heels.  Have rubber bottoms.  Are comfortable and fit you well.  Are closed at the toe. Do not wear sandals.  If you use a stepladder:  Make sure that it is fully opened. Do not climb a closed stepladder.  Make sure that both sides of the stepladder are locked into place.  Ask someone to hold it for you, if possible.  Clearly mark and make sure that you  can see:  Any grab bars or handrails.  First and last steps.  Where the edge of each step is.  Use tools that help you move around (mobility aids) if they are needed. These include:  Canes.  Walkers.  Scooters.  Crutches.  Turn on the lights when you go into a dark area. Replace any light bulbs as soon as they burn out.  Set up your furniture so you have a clear path. Avoid moving your furniture around.  If any of your floors are uneven, fix them.  If there are any pets around you, be aware of where they are.  Review your medicines with your doctor. Some medicines can make you feel dizzy. This can increase your chance of falling. Ask your doctor what other things that you can do to help prevent falls. This information is not intended to replace advice given to you by your health care provider. Make sure you discuss any questions you have with your health care provider. Document Released: 04/23/2009 Document Revised: 12/03/2015 Document Reviewed: 08/01/2014 Elsevier Interactive Patient Education  2017 [employer].

## 2016-10-20 NOTE — Progress Notes (Signed)
Subjective:   Amanda Strickland is a 83 y.o. female who presents for Medicare Annual (Subsequent) preventive examination.  Review of Systems:  N/A Cardiac Risk Factors include: advanced age (>49men, >97 women);dyslipidemia;hypertension;obesity (BMI >30kg/m2)     Objective:     Vitals: BP (!) 142/66 (BP Location: Right Arm)   Pulse 60   Temp 97.5 F (36.4 C) (Oral)   Ht 5' (1.524 m)   Wt 163 lb 6.4 oz (74.1 kg)   BMI 31.91 kg/m   Body mass index is 31.91 kg/m.   Tobacco History  Smoking Status  . Never Smoker  Smokeless Tobacco  . Never Used     Counseling given: Not Answered   Past Medical History:  Diagnosis Date  . Arthritis   . CHF (congestive heart failure) ([place])   . Depression   . Heart attack 1994  . Hypertension    Past Surgical History:  Procedure Laterality Date  . CORONARY ARTERY BYPASS GRAFT  2001   Family History  Problem Relation Age of Onset  . Heart attack Father   . Breast cancer Maternal Aunt    History  Sexual Activity  . Sexual activity: Not on file    Outpatient Encounter Prescriptions as of 10/20/2016  Medication Sig  . albuterol (PROVENTIL) (2.5 MG/3ML) 0.083% nebulizer solution Take 3 mLs (2.5 mg total) by nebulization every 6 (six) hours as needed for wheezing or shortness of breath.  Marland Kitchen amLODipine (NORVASC) 5 MG tablet Take 1 tablet (5 mg total) by mouth daily.  Marland Kitchen aspirin 81 MG tablet Take 81 mg by mouth daily.  . benazepril-hydrochlorthiazide (LOTENSIN HCT) 20-12.5 MG tablet Take 1 tablet by mouth 2 (two) times daily.  . budesonide-formoterol (SYMBICORT) 160-4.5 MCG/ACT inhaler Inhale 2 puffs into the lungs 2 (two) times daily. (Patient taking differently: Inhale 2 puffs into the lungs 2 (two) times daily. )  . CALCIUM CARBONATE-VIT D-MIN PO Take 1 tablet by mouth daily.   . fluticasone (FLONASE) 50 MCG/ACT nasal spray Place 2 sprays into both nostrils daily. (Patient taking differently: Place 2 sprays into both nostrils daily.  )  . furosemide (LASIX) 40 MG tablet Take 1 tablet (40 mg total) by mouth daily as needed. As needed  . L-Lysine 500 MG CAPS Take by mouth. Reported on 10/15/2015  . Menthol-Methyl Salicylate (MUSCLE RUB EX)   . metoprolol succinate (TOPROL-XL) 50 MG 24 hr tablet Take 1 tablet (50 mg total) by mouth daily. Take with or immediately following a meal.  . Misc Natural Products (OSTEO BI-FLEX ADV JOINT SHIELD) TABS Take by mouth.  . Multiple Vitamin (MULTIVITAMIN) capsule Take 1 capsule by mouth daily.  . Omega-3 Fatty Acids (FISH OIL) 1000 MG CAPS Take 1 capsule by mouth daily.  . potassium chloride SA (K-DUR,KLOR-CON) 20 MEQ tablet Take 1 tablet (20 mEq total) by mouth as needed.  . simvastatin (ZOCOR) 20 MG tablet Take 1 tablet (20 mg total) by mouth at bedtime.  Marland Kitchen Spacer/Aero-Holding Chambers (AEROCHAMBER MINI CHAMBER) DEVI Use spacer with inhalers.  . VENTOLIN HFA 108 (90 Base) MCG/ACT inhaler Inhale 2 puffs into the lungs every 6 (six) hours as needed. As needed  . vitamin E 100 UNIT capsule Take 100 Units by mouth daily.   No facility-administered encounter medications on file as of 10/20/2016.     Activities of Daily Living In your present state of health, do you have any difficulty performing the following activities: 10/20/2016  Hearing? Y  Vision? N  Difficulty concentrating or  making decisions? Y  Walking or climbing stairs? N  Dressing or bathing? N  Doing errands, shopping? N  Preparing Food and eating ? N  Using the Toilet? N  In the past six months, have you accidently leaked urine? N  Do you have problems with loss of bowel control? N  Managing your Medications? N  Managing your Finances? N  Housekeeping or managing your Housekeeping? 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 Exercise           Recommend increasing exercise. Pt to start walking 5 days a week for 20-30 minutes at a time.       Fall Risk Fall Risk  10/20/2016 10/15/2015 06/29/2015  Falls in the past year? No No No  Injury with Fall? - - No   Depression Screen PHQ 2/9 Scores 10/20/2016 10/15/2015 06/29/2015  PHQ - 2 Score 2 0 0  PHQ- 9 Score 3 - -     Cognitive Function     6CIT Screen 10/20/2016  What Year? 0 points  What month? 0 points  What time? 0 points  Count back from 20 0 points  Months in reverse 0 points  Repeat phrase 0 points  Total Score 0    Immunization History  Administered Date(s) Administered  . Influenza Split 03/30/2009, 04/06/2010  . Influenza, High Dose Seasonal PF 05/12/2014, 05/19/2016  . Influenza,inj,Quad PF,36+ Mos 05/12/2015  . Pneumococcal Conjugate-13 05/12/2014  . Td 09/13/2012  . Tdap 09/13/2012   Screening Tests Health Maintenance  Topic Date Due  . PNA vac Low Risk Adult (2 of 2 - PPSV23) 05/13/2015  . INFLUENZA VACCINE  02/08/2017  . TETANUS/TDAP  09/14/2022  . DEXA SCAN  Completed      Plan:  I have personally reviewed and addressed the Medicare Annual Wellness questionnaire and have noted the following in the patient's chart:  A. Medical and social history B. Use of alcohol, tobacco or illicit drugs  C. Current medications and supplements D. Functional ability and status E.  Nutritional status F.  Physical activity G. Advance directives H. List of other physicians I.  Hospitalizations, surgeries, and ER visits in previous 12 months J.  Calcium such as hearing and vision if needed, cognitive and depression L. Referrals  and appointments - none  In addition, I have reviewed and discussed with patient certain preventive protocols, quality metrics, and best practice recommendations. A written personalized care plan for preventive services as well as general preventive health recommendations were provided to patient.  See attached scanned questionnaire for additional information.   Signed,  Fabio Neighbors, LPN Nurse Health Advisor   MD Recommendations:   I have reviewed the documentation and information obtained by Fabio Neighbors, LPN in the above chart and agree as above. I was available for consultation if any questions or issues arose.  Fenton Malling, PA-C

## 2016-11-02 IMAGING — CR DG CHEST 2V
1 series · 2 of 2 positions shown · non-contrast
Comparison: 06/01/2015

CLINICAL DATA: Productive cough with chest congestion. Short of
breath for 1 month. No chest pain.

EXAM:
CHEST  2 VIEW

[Series 1: dg chest 2 view · 0.14mm/px · 2 of 2 slices shown]
[im 1/2]
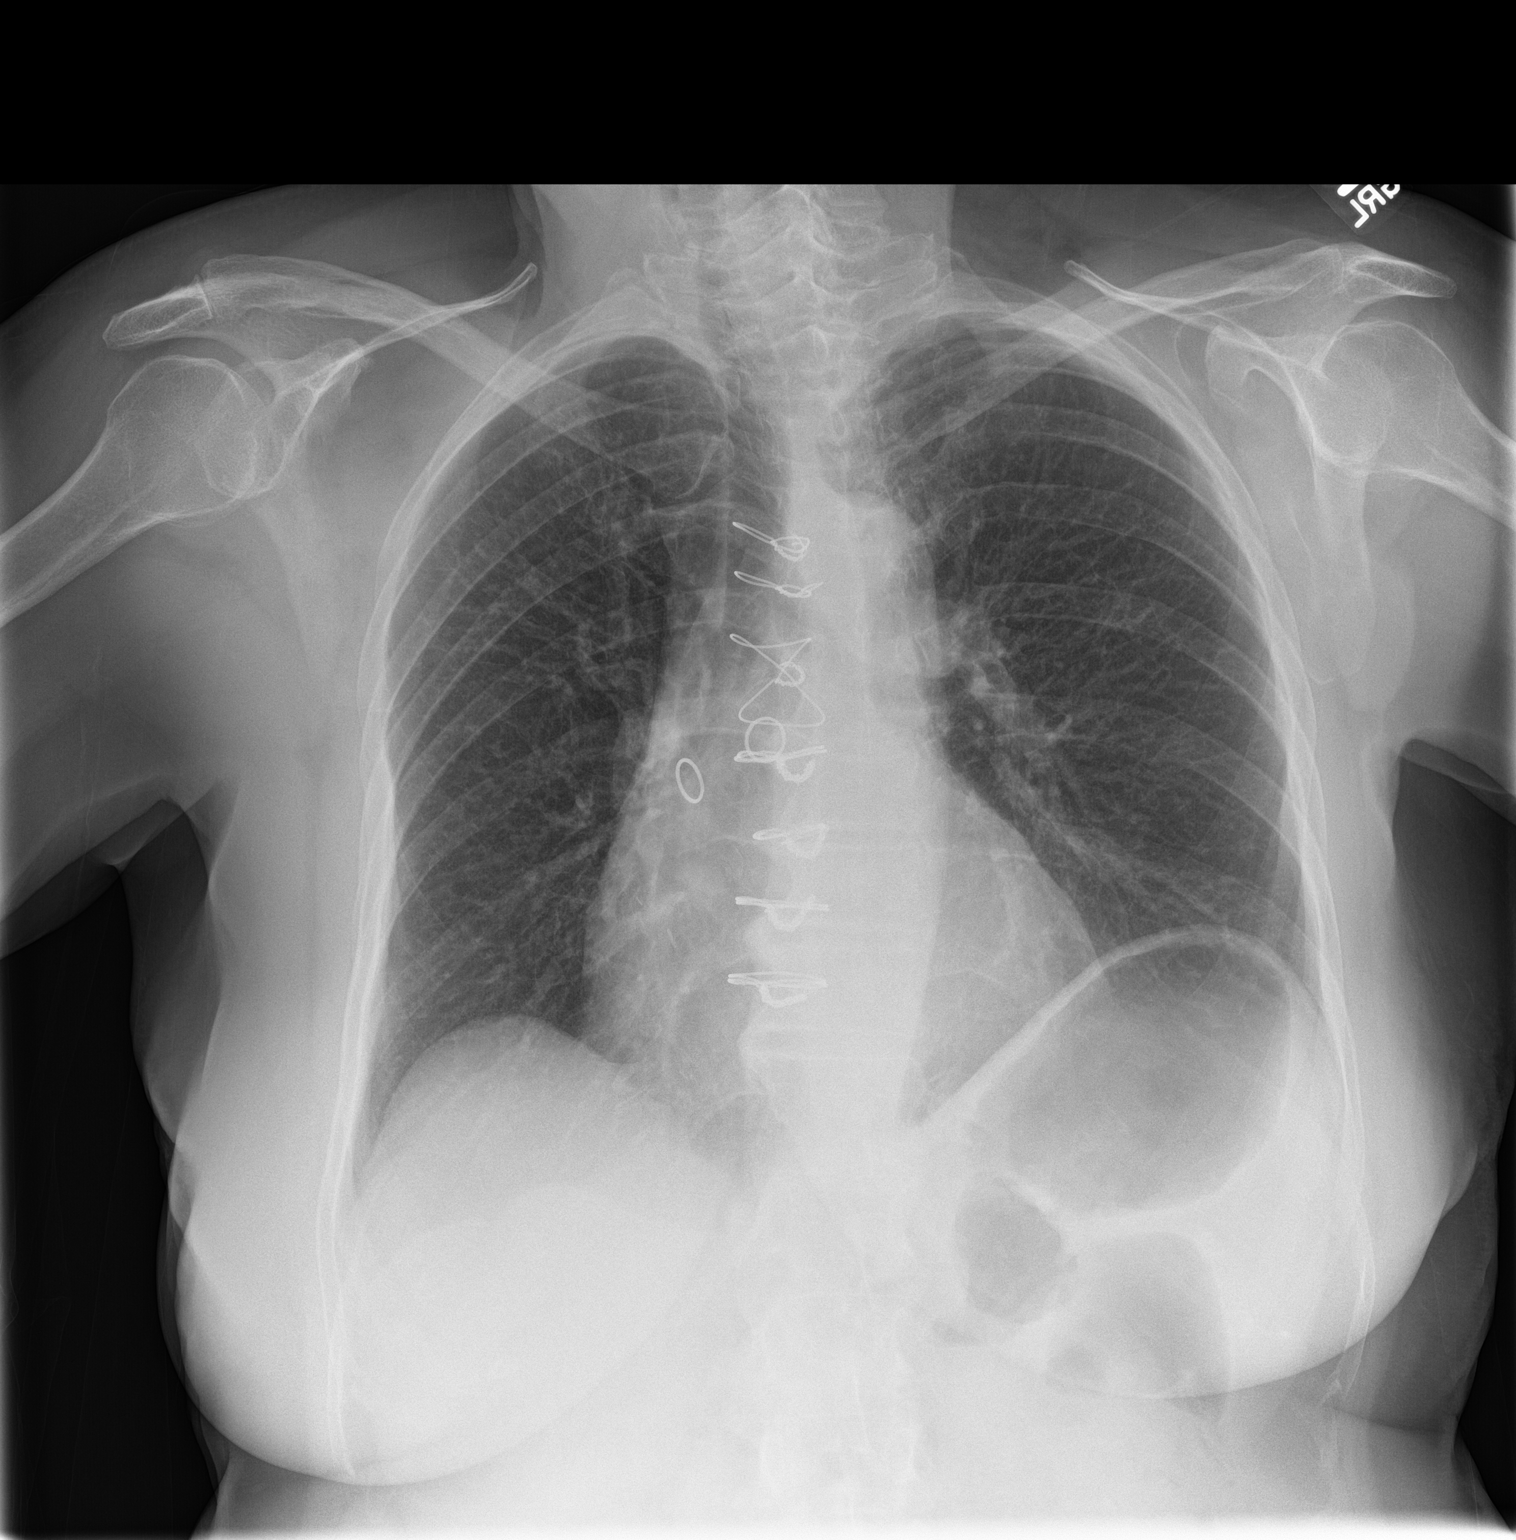
[im 2/2]
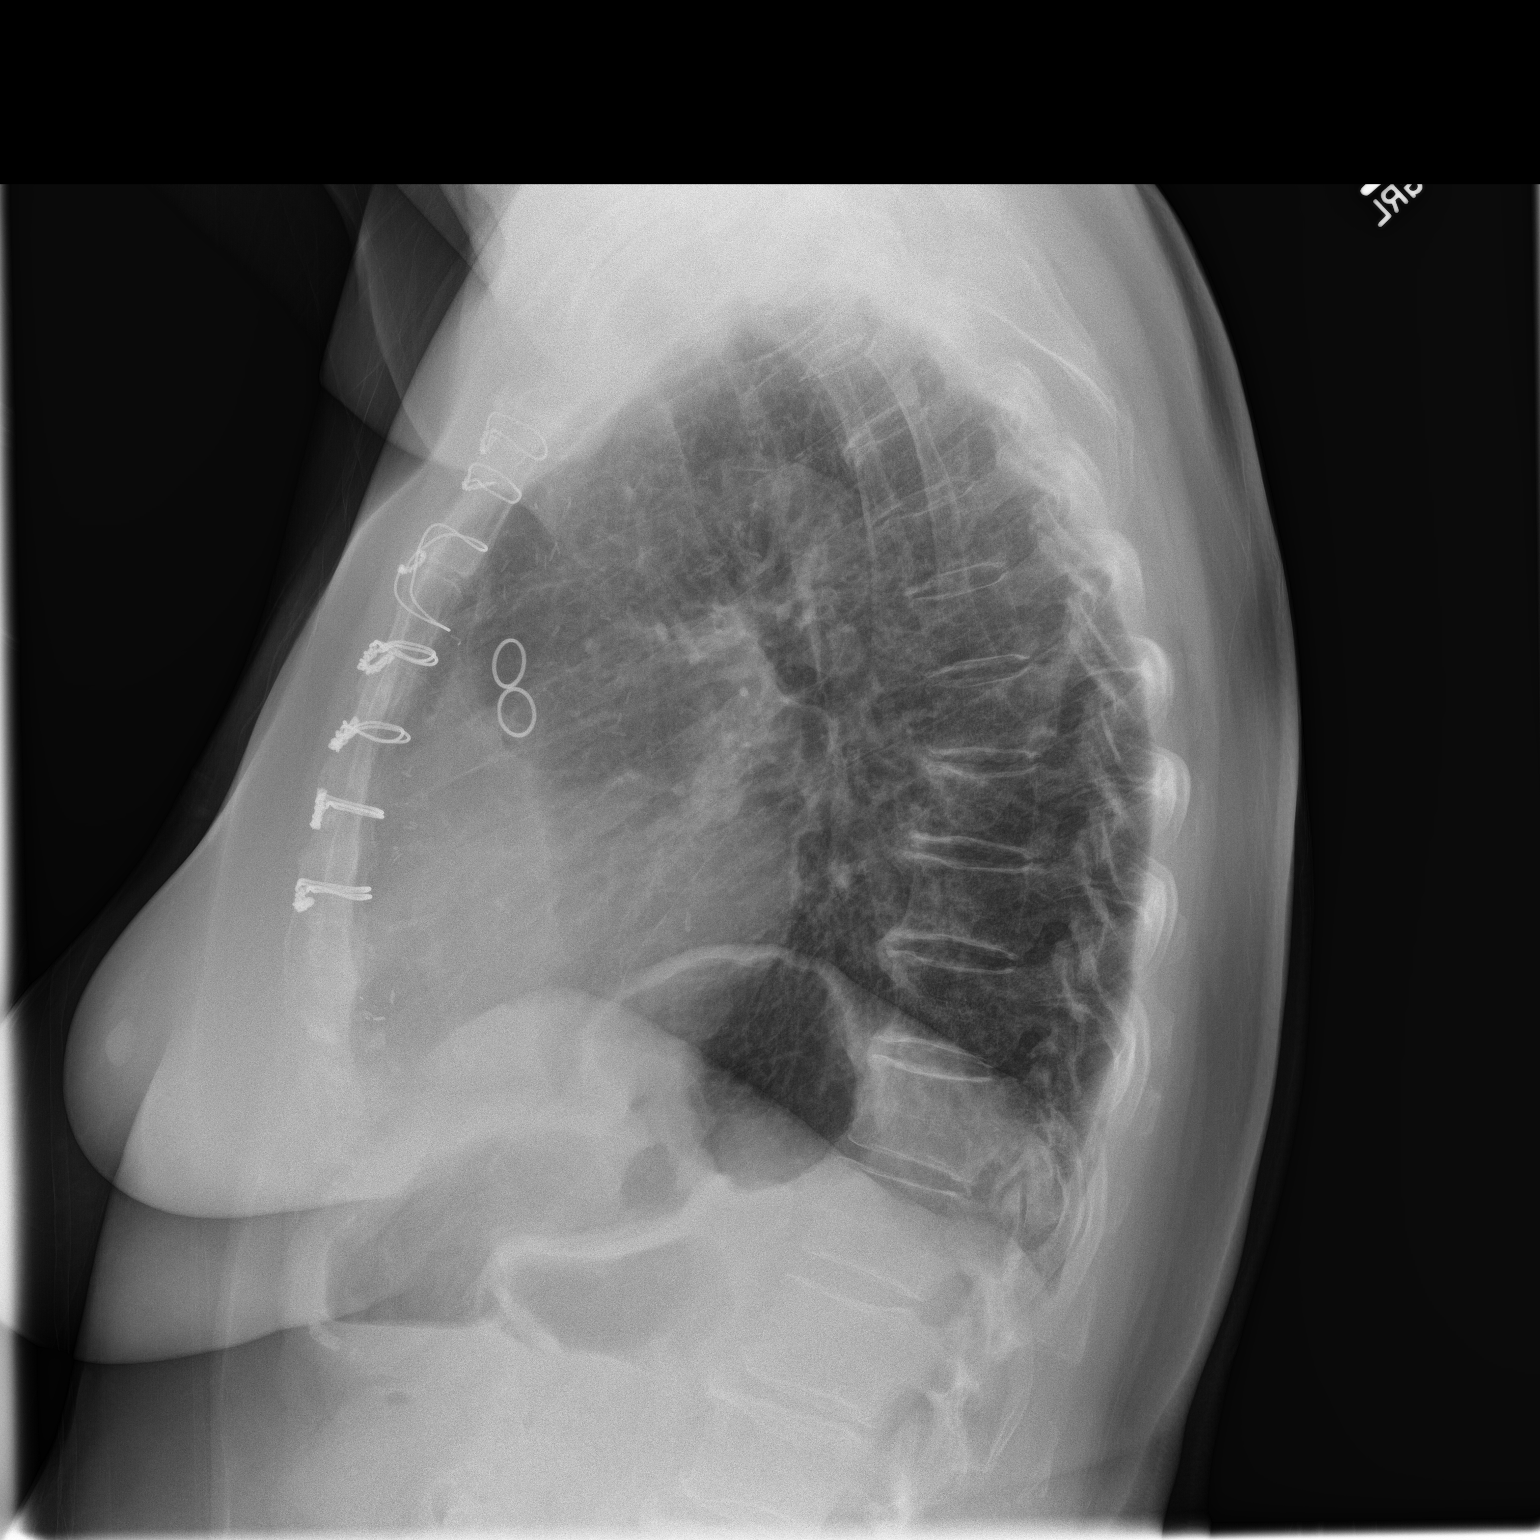

[2 of 2 positions shown; findings below may reference images not displayed]

FINDINGS: Cardiac silhouette is mildly enlarged. There changes from previous
CABG surgery.

No mediastinal or hilar masses or convincing adenopathy.

Lungs are clear.  No pleural effusion or pneumothorax.

Bony thorax demineralized but grossly intact.
IMPRESSION: No acute cardiopulmonary disease.

## 2016-11-07 DIAGNOSIS — N183 Chronic kidney disease, stage 3 (moderate): Secondary | ICD-10-CM | POA: Diagnosis not present

## 2016-11-07 DIAGNOSIS — E7800 Pure hypercholesterolemia, unspecified: Secondary | ICD-10-CM | POA: Diagnosis not present

## 2016-11-07 DIAGNOSIS — I129 Hypertensive chronic kidney disease with stage 1 through stage 4 chronic kidney disease, or unspecified chronic kidney disease: Secondary | ICD-10-CM | POA: Diagnosis not present

## 2016-11-07 DIAGNOSIS — R739 Hyperglycemia, unspecified: Secondary | ICD-10-CM | POA: Diagnosis not present

## 2016-11-08 ENCOUNTER — Telehealth: Payer: Self-pay

## 2016-11-08 LAB — TSH: TSH: 3.14 u[IU]/mL (ref 0.450–4.500)

## 2016-11-08 LAB — HEMOGLOBIN A1C
Est. average glucose Bld gHb Est-mCnc: 123 mg/dL
Hgb A1c MFr Bld: 5.9 % — ABNORMAL HIGH (ref 4.8–5.6)

## 2016-11-08 LAB — COMPREHENSIVE METABOLIC PANEL
ALT: 17 IU/L (ref 0–32)
AST: 21 IU/L (ref 0–40)
Albumin/Globulin Ratio: 1.6 (ref 1.2–2.2)
Albumin: 4.2 g/dL (ref 3.5–4.7)
Alkaline Phosphatase: 87 IU/L (ref 39–117)
BUN/Creatinine Ratio: 13 (ref 12–28)
BUN: 11 mg/dL (ref 8–27)
Bilirubin Total: 1 mg/dL (ref 0.0–1.2)
CHLORIDE: 100 mmol/L (ref 96–106)
CO2: 27 mmol/L (ref 18–29)
CREATININE: 0.83 mg/dL (ref 0.57–1.00)
Calcium: 10 mg/dL (ref 8.7–10.3)
GFR, EST AFRICAN AMERICAN: 75 mL/min/{1.73_m2} (ref >59)
GFR, EST NON AFRICAN AMERICAN: 65 mL/min/{1.73_m2} (ref >59)
GLUCOSE: 118 mg/dL — AB (ref 65–99)
Globulin, Total: 2.7 g/dL (ref 1.5–4.5)
Potassium: 4.1 mmol/L (ref 3.5–5.2)
Sodium: 142 mmol/L (ref 134–144)
TOTAL PROTEIN: 6.9 g/dL (ref 6.0–8.5)

## 2016-11-08 LAB — LIPID PANEL WITH LDL/HDL RATIO
Cholesterol, Total: 168 mg/dL (ref 100–199)
HDL: 51 mg/dL (ref >39)
LDL CALC: 76 mg/dL (ref 0–99)
LDl/HDL Ratio: 1.5 ratio (ref 0.0–3.2)
TRIGLYCERIDES: 203 mg/dL — AB (ref 0–149)
VLDL Cholesterol Cal: 41 mg/dL — ABNORMAL HIGH (ref 5–40)

## 2016-11-08 LAB — CBC WITH DIFFERENTIAL/PLATELET
BASOS: 1 %
Basophils Absolute: 0.1 10*3/uL (ref 0.0–0.2)
EOS (ABSOLUTE): 0.4 10*3/uL (ref 0.0–0.4)
EOS: 6 %
HEMATOCRIT: 43.4 % (ref 34.0–46.6)
Hemoglobin: 14.9 g/dL (ref 11.1–15.9)
Immature Grans (Abs): 0 10*3/uL (ref 0.0–0.1)
Immature Granulocytes: 0 %
LYMPHS ABS: 1.8 10*3/uL (ref 0.7–3.1)
Lymphs: 28 %
MCH: 31.1 pg (ref 26.6–33.0)
MCHC: 34.3 g/dL (ref 31.5–35.7)
MCV: 91 fL (ref 79–97)
MONOS ABS: 0.5 10*3/uL (ref 0.1–0.9)
Monocytes: 8 %
NEUTROS ABS: 3.6 10*3/uL (ref 1.4–7.0)
Neutrophils: 57 %
PLATELETS: 151 10*3/uL (ref 150–379)
RBC: 4.79 x10E6/uL (ref 3.77–5.28)
RDW: 12.5 % (ref 12.3–15.4)
WBC: 6.3 10*3/uL (ref 3.4–10.8)

## 2016-11-08 NOTE — Telephone Encounter (Signed)
-----   Message from Mar Daring, [place] sent at 11/08/2016 10:29 AM EDT ----- All labs are within normal limits and stable.  Thanks! -JB

## 2016-11-08 NOTE — Telephone Encounter (Signed)
[id number] 

## 2016-11-09 NOTE — Telephone Encounter (Signed)
Patient advised.KW 

## 2017-02-10 ENCOUNTER — Ambulatory Visit (INDEPENDENT_AMBULATORY_CARE_PROVIDER_SITE_OTHER): Payer: PPO | Admitting: Podiatry

## 2017-02-10 ENCOUNTER — Ambulatory Visit (INDEPENDENT_AMBULATORY_CARE_PROVIDER_SITE_OTHER): Payer: PPO

## 2017-02-10 ENCOUNTER — Encounter: Payer: Self-pay | Admitting: Podiatry

## 2017-02-10 DIAGNOSIS — M76821 Posterior tibial tendinitis, right leg: Secondary | ICD-10-CM | POA: Diagnosis not present

## 2017-02-10 DIAGNOSIS — M25473 Effusion, unspecified ankle: Secondary | ICD-10-CM

## 2017-02-10 DIAGNOSIS — M659 Synovitis and tenosynovitis, unspecified: Secondary | ICD-10-CM | POA: Diagnosis not present

## 2017-02-10 MED ORDER — BETAMETHASONE SOD PHOS & ACET 6 (3-3) MG/ML IJ SUSP: 3.0000 mg | Freq: Once | INTRAMUSCULAR | Status: DC

## 2017-02-10 NOTE — Progress Notes (Signed)
   Subjective:  Patient presents today for pain and tenderness to the bilateral ankles. Patient relates significant pain and tenderness when walking.  Patient states that this pain is been ongoing for several months. Patient denies trauma. She has no recollection of when specifically the pain began. Patient also states that her ankle swell intermittently.   Objective / Physical Exam:  General:  The patient is alert and oriented x3 in no acute distress. Dermatology:  Skin is warm, dry and supple bilateral lower extremities. Negative for open lesions or macerations. Vascular:  Palpable pedal pulses bilaterally. No edema or erythema noted. Capillary refill within normal limits. Neurological:  Epicritic and protective threshold grossly intact bilaterally.  Musculoskeletal Exam:  Pain on palpation to the anterior lateral medial aspects of the patient's bilateral ankles. Mild edema noted.pain also notedwith palpation along the posterior tibial tendon of the right lower extremity, this is likely contributory to the patient's right ankle pain.  Range of motion within normal limits to all pedal and ankle joints bilateral. Muscle strength 5/5 in all groups bilateral.   Radiographic Exam:  Normal osseous mineralization. Joint spaces preserved. No fracture/dislocation/boney destruction. Given the patient's age her bone structure and joints is in good shape.   Assessment: #1 pain in bilateral ankles with synovitis #2 posterior tibial tendinitis right lower extremity  Plan of Care:  #1 Patient was evaluated. #2 injection of 0.5 mL Celestone Soluspan injected in the patient's bilateral ankle joints. #3 injection of 0.5 mL Celestone Soluspan injected in the posterior tibial tendon sheath of the right lower extremity #4 compression anklet dispensed today #5 patient is to return to clinic in 4 weeks   Edrick Kins, DPM [facility]  Dr. Edrick Kins, [place]                                         [place], [place] [address]                Office [phone]  Fax [phone]

## 2017-03-10 ENCOUNTER — Ambulatory Visit: Payer: PPO | Admitting: Podiatry

## 2017-04-24 ENCOUNTER — Ambulatory Visit (INDEPENDENT_AMBULATORY_CARE_PROVIDER_SITE_OTHER): Payer: PPO | Admitting: Physician Assistant

## 2017-04-24 ENCOUNTER — Encounter: Payer: Self-pay | Admitting: Physician Assistant

## 2017-04-24 VITALS — BP 110/60 | HR 60 | Temp 97.6°F | Resp 16 | Wt 158.0 lb

## 2017-04-24 DIAGNOSIS — I25810 Atherosclerosis of coronary artery bypass graft(s) without angina pectoris: Secondary | ICD-10-CM | POA: Diagnosis not present

## 2017-04-24 DIAGNOSIS — Z6830 Body mass index (BMI) 30.0-30.9, adult: Secondary | ICD-10-CM

## 2017-04-24 DIAGNOSIS — J449 Chronic obstructive pulmonary disease, unspecified: Secondary | ICD-10-CM

## 2017-04-24 DIAGNOSIS — I10 Essential (primary) hypertension: Secondary | ICD-10-CM | POA: Diagnosis not present

## 2017-04-24 DIAGNOSIS — N183 Chronic kidney disease, stage 3 unspecified: Secondary | ICD-10-CM

## 2017-04-24 DIAGNOSIS — E7800 Pure hypercholesterolemia, unspecified: Secondary | ICD-10-CM | POA: Diagnosis not present

## 2017-04-24 DIAGNOSIS — I5032 Chronic diastolic (congestive) heart failure: Secondary | ICD-10-CM

## 2017-04-24 DIAGNOSIS — Z23 Encounter for immunization: Secondary | ICD-10-CM | POA: Diagnosis not present

## 2017-04-24 DIAGNOSIS — R739 Hyperglycemia, unspecified: Secondary | ICD-10-CM | POA: Diagnosis not present

## 2017-04-24 LAB — CBC WITH DIFFERENTIAL/PLATELET
Basophils Absolute: 109 {cells}/uL (ref 0–200)
Basophils Relative: 1.4 %
Eosinophils Absolute: 569 {cells}/uL — ABNORMAL HIGH (ref 15–500)
Eosinophils Relative: 7.3 %
HCT: 40.3 % (ref 35.0–45.0)
Hemoglobin: 14.4 g/dL (ref 11.7–15.5)
Lymphs Abs: 1895 {cells}/uL (ref 850–3900)
MCH: 32 pg (ref 27.0–33.0)
MCHC: 35.7 g/dL (ref 32.0–36.0)
MCV: 89.6 fL (ref 80.0–100.0)
MPV: 11.9 fL (ref 7.5–12.5)
Monocytes Relative: 10.7 %
Neutro Abs: 4391 {cells}/uL (ref 1500–7800)
Neutrophils Relative %: 56.3 %
Platelets: 174 10*3/uL (ref 140–400)
RBC: 4.5 Million/uL (ref 3.80–5.10)
RDW: 12.6 % (ref 11.0–15.0)
Total Lymphocyte: 24.3 %
WBC mixed population: 835 {cells}/uL (ref 200–950)
WBC: 7.8 10*3/uL (ref 3.8–10.8)

## 2017-04-24 LAB — COMPLETE METABOLIC PANEL WITHOUT GFR
AG Ratio: 1.5 (calc) (ref 1.0–2.5)
ALT: 17 U/L (ref 6–29)
AST: 23 U/L (ref 10–35)
Albumin: 4.2 g/dL (ref 3.6–5.1)
Alkaline phosphatase (APISO): 97 U/L (ref 33–130)
BUN/Creatinine Ratio: 14 (calc) (ref 6–22)
BUN: 13 mg/dL (ref 7–25)
CO2: 31 mmol/L (ref 20–32)
Calcium: 9.7 mg/dL (ref 8.6–10.4)
Chloride: 103 mmol/L (ref 98–110)
Creat: 0.91 mg/dL — ABNORMAL HIGH (ref 0.60–0.88)
GFR, Est African American: 68 mL/min/{1.73_m2}
GFR, Est Non African American: 58 mL/min/{1.73_m2} — ABNORMAL LOW
Globulin: 2.8 g/dL (ref 1.9–3.7)
Glucose, Bld: 104 mg/dL — ABNORMAL HIGH (ref 65–99)
Potassium: 4.2 mmol/L (ref 3.5–5.3)
Sodium: 140 mmol/L (ref 135–146)
Total Bilirubin: 1 mg/dL (ref 0.2–1.2)
Total Protein: 7 g/dL (ref 6.1–8.1)

## 2017-04-24 NOTE — Patient Instructions (Signed)
DASH Eating Plan DASH stands for "Dietary Approaches to Stop Hypertension." The DASH eating plan is a healthy eating plan that has been shown to reduce high blood pressure (hypertension). It may also reduce your risk for type 2 diabetes, heart disease, and stroke. The DASH eating plan may also help with weight loss. What are tips for following this plan? General guidelines  Avoid eating more than 2,300 mg (milligrams) of salt (sodium) a day. If you have hypertension, you may need to reduce your sodium intake to 1,500 mg a day.  Limit alcohol intake to no more than 1 drink a day for nonpregnant women and 2 drinks a day for men. One drink equals 12 oz of beer, 5 oz of wine, or 1 oz of hard liquor.  Work with your health care provider to maintain a healthy body weight or to lose weight. Ask what an ideal weight is for you.  Get at least 30 minutes of exercise that causes your heart to beat faster (aerobic exercise) most days of the week. Activities may include walking, swimming, or biking.  Work with your health care provider or diet and nutrition specialist (dietitian) to adjust your eating plan to your individual calorie needs. Reading food labels  Check food labels for the amount of sodium per serving. Choose foods with less than 5 percent of the Daily Value of sodium. Generally, foods with less than 300 mg of sodium per serving fit into this eating plan.  To find whole grains, look for the word "whole" as the first word in the ingredient list. Shopping  Buy products labeled as "low-sodium" or "no salt added."  Buy fresh foods. Avoid canned foods and premade or frozen meals. Cooking  Avoid adding salt when cooking. Use salt-free seasonings or herbs instead of table salt or sea salt. Check with your health care provider or pharmacist before using salt substitutes.  Do not fry foods. Cook foods using healthy methods such as baking, boiling, grilling, and broiling instead.  Cook with  heart-healthy oils, such as olive, canola, soybean, or sunflower oil. Meal planning   Eat a balanced diet that includes: ? 5 or more servings of fruits and vegetables each day. At each meal, try to fill half of your plate with fruits and vegetables. ? Up to 6-8 servings of whole grains each day. ? Less than 6 oz of lean meat, poultry, or fish each day. A 3-oz serving of meat is about the same size as a deck of cards. One egg equals 1 oz. ? 2 servings of low-fat dairy each day. ? A serving of nuts, seeds, or beans 5 times each week. ? Heart-healthy fats. Healthy fats called Omega-3 fatty acids are found in foods such as flaxseeds and coldwater fish, like sardines, salmon, and mackerel.  Limit how much you eat of the following: ? Canned or prepackaged foods. ? Food that is high in trans fat, such as fried foods. ? Food that is high in saturated fat, such as fatty meat. ? Sweets, desserts, sugary drinks, and other foods with added sugar. ? Full-fat dairy products.  Do not salt foods before eating.  Try to eat at least 2 vegetarian meals each week.  Eat more home-cooked food and less restaurant, buffet, and fast food.  When eating at a restaurant, ask that your food be prepared with less salt or no salt, if possible. What foods are recommended? The items listed may not be a complete list. Talk with your dietitian about what   dietary choices are best for you. Grains Whole-grain or whole-wheat bread. Whole-grain or whole-wheat pasta. Brown rice. Oatmeal. Quinoa. Bulgur. Whole-grain and low-sodium cereals. Pita bread. Low-fat, low-sodium crackers. Whole-wheat flour tortillas. Vegetables Fresh or frozen vegetables (raw, steamed, roasted, or grilled). Low-sodium or reduced-sodium tomato and vegetable juice. Low-sodium or reduced-sodium tomato sauce and tomato paste. Low-sodium or reduced-sodium canned vegetables. Fruits All fresh, dried, or frozen fruit. Canned fruit in natural juice (without  added sugar). Meat and other protein foods Skinless chicken or turkey. Ground chicken or turkey. Pork with fat trimmed off. Fish and seafood. Egg whites. Dried beans, peas, or lentils. Unsalted nuts, nut butters, and seeds. Unsalted canned beans. Lean cuts of beef with fat trimmed off. Low-sodium, lean deli meat. Dairy Low-fat (1%) or fat-free (skim) milk. Fat-free, low-fat, or reduced-fat cheeses. Nonfat, low-sodium ricotta or cottage cheese. Low-fat or nonfat yogurt. Low-fat, low-sodium cheese. Fats and oils Soft margarine without trans fats. Vegetable oil. Low-fat, reduced-fat, or light mayonnaise and salad dressings (reduced-sodium). Canola, safflower, olive, soybean, and sunflower oils. Avocado. Seasoning and other foods Herbs. Spices. Seasoning mixes without salt. Unsalted popcorn and pretzels. Fat-free sweets. What foods are not recommended? The items listed may not be a complete list. Talk with your dietitian about what dietary choices are best for you. Grains Baked goods made with fat, such as croissants, muffins, or some breads. Dry pasta or rice meal packs. Vegetables Creamed or fried vegetables. Vegetables in a cheese sauce. Regular canned vegetables (not low-sodium or reduced-sodium). Regular canned tomato sauce and paste (not low-sodium or reduced-sodium). Regular tomato and vegetable juice (not low-sodium or reduced-sodium). Pickles. Olives. Fruits Canned fruit in a light or heavy syrup. Fried fruit. Fruit in cream or butter sauce. Meat and other protein foods Fatty cuts of meat. Ribs. Fried meat. Bacon. Sausage. Bologna and other processed lunch meats. Salami. Fatback. Hotdogs. Bratwurst. Salted nuts and seeds. Canned beans with added salt. Canned or smoked fish. Whole eggs or egg yolks. Chicken or turkey with skin. Dairy Whole or 2% milk, cream, and half-and-half. Whole or full-fat cream cheese. Whole-fat or sweetened yogurt. Full-fat cheese. Nondairy creamers. Whipped toppings.  Processed cheese and cheese spreads. Fats and oils Butter. Stick margarine. Lard. Shortening. Ghee. Bacon fat. Tropical oils, such as coconut, palm kernel, or palm oil. Seasoning and other foods Salted popcorn and pretzels. Onion salt, garlic salt, seasoned salt, table salt, and sea salt. Worcestershire sauce. Tartar sauce. Barbecue sauce. Teriyaki sauce. Soy sauce, including reduced-sodium. Steak sauce. Canned and packaged gravies. Fish sauce. Oyster sauce. Cocktail sauce. Horseradish that you find on the shelf. Ketchup. Mustard. Meat flavorings and tenderizers. Bouillon cubes. Hot sauce and Tabasco sauce. Premade or packaged marinades. Premade or packaged taco seasonings. Relishes. Regular salad dressings. Where to find more information:  National Heart, Lung, and Blood Institute: www.nhlbi.nih.gov  American Heart Association: www.heart.org Summary  The DASH eating plan is a healthy eating plan that has been shown to reduce high blood pressure (hypertension). It may also reduce your risk for type 2 diabetes, heart disease, and stroke.  With the DASH eating plan, you should limit salt (sodium) intake to 2,300 mg a day. If you have hypertension, you may need to reduce your sodium intake to 1,500 mg a day.  When on the DASH eating plan, aim to eat more fresh fruits and vegetables, whole grains, lean proteins, low-fat dairy, and heart-healthy fats.  Work with your health care provider or diet and nutrition specialist (dietitian) to adjust your eating plan to your individual   calorie needs. This information is not intended to replace advice given to you by your health care provider. Make sure you discuss any questions you have with your health care provider. Document Released: 06/16/2011 Document Revised: 06/20/2016 Document Reviewed: 06/20/2016 Elsevier Interactive Patient Education  2017 Elsevier Inc.  

## 2017-04-24 NOTE — Progress Notes (Signed)
[name] Female    DOB: 15-Jul-1930   83 y.o.   MRN: [id number] Visit Date: 04/24/2017  Today's Provider: Mar Daring, PA-C   Chief Complaint  Patient presents with  . Follow-up    HTN and Hypercholesteremia   Subjective:    HPI   Hypertension, follow-up:  BP Readings from Last 3 Encounters:  04/24/17 110/60  10/20/16 (!) 142/66  08/01/16 140/70    She was last seen for hypertension 6 months ago.  BP at that visit was 142/66. Management since that visit includes none.She reports excellent compliance with treatment. She is not having side effects.  She is experiencing none.  Patient denies chest pain, chest pressure/discomfort, exertional chest pressure/discomfort, fatigue, irregular heart beat, lower extremity edema, near-syncope and palpitations.   Cardiovascular risk factors include advanced age (older than 19 for men, 25 for women), dyslipidemia and family history of premature cardiovascular disease.   ------------------------------------------------------------------------    Lipid/Cholesterol, Follow-up:   Last seen for this 6 months ago.  Management since that visit includes continue medication.  Last Lipid Panel:    Component Value Date/Time   CHOL 168 11/07/2016 0933   TRIG 203 (H) 11/07/2016 0933   HDL 51 11/07/2016 0933   LDLCALC 76 11/07/2016 0933    She reports excellent compliance with treatment. She is not having side effects.      No Known Allergies   Current Outpatient Prescriptions:  .  albuterol (PROVENTIL) (2.5 MG/3ML) 0.083% nebulizer solution, Take 3 mLs (2.5 mg total) by nebulization every 6 (six) hours as needed for wheezing or shortness of breath., Disp: 150 mL, Rfl: 1 .  amLODipine (NORVASC) 5 MG tablet, Take 1 tablet (5 mg total) by mouth daily., Disp: 90 tablet, Rfl: 3 .  aspirin 81 MG tablet, Take 81 mg by mouth daily., Disp: , Rfl:  .  benazepril-hydrochlorthiazide (LOTENSIN HCT) 20-12.5 MG  tablet, Take 1 tablet by mouth 2 (two) times daily., Disp: 180 tablet, Rfl: 3 .  budesonide-formoterol (SYMBICORT) 160-4.5 MCG/ACT inhaler, Inhale 2 puffs into the lungs 2 (two) times daily. (Patient taking differently: Inhale 2 puffs into the lungs 2 (two) times daily. ), Disp: 3 Inhaler, Rfl: 3 .  CALCIUM CARBONATE-VIT D-MIN PO, Take 1 tablet by mouth daily. , Disp: , Rfl:  .  fluticasone (FLONASE) 50 MCG/ACT nasal spray, Place 2 sprays into both nostrils daily. (Patient taking differently: Place 2 sprays into both nostrils daily. ), Disp: 16 g, Rfl: 6 .  furosemide (LASIX) 40 MG tablet, Take 1 tablet (40 mg total) by mouth daily as needed. As needed, Disp: 30 tablet, Rfl: 0 .  L-Lysine 500 MG CAPS, Take by mouth. Reported on 10/15/2015, Disp: , Rfl:  .  Menthol-Methyl Salicylate (MUSCLE RUB EX), , Disp: , Rfl:  .  metoprolol succinate (TOPROL-XL) 50 MG 24 hr tablet, Take 1 tablet (50 mg total) by mouth daily. Take with or immediately following a meal., Disp: 90 tablet, Rfl: 3 .  Misc Natural Products (OSTEO BI-FLEX ADV JOINT SHIELD) TABS, Take by mouth., Disp: , Rfl:  .  Multiple Vitamin (MULTIVITAMIN) capsule, Take 1 capsule by mouth daily., Disp: , Rfl:  .  Omega-3 Fatty Acids (FISH OIL) 1000 MG CAPS, Take 1 capsule by mouth daily., Disp: , Rfl:  .  potassium chloride SA (K-DUR,KLOR-CON) 20 MEQ tablet, Take 1 tablet (20 mEq total) by mouth as needed., Disp: 30 tablet, Rfl: 1 .  simvastatin (ZOCOR) 20 MG tablet, Take  1 tablet (20 mg total) by mouth at bedtime., Disp: 90 tablet, Rfl: 3 .  Spacer/Aero-Holding Chambers (AEROCHAMBER MINI CHAMBER) DEVI, Use spacer with inhalers., Disp: 1 Device, Rfl: 0 .  VENTOLIN HFA 108 (90 Base) MCG/ACT inhaler, Inhale 2 puffs into the lungs every 6 (six) hours as needed. As needed, Disp: 3 Inhaler, Rfl: 4 .  vitamin E 100 UNIT capsule, Take 100 Units by mouth daily., Disp: , Rfl:   Current Facility-Administered Medications:  .  betamethasone acetate-betamethasone  sodium phosphate (CELESTONE) injection 3 mg, 3 mg, Intramuscular, Once, Edrick Kins, DPM  Review of Systems  Constitutional: Negative for fatigue.  Eyes: Negative for visual disturbance.  Cardiovascular: Negative for chest pain, palpitations and leg swelling.  Neurological: Negative for dizziness, light-headedness and headaches.    Social History  Substance Use Topics  . Smoking status: Never Smoker  . Smokeless tobacco: Never Used  . Alcohol use No   Objective:   BP 110/60 (BP Location: Left Arm, Patient Position: Sitting, Cuff Size: Large)   Pulse 60   Temp 97.6 F (36.4 C) (Oral)   Resp 16   Wt 158 lb (71.7 kg)   BMI 30.86 kg/m    Physical Exam  Constitutional: She appears well-developed and well-nourished. No distress.  Neck: Normal range of motion. Neck supple. No JVD present. No tracheal deviation present. No thyromegaly present.  Cardiovascular: Normal rate, regular rhythm and normal heart sounds.  Exam reveals no gallop and no friction rub.   No murmur heard. Pulmonary/Chest: Effort normal and breath sounds normal. No respiratory distress. She has no wheezes. She has no rales.  Musculoskeletal: She exhibits edema.  Swelling noted around ankles bilaterally just over ankle joints; previously diagnosed with bilateral synovitis. Swelling consistent with that. No pitting edema.  Lymphadenopathy:    She has no cervical adenopathy.  Skin: She is not diaphoretic.  Psychiatric: She has a normal mood and affect. Her behavior is normal. Judgment and thought content normal.  Vitals reviewed.      Assessment & Plan:     1. Chronic diastolic heart failure (HCC) Stable. Continue metoprolol 50mg  daily and furosemide 40mg .   2. Essential hypertension Stable. Continue metoprolol 50mg  daily, amlodipine 5mg  daily and benazepril-hctz 20-12.5mg  daily. Will check labs as below and f/u pending results. - CBC w/Diff/Platelet - COMPLETE METABOLIC PANEL WITH GFR  3. Coronary  artery disease involving coronary bypass graft of native heart without angina pectoris Will check labs as below and f/u pending results. - CBC w/Diff/Platelet  4. Chronic kidney disease (CKD), stage III (moderate) (HCC) Possibly overdiuresis. Will check labs as below for stability. Patient on furosemide 40mg  and hctz 12.5mg  (in combo pill with benazepril). Will check labs as below and f/u pending results. - COMPLETE METABOLIC PANEL WITH GFR  5. Blood glucose elevated Will check labs as below and f/u pending results. - COMPLETE METABOLIC PANEL WITH GFR  6. Hypercholesteremia Will recheck cholesterol in 11/2017. Stable at last check. Continue simvastatin 20mg .  - COMPLETE METABOLIC PANEL WITH GFR  7. Chronic obstructive pulmonary disease, unspecified COPD type (HCC) Stable on Symbicort 2 puffs BID and ventolin haler prn.   8. BMI 30.0-30.9,adult Counseled patient on healthy lifestyle modifications including dieting and exercise. DASH diet instructions printed for patient.   9. Need for influenza vaccination Flu vaccine given today without complication. Patient sat upright for 15 minutes to check for adverse reaction before being released. - Flu vaccine HIGH DOSE PF       Anderson Malta  Dorothy Puffer, PA-C  [facility]

## 2017-04-25 ENCOUNTER — Telehealth: Payer: Self-pay

## 2017-04-25 NOTE — Telephone Encounter (Signed)
-----   Message from Mar Daring, PA-C sent at 04/25/2017  8:53 AM EDT ----- All labs are within normal limits and stable.  Thanks! -JB

## 2017-04-25 NOTE — Telephone Encounter (Signed)
LM, Will try later to contact patient since we are not able to receive calls.  Thanks,  -Francille Wittmann

## 2017-05-03 NOTE — Telephone Encounter (Signed)
Patient advised as below.  

## 2017-05-09 DIAGNOSIS — L578 Other skin changes due to chronic exposure to nonionizing radiation: Secondary | ICD-10-CM | POA: Diagnosis not present

## 2017-05-09 DIAGNOSIS — L820 Inflamed seborrheic keratosis: Secondary | ICD-10-CM | POA: Diagnosis not present

## 2017-05-09 DIAGNOSIS — L408 Other psoriasis: Secondary | ICD-10-CM | POA: Diagnosis not present

## 2017-05-09 DIAGNOSIS — I872 Venous insufficiency (chronic) (peripheral): Secondary | ICD-10-CM | POA: Diagnosis not present

## 2017-06-08 ENCOUNTER — Telehealth: Payer: Self-pay | Admitting: Physician Assistant

## 2017-06-08 NOTE — Telephone Encounter (Signed)
Pt daughter Karena Addison called stating pt labs for DOS 04/24/17 were not covered.  Please advise.  CB#[phone]/MW

## 2017-06-09 NOTE — Telephone Encounter (Signed)
I dont know why they would not have been covered. But normally when not covered the lab will send [place] information for [place] to update to try to get coverage. I have not received anything on her yet. She can file for appeal to be covered.

## 2017-06-09 NOTE — Telephone Encounter (Signed)
RRN:[id number] with Karena Addison, patient's daughter as directed below. Per [place] she is going to wait on the bill and see if the ICD's codes the same.   Thanks,  -Sharnita Bogucki

## 2017-07-24 ENCOUNTER — Other Ambulatory Visit: Payer: Self-pay

## 2017-07-24 ENCOUNTER — Encounter: Payer: Self-pay | Admitting: Family Medicine

## 2017-07-24 ENCOUNTER — Encounter: Payer: Self-pay | Admitting: Emergency Medicine

## 2017-07-24 ENCOUNTER — Ambulatory Visit (INDEPENDENT_AMBULATORY_CARE_PROVIDER_SITE_OTHER): Payer: PPO | Admitting: Family Medicine

## 2017-07-24 ENCOUNTER — Emergency Department: Payer: PPO

## 2017-07-24 ENCOUNTER — Inpatient Hospital Stay
Admit: 2017-07-24 | Discharge: 2017-07-24 | Disposition: A | Discharge status: Home / Self Care | Payer: PPO | Attending: Internal Medicine | Admitting: Internal Medicine

## 2017-07-24 ENCOUNTER — Inpatient Hospital Stay
Admission: EM | Admit: 2017-07-24 | Discharge: 2017-07-25 | DRG: 291 | Disposition: A | Discharge status: Home / Self Care | Payer: PPO | Attending: Family Medicine | Admitting: Family Medicine

## 2017-07-24 VITALS — BP 128/68 | HR 55 | Temp 97.5°F | Wt 164.4 lb

## 2017-07-24 DIAGNOSIS — J9601 Acute respiratory failure with hypoxia: Secondary | ICD-10-CM | POA: Diagnosis present

## 2017-07-24 DIAGNOSIS — R0609 Other forms of dyspnea: Secondary | ICD-10-CM

## 2017-07-24 DIAGNOSIS — I10 Essential (primary) hypertension: Secondary | ICD-10-CM | POA: Diagnosis not present

## 2017-07-24 DIAGNOSIS — Z7982 Long term (current) use of aspirin: Secondary | ICD-10-CM

## 2017-07-24 DIAGNOSIS — I5033 Acute on chronic diastolic (congestive) heart failure: Secondary | ICD-10-CM | POA: Diagnosis not present

## 2017-07-24 DIAGNOSIS — R0602 Shortness of breath: Secondary | ICD-10-CM | POA: Diagnosis not present

## 2017-07-24 DIAGNOSIS — I509 Heart failure, unspecified: Secondary | ICD-10-CM | POA: Diagnosis not present

## 2017-07-24 DIAGNOSIS — I5022 Chronic systolic (congestive) heart failure: Secondary | ICD-10-CM | POA: Diagnosis not present

## 2017-07-24 DIAGNOSIS — Z79899 Other long term (current) drug therapy: Secondary | ICD-10-CM

## 2017-07-24 DIAGNOSIS — E7800 Pure hypercholesterolemia, unspecified: Secondary | ICD-10-CM | POA: Diagnosis not present

## 2017-07-24 DIAGNOSIS — Z951 Presence of aortocoronary bypass graft: Secondary | ICD-10-CM

## 2017-07-24 DIAGNOSIS — I110 Hypertensive heart disease with heart failure: Principal | ICD-10-CM | POA: Diagnosis present

## 2017-07-24 DIAGNOSIS — J189 Pneumonia, unspecified organism: Secondary | ICD-10-CM

## 2017-07-24 DIAGNOSIS — J181 Lobar pneumonia, unspecified organism: Secondary | ICD-10-CM | POA: Diagnosis present

## 2017-07-24 DIAGNOSIS — I252 Old myocardial infarction: Secondary | ICD-10-CM

## 2017-07-24 DIAGNOSIS — F329 Major depressive disorder, single episode, unspecified: Secondary | ICD-10-CM | POA: Diagnosis not present

## 2017-07-24 DIAGNOSIS — I2510 Atherosclerotic heart disease of native coronary artery without angina pectoris: Secondary | ICD-10-CM | POA: Diagnosis present

## 2017-07-24 DIAGNOSIS — J209 Acute bronchitis, unspecified: Secondary | ICD-10-CM | POA: Diagnosis not present

## 2017-07-24 HISTORY — DX: Atherosclerotic heart disease of native coronary artery without angina pectoris: I25.10

## 2017-07-24 LAB — BASIC METABOLIC PANEL
ANION GAP: 12 (ref 5–15)
BUN: 14 mg/dL (ref 6–20)
CHLORIDE: 102 mmol/L (ref 101–111)
CO2: 26 mmol/L (ref 22–32)
Calcium: 10 mg/dL (ref 8.9–10.3)
Creatinine, Ser: 0.86 mg/dL (ref 0.44–1.00)
GFR calc Af Amer: >60 mL/min (ref >60)
Glucose, Bld: 124 mg/dL — ABNORMAL HIGH (ref 65–99)
POTASSIUM: 3.4 mmol/L — AB (ref 3.5–5.1)
SODIUM: 140 mmol/L (ref 135–145)

## 2017-07-24 LAB — CBC
HCT: 45.3 % (ref 35.0–47.0)
HEMOGLOBIN: 15.4 g/dL (ref 12.0–16.0)
MCH: 31.6 pg (ref 26.0–34.0)
MCHC: 33.9 g/dL (ref 32.0–36.0)
MCV: 93 fL (ref 80.0–100.0)
Platelets: 183 10*3/uL (ref 150–440)
RBC: 4.86 MIL/uL (ref 3.80–5.20)
RDW: 13.2 % (ref 11.5–14.5)
WBC: 10 10*3/uL (ref 3.6–11.0)

## 2017-07-24 LAB — DIFFERENTIAL
BASOS PCT: 1 %
Basophils Absolute: 0.1 10*3/uL (ref 0–0.1)
EOS PCT: 4 %
Eosinophils Absolute: 0.4 10*3/uL (ref 0–0.7)
LYMPHS PCT: 19 %
Lymphs Abs: 1.6 10*3/uL (ref 1.0–3.6)
Monocytes Absolute: 0.7 10*3/uL (ref 0.2–0.9)
Monocytes Relative: 9 %
NEUTROS ABS: 5.8 10*3/uL (ref 1.4–6.5)
NEUTROS PCT: 67 %

## 2017-07-24 LAB — INFLUENZA PANEL BY PCR (TYPE A & B)
INFLAPCR: NEGATIVE
INFLBPCR: NEGATIVE

## 2017-07-24 LAB — BRAIN NATRIURETIC PEPTIDE: B Natriuretic Peptide: 531 pg/mL — ABNORMAL HIGH (ref 0.0–100.0)

## 2017-07-24 LAB — LACTIC ACID, PLASMA
LACTIC ACID, VENOUS: 1.9 mmol/L (ref 0.5–1.9)
Lactic Acid, Venous: 1.3 mmol/L (ref 0.5–1.9)

## 2017-07-24 LAB — TROPONIN I: Troponin I: <0.03 ng/mL (ref <0.03)

## 2017-07-24 MED ORDER — HYDROCHLOROTHIAZIDE 12.5 MG PO CAPS
12.5000 mg | ORAL_CAPSULE | Freq: Every day | ORAL | Status: DC
  Administered 2017-07-25: 12.5 mg via ORAL
  Filled 2017-07-24: qty 1

## 2017-07-24 MED ORDER — DEXTROSE 5 % IV SOLN
500.0000 mg | INTRAVENOUS | Status: DC
  Filled 2017-07-24: qty 500

## 2017-07-24 MED ORDER — ACETAMINOPHEN 650 MG RE SUPP: 650.0000 mg | Freq: Four times a day (QID) | RECTAL | Status: DC | PRN

## 2017-07-24 MED ORDER — ONDANSETRON HCL 4 MG PO TABS: 4.0000 mg | ORAL_TABLET | Freq: Four times a day (QID) | ORAL | Status: DC | PRN

## 2017-07-24 MED ORDER — HYDROCOD POLST-CPM POLST ER 10-8 MG/5ML PO SUER
5.0000 mL | Freq: Two times a day (BID) | ORAL | Status: DC
  Administered 2017-07-24: 5 mL via ORAL
  Filled 2017-07-24 (×3): qty 5

## 2017-07-24 MED ORDER — FUROSEMIDE 10 MG/ML IJ SOLN
40.0000 mg | Freq: Every day | INTRAMUSCULAR | Status: DC
  Administered 2017-07-24 – 2017-07-25 (×2): 40 mg via INTRAVENOUS
  Filled 2017-07-24 (×2): qty 4

## 2017-07-24 MED ORDER — POTASSIUM CHLORIDE CRYS ER 20 MEQ PO TBCR
40.0000 meq | EXTENDED_RELEASE_TABLET | Freq: Every day | ORAL | Status: DC
  Administered 2017-07-24 – 2017-07-25 (×2): 40 meq via ORAL
  Filled 2017-07-24 (×2): qty 2

## 2017-07-24 MED ORDER — SODIUM CHLORIDE 0.9% FLUSH
3.0000 mL | Freq: Two times a day (BID) | INTRAVENOUS | Status: DC
  Administered 2017-07-24 – 2017-07-25 (×2): 3 mL via INTRAVENOUS

## 2017-07-24 MED ORDER — VENTOLIN HFA 108 (90 BASE) MCG/ACT IN AERS: 2.0000 | INHALATION_SPRAY | Freq: Four times a day (QID) | RESPIRATORY_TRACT | 4 refills | Status: DC | PRN

## 2017-07-24 MED ORDER — VITAMIN E 45 MG (100 UNIT) PO CAPS
100.0000 [IU] | ORAL_CAPSULE | Freq: Every day | ORAL | Status: DC
  Filled 2017-07-24 (×2): qty 1

## 2017-07-24 MED ORDER — FUROSEMIDE 10 MG/ML IJ SOLN
60.0000 mg | Freq: Once | INTRAMUSCULAR | Status: AC
  Administered 2017-07-24: 60 mg via INTRAVENOUS
  Filled 2017-07-24: qty 8

## 2017-07-24 MED ORDER — CEFTRIAXONE SODIUM IN DEXTROSE 20 MG/ML IV SOLN
1.0000 g | Freq: Once | INTRAVENOUS | Status: AC
  Administered 2017-07-24: 1 g via INTRAVENOUS
  Filled 2017-07-24: qty 50

## 2017-07-24 MED ORDER — ADULT MULTIVITAMIN W/MINERALS CH
1.0000 | ORAL_TABLET | Freq: Every day | ORAL | Status: DC
Start: 2017-07-24 — End: 2017-07-25
  Filled 2017-07-24 (×2): qty 1

## 2017-07-24 MED ORDER — BENAZEPRIL-HYDROCHLOROTHIAZIDE 20-12.5 MG PO TABS: 1.0000 | ORAL_TABLET | Freq: Two times a day (BID) | ORAL | Status: DC

## 2017-07-24 MED ORDER — METOPROLOL SUCCINATE ER 50 MG PO TB24: 50.0000 mg | ORAL_TABLET | Freq: Every day | ORAL | 3 refills | Status: DC

## 2017-07-24 MED ORDER — BENAZEPRIL HCL 20 MG PO TABS
20.0000 mg | ORAL_TABLET | Freq: Every day | ORAL | Status: DC
  Administered 2017-07-25: 20 mg via ORAL
  Filled 2017-07-24: qty 1

## 2017-07-24 MED ORDER — DEXTROSE 5 % IV SOLN
500.0000 mg | Freq: Once | INTRAVENOUS | Status: AC
  Administered 2017-07-24: 500 mg via INTRAVENOUS
  Filled 2017-07-24: qty 500

## 2017-07-24 MED ORDER — SIMVASTATIN 20 MG PO TABS
20.0000 mg | ORAL_TABLET | Freq: Every day | ORAL | Status: DC
  Administered 2017-07-24: 20 mg via ORAL
  Filled 2017-07-24: qty 1

## 2017-07-24 MED ORDER — ACETAMINOPHEN 325 MG PO TABS: 650.0000 mg | ORAL_TABLET | Freq: Four times a day (QID) | ORAL | Status: DC | PRN

## 2017-07-24 MED ORDER — MOMETASONE FURO-FORMOTEROL FUM 200-5 MCG/ACT IN AERO
2.0000 | INHALATION_SPRAY | Freq: Two times a day (BID) | RESPIRATORY_TRACT | Status: DC
  Administered 2017-07-24 – 2017-07-25 (×2): 2 via RESPIRATORY_TRACT
  Filled 2017-07-24: qty 8.8

## 2017-07-24 MED ORDER — BUDESONIDE-FORMOTEROL FUMARATE 160-4.5 MCG/ACT IN AERO
2.0000 | INHALATION_SPRAY | Freq: Two times a day (BID) | RESPIRATORY_TRACT | 3 refills | Status: DC
Start: 2017-07-24 — End: 2018-04-03

## 2017-07-24 MED ORDER — ENOXAPARIN SODIUM 40 MG/0.4ML ~~LOC~~ SOLN
40.0000 mg | SUBCUTANEOUS | Status: DC
  Administered 2017-07-24: 40 mg via SUBCUTANEOUS
  Filled 2017-07-24: qty 0.4

## 2017-07-24 MED ORDER — POTASSIUM CHLORIDE CRYS ER 20 MEQ PO TBCR
40.0000 meq | EXTENDED_RELEASE_TABLET | Freq: Once | ORAL | Status: AC
  Administered 2017-07-24: 40 meq via ORAL
  Filled 2017-07-24: qty 2

## 2017-07-24 MED ORDER — ONDANSETRON HCL 4 MG/2ML IJ SOLN: 4.0000 mg | Freq: Four times a day (QID) | INTRAMUSCULAR | Status: DC | PRN

## 2017-07-24 MED ORDER — SIMVASTATIN 20 MG PO TABS: 20.0000 mg | ORAL_TABLET | Freq: Every day | ORAL | 3 refills | Status: DC

## 2017-07-24 MED ORDER — FUROSEMIDE 40 MG PO TABS: 40.0000 mg | ORAL_TABLET | Freq: Every day | ORAL | 0 refills | Status: DC | PRN

## 2017-07-24 MED ORDER — METOPROLOL SUCCINATE ER 50 MG PO TB24
50.0000 mg | ORAL_TABLET | Freq: Every day | ORAL | Status: DC
  Administered 2017-07-25: 50 mg via ORAL
  Filled 2017-07-24: qty 1

## 2017-07-24 MED ORDER — OMEGA-3-ACID ETHYL ESTERS 1 G PO CAPS
1.0000 g | ORAL_CAPSULE | Freq: Every day | ORAL | Status: DC
  Filled 2017-07-24 (×2): qty 1

## 2017-07-24 MED ORDER — BENAZEPRIL-HYDROCHLOROTHIAZIDE 20-12.5 MG PO TABS: 1.0000 | ORAL_TABLET | Freq: Two times a day (BID) | ORAL | 3 refills | Status: DC

## 2017-07-24 MED ORDER — ASPIRIN EC 81 MG PO TBEC
81.0000 mg | DELAYED_RELEASE_TABLET | Freq: Every day | ORAL | Status: DC
  Administered 2017-07-25: 81 mg via ORAL
  Filled 2017-07-24 (×2): qty 1

## 2017-07-24 MED ORDER — POTASSIUM CHLORIDE CRYS ER 20 MEQ PO TBCR: 20.0000 meq | EXTENDED_RELEASE_TABLET | ORAL | 1 refills | Status: DC | PRN

## 2017-07-24 MED ORDER — CEFTRIAXONE SODIUM IN DEXTROSE 20 MG/ML IV SOLN
1.0000 g | INTRAVENOUS | Status: DC
  Filled 2017-07-24: qty 50

## 2017-07-24 MED ORDER — AMLODIPINE BESYLATE 5 MG PO TABS: 5.0000 mg | ORAL_TABLET | Freq: Every day | ORAL | 3 refills | Status: DC

## 2017-07-24 MED ORDER — AMLODIPINE BESYLATE 5 MG PO TABS
5.0000 mg | ORAL_TABLET | Freq: Every day | ORAL | Status: DC
  Administered 2017-07-25: 5 mg via ORAL
  Filled 2017-07-24: qty 1

## 2017-07-24 NOTE — Progress Notes (Signed)
[name] Female    DOB: January 11, 1931   84 y.o.   MRN: [id number] Visit Date: 07/24/2017  Today's Provider: Vernie Murders, PA   Chief Complaint  Patient presents with  . Shortness of Breath   Subjective:    Shortness of Breath  This is a new problem. Episode onset: 2 weeks ago. The problem occurs intermittently. The problem has been gradually worsening. The symptoms are aggravated by any activity. Associated symptoms comments: Right shoulder pain. Her past medical history is significant for allergies, bronchiolitis, CAD, COPD, a heart failure and pneumonia.   Past Medical History:  Diagnosis Date  . Arthritis   . CHF (congestive heart failure) ([place])   . Depression   . Heart attack ([place]) 1994  . Hypertension    Past Surgical History:  Procedure Laterality Date  . CORONARY ARTERY BYPASS GRAFT  2001   No Known Allergies  Current Outpatient Medications:  .  albuterol (PROVENTIL) (2.5 MG/3ML) 0.083% nebulizer solution, Take 3 mLs (2.5 mg total) by nebulization every 6 (six) hours as needed for wheezing or shortness of breath., Disp: 150 mL, Rfl: 1 .  amLODipine (NORVASC) 5 MG tablet, Take 1 tablet (5 mg total) by mouth daily., Disp: 90 tablet, Rfl: 3 .  aspirin 81 MG tablet, Take 81 mg by mouth daily., Disp: , Rfl:  .  benazepril-hydrochlorthiazide (LOTENSIN HCT) 20-12.5 MG tablet, Take 1 tablet by mouth 2 (two) times daily., Disp: 180 tablet, Rfl: 3 .  budesonide-formoterol (SYMBICORT) 160-4.5 MCG/ACT inhaler, Inhale 2 puffs into the lungs 2 (two) times daily. (Patient taking differently: Inhale 2 puffs into the lungs 2 (two) times daily. ), Disp: 3 Inhaler, Rfl: 3 .  CALCIUM CARBONATE-VIT D-MIN PO, Take 1 tablet by mouth daily. , Disp: , Rfl:  .  fluticasone (FLONASE) 50 MCG/ACT nasal spray, Place 2 sprays into both nostrils daily. (Patient taking differently: Place 2 sprays into both nostrils daily. ), Disp: 16 g, Rfl: 6 .  furosemide (LASIX) 40 MG tablet,  Take 1 tablet (40 mg total) by mouth daily as needed. As needed, Disp: 30 tablet, Rfl: 0 .  L-Lysine 500 MG CAPS, Take by mouth. Reported on 10/15/2015, Disp: , Rfl:  .  Menthol-Methyl Salicylate (MUSCLE RUB EX), , Disp: , Rfl:  .  metoprolol succinate (TOPROL-XL) 50 MG 24 hr tablet, Take 1 tablet (50 mg total) by mouth daily. Take with or immediately following a meal., Disp: 90 tablet, Rfl: 3 .  Misc Natural Products (OSTEO BI-FLEX ADV JOINT SHIELD) TABS, Take by mouth., Disp: , Rfl:  .  Multiple Vitamin (MULTIVITAMIN) capsule, Take 1 capsule by mouth daily., Disp: , Rfl:  .  Omega-3 Fatty Acids (FISH OIL) 1000 MG CAPS, Take 1 capsule by mouth daily., Disp: , Rfl:  .  potassium chloride SA (K-DUR,KLOR-CON) 20 MEQ tablet, Take 1 tablet (20 mEq total) by mouth as needed., Disp: 30 tablet, Rfl: 1 .  simvastatin (ZOCOR) 20 MG tablet, Take 1 tablet (20 mg total) by mouth at bedtime., Disp: 90 tablet, Rfl: 3 .  Spacer/Aero-Holding Chambers (AEROCHAMBER MINI CHAMBER) DEVI, Use spacer with inhalers., Disp: 1 Device, Rfl: 0 .  VENTOLIN HFA 108 (90 Base) MCG/ACT inhaler, Inhale 2 puffs into the lungs every 6 (six) hours as needed. As needed, Disp: 3 Inhaler, Rfl: 4 .  vitamin E 100 UNIT capsule, Take 100 Units by mouth daily., Disp: , Rfl:   Current Facility-Administered Medications:  .  betamethasone acetate-betamethasone sodium phosphate (  CELESTONE) injection 3 mg, 3 mg, Intramuscular, Once, Edrick Kins, DPM  Review of Systems  Constitutional: Negative.   Respiratory: Positive for shortness of breath.   Cardiovascular: Negative.   Musculoskeletal:       Right shoulder pain     Social History   Tobacco Use  . Smoking status: Never Smoker  . Smokeless tobacco: Never Used  Substance Use Topics  . Alcohol use: No    Alcohol/week: 0.0 oz   Objective:   BP 128/68 (BP Location: Right Arm, Patient Position: Sitting, Cuff Size: Normal)   Pulse (!) 55   Temp (!) 97.5 F (36.4 C) (Oral)   Wt 164  lb 6.4 oz (74.6 kg)   SpO2 98%   BMI 32.11 kg/m    Physical Exam  Constitutional: She is oriented to person, place, and time. She appears well-developed and well-nourished.  HENT:  Head: Normocephalic.  Right Ear: External ear normal.  Left Ear: External ear normal.  Nose: Nose normal.  Mouth/Throat: Oropharynx is clear and moist.  Eyes: Conjunctivae are normal.  Neck: Neck supple.  Cardiovascular: Normal rate, regular rhythm and intact distal pulses.  Pulmonary/Chest: Effort normal and breath sounds normal. She has no wheezes. She has no rales.  Abdominal: Bowel sounds are normal.  Musculoskeletal:  Mild puffiness around ankles without pitting edema. Good pulse.  Lymphadenopathy:    She has no cervical adenopathy.  Neurological: She is alert and oriented to person, place, and time.  Psychiatric: She has a normal mood and affect. Her behavior is normal. Thought content normal.      Assessment & Plan:     1. Dyspnea on exertion Dyspnea worse with trying to check pulse oximetry in the office. Oxygen saturation dropped to 86% and shortly afterward developed pain in the neck and right shoulder area with significant dyspnea. EKG showed intra ventricular conduction defect with possible lateral ischemia. Has a history of chronic diastolic heart failure. Given 4 Baby ASA and sent to ER immediately (ER triage notified of impending arrival by family car with daughter). Has a history of 3 vessel CABG in 2001. Has not had much routine follow up with cardiologist (Dr. Ubaldo Glassing). - EKG 12-Lead - CBC with Differential/Platelet - Comprehensive metabolic panel - TSH - B Nat Peptide  2. Chronic systolic heart failure (HCC) Minimal puffy ankles without rales or wheezes. Initial pulse oximetry was 98%. Significant dyspnea on exertion. No JVD appreciated and no gallop.  - potassium chloride SA (K-DUR,KLOR-CON) 20 MEQ tablet; Take 1 tablet (20 mEq total) by mouth as needed.  Dispense: 30 tablet; Refill:  1 - furosemide (LASIX) 40 MG tablet; Take 1 tablet (40 mg total) by mouth daily as needed. As needed  Dispense: 30 tablet; Refill: 0 - EKG 12-Lead   3. Bronchitis with bronchospasm History of COPD. Does not use Symbicort regularly. Will send refills of Symbicort and Ventolin to the pharmacy for home use after ER evaluation. - VENTOLIN HFA 108 (90 Base) MCG/ACT inhaler; Inhale 2 puffs into the lungs every 6 (six) hours as needed. As needed  Dispense: 3 Inhaler; Refill: 4 - budesonide-formoterol (SYMBICORT) 160-4.5 MCG/ACT inhaler; Inhale 2 puffs into the lungs 2 (two) times daily.  Dispense: 3 Inhaler; Refill: 3  4. Hypercholesteremia Tolerating statin. Recommend low fat DASH diet and refilled medications. - simvastatin (ZOCOR) 20 MG tablet; Take 1 tablet (20 mg total) by mouth at bedtime.  Dispense: 90 tablet; Refill: 3   5. Benign hypertension Well controlled. Will recheck labs and  refill medications. Sent to ER with dyspnea and shoulder/neck pain to rule out CHF with impending MI. - benazepril-hydrochlorthiazide (LOTENSIN HCT) 20-12.5 MG tablet; Take 1 tablet by mouth 2 (two) times daily.  Dispense: 180 tablet; Refill: 3 - amLODipine (NORVASC) 5 MG tablet; Take 1 tablet (5 mg total) by mouth daily.  Dispense: 90 tablet; Refill: 3 - metoprolol succinate (TOPROL-XL) 50 MG 24 hr tablet; Take 1 tablet (50 mg total) by mouth daily. Take with or immediately following a meal.  Dispense: 90 tablet; Refill: [place]

## 2017-07-24 NOTE — H&P (Addendum)
[facility] [place] NAME: Amanda Strickland    MR#:  [id number]  DATE OF BIRTH:  14-Mar-1931  DATE OF ADMISSION:  07/24/2017  PRIMARY CARE PHYSICIAN: Mar Daring, PA-C   REQUESTING/REFERRING PHYSICIAN: Dr. Merlyn Lot  CHIEF COMPLAINT:   Chief Complaint  Patient presents with  . Shortness of Breath    HISTORY OF PRESENT ILLNESS:  Amanda Strickland  is a 84 y.o. female with a known history of CAD status post CABG, hypertension, depression, history of diastolic heart failure who is very independent at baseline presents to hospital secondary to 2 week history of worsening cough, shortness of breath and right-sided chest pain. Patient's last hospital admission was for diastolic heart failure exacerbation about 3 years ago. She takes as needed Lasix at home and measures her weight every day. She has been doing well up until 2 weeks ago. Started with dry cough and shortness of breath which has been gradually worsening. Over the last week she's also had nausea, low-grade temperatures at home and right-sided chest pain radiating to right shoulder and right upper back. She went to see her PCP today with these symptoms and was noted to be hypoxic on room air into the 80s and so sent to the emergency room. Chest x-ray here reveals a right lower lobe pneumonia and also mild congestive heart failure exacerbation.  PAST MEDICAL HISTORY:   Past Medical History:  Diagnosis Date  . Arthritis   . CAD (coronary artery disease)   . CHF (congestive heart failure) (HCC)    diastolic heart failure  . Depression   . Heart attack ([place]) 1994  . Hypertension     PAST SURGICAL HISTORY:   Past Surgical History:  Procedure Laterality Date  . CORONARY ARTERY BYPASS GRAFT  2001    SOCIAL HISTORY:   Social History   Tobacco Use  . Smoking status: Never Smoker  . Smokeless tobacco: Never Used  Substance Use Topics  . Alcohol use: No   Alcohol/week: 0.0 oz    FAMILY HISTORY:   Family History  Problem Relation Age of Onset  . Heart attack Father   . Breast cancer Maternal Aunt     DRUG ALLERGIES:  No Known Allergies  REVIEW OF SYSTEMS:   Review of Systems  Constitutional: Positive for malaise/fatigue. Negative for chills, fever and weight loss.  HENT: Negative for ear discharge, ear pain, hearing loss, nosebleeds and tinnitus.   Eyes: Negative for blurred vision, double vision and photophobia.  Respiratory: Positive for cough and shortness of breath. Negative for hemoptysis and wheezing.   Cardiovascular: Positive for chest pain. Negative for palpitations, orthopnea and leg swelling.  Gastrointestinal: Positive for nausea. Negative for abdominal pain, constipation, diarrhea, heartburn, melena and vomiting.  Genitourinary: Negative for dysuria, frequency, hematuria and urgency.  Musculoskeletal: Negative for back pain, myalgias and neck pain.  Skin: Negative for rash.  Neurological: Negative for dizziness, tingling, tremors, sensory change, speech change, focal weakness and headaches.  Endo/Heme/Allergies: Does not bruise/bleed easily.  Psychiatric/Behavioral: Negative for depression.    MEDICATIONS AT HOME:   Prior to Admission medications   Medication Sig Start Date End Date Taking? Authorizing Provider  amLODipine (NORVASC) 5 MG tablet Take 1 tablet (5 mg total) by mouth daily. 07/24/17  Yes Chrismon, Vickki Muff, PA  aspirin 81 MG tablet Take 81 mg by mouth daily.   Yes [provider]  benazepril-hydrochlorthiazide (LOTENSIN HCT) 20-12.5 MG tablet Take 1 tablet by mouth  2 (two) times daily. 07/24/17  Yes Chrismon, Vickki Muff, PA  budesonide-formoterol (SYMBICORT) 160-4.5 MCG/ACT inhaler Inhale 2 puffs into the lungs 2 (two) times daily. 07/24/17  Yes Chrismon, Vickki Muff, PA  CALCIUM CARBONATE-VIT D-MIN PO Take 1 tablet by mouth daily.    Yes [provider]  fluticasone (FLONASE) 50 MCG/ACT nasal  spray Place 2 sprays into both nostrils daily. Patient taking differently: Place 2 sprays into both nostrils daily.  08/01/16  Yes Mar Daring, PA-C  furosemide (LASIX) 40 MG tablet Take 1 tablet (40 mg total) by mouth daily as needed. As needed 07/24/17  Yes Chrismon, Simona Huh E, PA  L-Lysine 500 MG CAPS Take by mouth. Reported on 10/15/2015   Yes [provider]  Menthol-Methyl Salicylate (MUSCLE RUB EX) Apply 1 application topically daily as needed.  06/22/15  Yes [provider]  metoprolol succinate (TOPROL-XL) 50 MG 24 hr tablet Take 1 tablet (50 mg total) by mouth daily. Take with or immediately following a meal. 07/24/17  Yes Chrismon, Vickki Muff, PA  Misc Natural Products (OSTEO BI-FLEX ADV JOINT SHIELD) TABS Take by mouth.   Yes [provider]  Multiple Vitamin (MULTIVITAMIN) capsule Take 1 capsule by mouth daily.   Yes [provider]  Omega-3 Fatty Acids (FISH OIL) 1000 MG CAPS Take 1 capsule by mouth daily.   Yes [provider]  potassium chloride SA (K-DUR,KLOR-CON) 20 MEQ tablet Take 1 tablet (20 mEq total) by mouth as needed. 07/24/17  Yes Chrismon, Vickki Muff, PA  simvastatin (ZOCOR) 20 MG tablet Take 1 tablet (20 mg total) by mouth at bedtime. 07/24/17  Yes Chrismon, Vickki Muff, PA  Spacer/Aero-Holding Chambers (AEROCHAMBER MINI CHAMBER) DEVI Use spacer with inhalers. 08/01/16  Yes Burnette, Clearnce Sorrel, PA-C  VENTOLIN HFA 108 (90 Base) MCG/ACT inhaler Inhale 2 puffs into the lungs every 6 (six) hours as needed. As needed 07/24/17  Yes Chrismon, Vickki Muff, PA  vitamin E 100 UNIT capsule Take 100 Units by mouth daily.   Yes [provider]      VITAL SIGNS:  Blood pressure 134/68, pulse 66, temperature (!) 97.4 F (36.3 C), temperature source Oral, resp. rate 16, weight 74.4 kg (164 lb), SpO2 96 %.  PHYSICAL EXAMINATION:   Physical Exam  GENERAL:  84 y.o.-year-old patient lying in the bed with no acute distress.  EYES: Pupils  equal, round, reactive to light and accommodation. No scleral icterus. Extraocular muscles intact.  HEENT: Head atraumatic, normocephalic. Oropharynx and nasopharynx clear.  NECK:  Supple, no jugular venous distention. No thyroid enlargement, no tenderness.  LUNGS: Bibasilar crackles heard with some rhonchi at right base. No wheezing. Moving air bilaterally.. No use of accessory muscles of respiration.  CARDIOVASCULAR: S1, S2 normal. No rubs, or gallops. 2/6 systolic murmur is present ABDOMEN: Soft, nontender, nondistended. Bowel sounds present. No organomegaly or mass.  EXTREMITIES: No pedal edema, cyanosis, or clubbing.  NEUROLOGIC: Cranial nerves II through XII are intact. Muscle strength 5/5 in all extremities. Sensation intact. Gait not checked.  PSYCHIATRIC: The patient is alert and oriented x 3.  SKIN: No obvious rash, lesion, or ulcer.   LABORATORY PANEL:   CBC Recent Labs  Lab 07/24/17 1235  WBC 10.0  HGB 15.4  HCT 45.3  PLT 183   ------------------------------------------------------------------------------------------------------------------  Chemistries  Recent Labs  Lab 07/24/17 1235  NA 140  K 3.4*  CL 102  CO2 26  GLUCOSE 124*  BUN 14  CREATININE 0.86  CALCIUM 10.0   ------------------------------------------------------------------------------------------------------------------  Cardiac Enzymes Recent Labs  Lab 07/24/17 1235  TROPONINI <0.03   ------------------------------------------------------------------------------------------------------------------  RADIOLOGY:  Dg Chest 2 View  Result Date: 07/24/2017 CLINICAL DATA:  Shortness of breath and decreased oxygen saturation EXAM: CHEST  2 VIEW COMPARISON:  June 22, 2015 FINDINGS: Patient is status post coronary artery bypass grafting. Heart size is upper normal. Pulmonary vascularity is normal. There is consolidation in the right base with small right pleural effusion. There is underlying mild  interstitial edema. There is aortic atherosclerosis. No adenopathy. No bone lesions. IMPRESSION: Consolidation right base with small right pleural effusion. Suspect pneumonia. There is mild interstitial prominence which may represent mild interstitial edema. Heart upper normal in size. There is aortic atherosclerosis. Aortic Atherosclerosis (ICD10-I70.0). Electronically Signed   By: Lowella Grip III M.D.   On: 07/24/2017 13:32    EKG:   Orders placed or performed during the hospital encounter of 07/24/17  . ED EKG within 10 minutes  . ED EKG within 10 minutes  . EKG 12-Lead  . EKG 12-Lead    IMPRESSION AND PLAN:   Amanda Strickland  is a 84 y.o. female with a known history of CAD status post CABG, hypertension, depression, history of diastolic heart failure who is very independent at baseline presents to hospital secondary to 2 week history of worsening cough, shortness of breath and right-sided chest pain.  1. Acute hypoxic respiratory failure-secondary to right lower lobe pneumonia and also mild diastolic CHF exacerbation. -Community-acquired pneumonia, follow-up blood cultures -Continue azithromycin and Rocephin -Currently requiring 3-4 L oxygen which is acute, wean as tolerated. -Cough medications as needed.  2. Acute on chronic diastolic CHF exacerbation-last echocardiogram was in 2015, patient has been doing fine using as needed Lasix at home. -Daily weights, strict input and output monitoring -IV Lasix daily with potassium supplements.  3.hypertension-continue Norvasc, benazepril and hydrochlorothiazide and Toprol.  4. CAD status post CABG-stable at this time. Follow-up echocardiogram. Continue cardiac medications.  5. DVT prophylaxis-Lovenox  Physical therapy consult for weakness.    All the records are reviewed and case discussed with ED provider. Management plans discussed with the patient, family and they are in agreement.  CODE STATUS: Full code  TOTAL TIME  TAKING CARE OF THIS PATIENT: 50 minutes.    Gladstone Lighter M.D on 07/24/2017 at 2:52 PM  Between 7am to 6pm - Pager - [phone]  After 6pm go to [email or web address] - password [email or web address] [facility]  Office  [phone]  CC: Primary care physician; Mar Daring, PA-C

## 2017-07-24 NOTE — ED Triage Notes (Signed)
Pt to ed sent over from Dr Durenda Age office for further eval of shortness of breath and low oxygen when ambulatory, also states abnormal EKG. Pt was given four baby aspirin over at the office.

## 2017-07-24 NOTE — ED Notes (Signed)
Patient transported to X-ray 

## 2017-07-24 NOTE — ED Notes (Signed)
This RN notified Lab of BNP order needed to be add on. Lab tech [facility] states they will run the lab now.

## 2017-07-24 NOTE — ED Notes (Signed)
Bcx2 Lac, and flu sent at this time.

## 2017-07-24 NOTE — ED Triage Notes (Signed)
Sent to ED for evaluation du to sats dropping to 86 with ambulation, increased dyspnea, EKG changes.  4 Baby ASA given in office.  Patient currently c/o right shoulder pain.

## 2017-07-24 NOTE — Progress Notes (Signed)
Pharmacy Antibiotic Note  BHAVYA ESCHETE is a 84 y.o. female admitted on 07/24/2017 with SOB.  Pharmacy has been consulted for azithromycin and ceftriaxone dosing.  Plan: Azithromycin 500 mg iv q 24 hours and CTX 1 g iv q 24 hours.   Weight: 164 lb (74.4 kg)  Temp (24hrs), Avg:97.5 F (36.4 C), Min:97.4 F (36.3 C), Max:97.5 F (36.4 C)  Recent Labs  Lab 07/24/17 1235 07/24/17 1346  WBC 10.0  --   CREATININE 0.86  --   LATICACIDVEN  --  1.3    CrCl cannot be calculated (Unknown ideal weight.).    No Known Allergies  Antimicrobials this admission: CTX 1/14 >>  azithromycin 1/14 >>   Dose adjustments this admission:   Microbiology results: 1/14 BCx: sent  Thank you for allowing pharmacy to be a part of this patient's care.  Ulice Dash D 07/24/2017 3:25 PM

## 2017-07-24 NOTE — ED Notes (Signed)
Pt presents today from Dr. Durenda Age office. Pt was sent her of  Increased dyspnea abd EKG, pt received 324 mg of Asprin at PCP office. Pt on monitor awaiting EDp

## 2017-07-24 NOTE — ED Provider Notes (Signed)
[facility] Emergency Department Provider Note    First MD Initiated Contact with Patient 07/24/17 1247     (approximate)  I have reviewed the triage vital signs and the nursing notes.   HISTORY  Chief Complaint Shortness of Breath    HPI Amanda Strickland is a 84 y.o. female with a history of congestive heart failure status post CABG as well as history of COPD not on any home oxygen presents with 2 weeks of worsening shortness of breath particularly with exertion.  Denies any chest pain but has been complaining of right-sided shoulder pain particularly with ambulation.  Denies any fevers.  No cough.  No orthopnea.  No lower extremity swelling.  Denies any chest pain at this moment.  Was seen at her PCPs office this morning and when ambulating her O2 saturations dropped to 86% associated with significant shortness of breath.  They did improve with rest.  She has not been on any antibiotics.  Her immunizations are up-to-date.  Past Medical History:  Diagnosis Date  . Arthritis   . CHF (congestive heart failure) ([place])   . Depression   . Heart attack ([place]) 1994  . Hypertension    Family History  Problem Relation Age of Onset  . Heart attack Father   . Breast cancer Maternal Aunt    Past Surgical History:  Procedure Laterality Date  . CORONARY ARTERY BYPASS GRAFT  2001   Patient Active Problem List   Diagnosis Date Noted  . COPD (chronic obstructive pulmonary disease) ([place]) 10/15/2015  . Chronic kidney disease (CKD), stage III (moderate) ([place]) 05/25/2015  . Congestive heart failure due to high blood pressure ([place]) 05/25/2015  . Fasciculation 05/25/2015  . Blood glucose elevated 05/25/2015  . OP (osteoporosis) 05/25/2015  . Leg varices 05/25/2015  . Chronic diastolic heart failure ([place]) 11/19/2014  . HTN (hypertension) 11/19/2014  . Dizziness 11/19/2014  . CAD (coronary artery disease) of bypass graft 01/29/2014  . History of colon polyps  02/18/2010  . Arteriosclerosis of coronary artery 03/30/2009  . Hypercholesteremia 03/30/2009      Prior to Admission medications   Medication Sig Start Date End Date Taking? Authorizing Provider  Spacer/Aero-Holding Chambers (AEROCHAMBER MINI CHAMBER) DEVI Use spacer with inhalers. 08/01/16  Yes Mar Daring, PA-C  amLODipine (NORVASC) 5 MG tablet Take 1 tablet (5 mg total) by mouth daily. 07/24/17   Chrismon, Vickki Muff, PA  aspirin 81 MG tablet Take 81 mg by mouth daily.    [provider]  benazepril-hydrochlorthiazide (LOTENSIN HCT) 20-12.5 MG tablet Take 1 tablet by mouth 2 (two) times daily. 07/24/17   Chrismon, Vickki Muff, PA  budesonide-formoterol (SYMBICORT) 160-4.5 MCG/ACT inhaler Inhale 2 puffs into the lungs 2 (two) times daily. 07/24/17   Chrismon, Vickki Muff, PA  CALCIUM CARBONATE-VIT D-MIN PO Take 1 tablet by mouth daily.     [provider]  fluticasone (FLONASE) 50 MCG/ACT nasal spray Place 2 sprays into both nostrils daily. Patient taking differently: Place 2 sprays into both nostrils daily.  08/01/16   Mar Daring, PA-C  furosemide (LASIX) 40 MG tablet Take 1 tablet (40 mg total) by mouth daily as needed. As needed 07/24/17   Chrismon, Simona Huh E, PA  L-Lysine 500 MG CAPS Take by mouth. Reported on 10/15/2015    [provider]  Menthol-Methyl Salicylate (MUSCLE RUB EX)  06/22/15   [provider]  metoprolol succinate (TOPROL-XL) 50 MG 24 hr tablet Take 1 tablet (50 mg total) by  mouth daily. Take with or immediately following a meal. 07/24/17   Chrismon, Vickki Muff, PA  Misc Natural Products (OSTEO BI-FLEX ADV JOINT SHIELD) TABS Take by mouth.    [provider]  Multiple Vitamin (MULTIVITAMIN) capsule Take 1 capsule by mouth daily.    [provider]  Omega-3 Fatty Acids (FISH OIL) 1000 MG CAPS Take 1 capsule by mouth daily.    [provider]  potassium chloride SA (K-DUR,KLOR-CON) 20 MEQ tablet Take 1 tablet  (20 mEq total) by mouth as needed. 07/24/17   Chrismon, Vickki Muff, PA  simvastatin (ZOCOR) 20 MG tablet Take 1 tablet (20 mg total) by mouth at bedtime. 07/24/17   Chrismon, Vickki Muff, PA  VENTOLIN HFA 108 (90 Base) MCG/ACT inhaler Inhale 2 puffs into the lungs every 6 (six) hours as needed. As needed 07/24/17   Chrismon, Vickki Muff, PA  vitamin E 100 UNIT capsule Take 100 Units by mouth daily.    [provider]    Allergies Patient has no known allergies.    Social History Social History   Tobacco Use  . Smoking status: Never Smoker  . Smokeless tobacco: Never Used  Substance Use Topics  . Alcohol use: No    Alcohol/week: 0.0 oz  . Drug use: No    Review of Systems Patient denies headaches, rhinorrhea, blurry vision, numbness, shortness of breath, chest pain, edema, cough, abdominal pain, nausea, vomiting, diarrhea, dysuria, fevers, rashes or hallucinations unless otherwise stated above in HPI. ____________________________________________   PHYSICAL EXAM:  VITAL SIGNS: Vitals:   07/24/17 1233 07/24/17 1259  BP: 134/68   Pulse: 66   Resp: 16   Temp: (!) 97.4 F (36.3 C)   SpO2: 92% 92%    Constitutional: Alert and orientedEyes: Conjunctivae are normal.  Head: Atraumatic. Nose: No congestion/rhinnorhea. Mouth/Throat: Mucous membranes are moist.   Neck: No stridor. Painless ROM.  Cardiovascular: Normal rate, regular rhythm. Grossly normal heart sounds.  Good peripheral circulation. Respiratory: Normal respiratory effort.  No retractions. Lungs crackles in bilateral posterior lung fields Gastrointestinal: Soft and nontender. No distention. No abdominal bruits. No CVA tenderness. Genitourinary:  Musculoskeletal: No lower extremity tenderness nor edema.  No joint effusions. Neurologic:  Normal speech and language. No gross focal neurologic deficits are appreciated. No facial droop Skin:  Skin is warm, dry and intact. No rash noted. Psychiatric: Mood and affect are  normal. Speech and behavior are normal.  ____________________________________________   LABS (all labs ordered are listed, but only abnormal results are displayed)  Results for orders placed or performed during the hospital encounter of 07/24/17 (from the past 24 hour(s))  Basic metabolic panel     Status: Abnormal   Collection Time: 07/24/17 12:35 PM  Result Value Ref Range   Sodium 140 135 - 145 mmol/L   Potassium 3.4 (L) 3.5 - 5.1 mmol/L   Chloride 102 101 - 111 mmol/L   CO2 26 22 - 32 mmol/L   Glucose, Bld 124 (H) 65 - 99 mg/dL   BUN 14 6 - 20 mg/dL   Creatinine, Ser 0.86 0.44 - 1.00 mg/dL   Calcium 10.0 8.9 - 10.3 mg/dL   GFR calc non Af Amer >60 >60 mL/min   GFR calc Af Amer >60 >60 mL/min   Anion gap 12 5 - 15  CBC     Status: None   Collection Time: 07/24/17 12:35 PM  Result Value Ref Range   WBC 10.0 3.6 - 11.0 K/uL   RBC 4.86 3.80 -  5.20 MIL/uL   Hemoglobin 15.4 12.0 - 16.0 g/dL   HCT 45.3 35.0 - 47.0 %   MCV 93.0 80.0 - 100.0 fL   MCH 31.6 26.0 - 34.0 pg   MCHC 33.9 32.0 - 36.0 g/dL   RDW 13.2 11.5 - 14.5 %   Platelets 183 150 - 440 K/uL  Troponin I     Status: None   Collection Time: 07/24/17 12:35 PM  Result Value Ref Range   Troponin I <0.03 <0.03 ng/mL  Brain natriuretic peptide     Status: Abnormal   Collection Time: 07/24/17 12:35 PM  Result Value Ref Range   B Natriuretic Peptide 531.0 (H) 0.0 - 100.0 pg/mL   ____________________________________________  EKG My review and personal interpretation at Time: 12:53   Indication: chest pain  Rate: 70  Rhythm: sinus Axis: left Other: lbbb, no significant changes compared to previous, occasional pvc ____________________________________________  RADIOLOGY  I personally reviewed all radiographic images ordered to evaluate for the above acute complaints and reviewed radiology reports and findings.  These findings were personally discussed with the patient.  Please see medical record for radiology  report.  ____________________________________________   PROCEDURES  Procedure(s) performed:  .Critical Care Performed by: Merlyn Lot, MD Authorized by: Merlyn Lot, MD   Critical care provider statement:    Critical care time (minutes):  30   Critical care time was exclusive of:  Separately billable procedures and treating other patients   Critical care was necessary to treat or prevent imminent or life-threatening deterioration of the following conditions:  Respiratory failure   Critical care was time spent personally by me on the following activities:  Development of treatment plan with patient or surrogate, discussions with consultants, evaluation of patient's response to treatment, examination of patient, obtaining history from patient or surrogate, ordering and performing treatments and interventions, ordering and review of laboratory studies, ordering and review of radiographic studies, pulse oximetry, re-evaluation of patient's condition and review of old charts      Critical Care performed: yes  ____________________________________________   INITIAL IMPRESSION / [place] / ED COURSE  Pertinent labs & imaging results that were available during my care of the patient were reviewed by me and considered in my medical decision making (see chart for details).  DDX: Asthma, copd, CHF, pna, ptx, malignancy, Pe, anemia   Amanda Strickland is a 84 y.o. who presents to the ED with his of breath as described above.  Patient with acute hypoxic respiratory failure improved with supplemental oxygen.  Presentation concerning for pneumonia versus CHF.  Chest x-ray does show consolidation with right-sided pleural effusion.  She does not have any fever or white count but x-ray appearance is concerning for pneumonia.  Patient also with 4 kg weight gain which would suggest some component of congestive heart failure for which she will get IV Lasix.  Patient was also given  nebulizers for her underlying COPD.  Will treat with broad-spectrum antibiotics for pneumonia.  Given her comorbidities and new hypoxia patient will require admission to the hospital for further management.  Have discussed with the patient and available family all diagnostics and treatments performed thus far and all questions were answered to the best of my ability. The patient demonstrates understanding and agreement with plan.       ____________________________________________   FINAL CLINICAL IMPRESSION(S) / ED DIAGNOSES  Final diagnoses:  Acute respiratory failure with hypoxia ([place])  Community acquired pneumonia of right lower lobe of lung ([place])  Congestive heart failure, unspecified HF chronicity, unspecified heart failure type ([place])      NEW MEDICATIONS STARTED DURING THIS VISIT:  New Prescriptions   No medications on file     Note:  This document was prepared using Dragon voice recognition software and may include unintentional dictation errors.    Merlyn Lot, MD 07/24/17 1349

## 2017-07-24 NOTE — ED Notes (Signed)
ED EKG done at this time.

## 2017-07-25 LAB — BASIC METABOLIC PANEL
Anion gap: 12 (ref 5–15)
BUN: 20 mg/dL (ref 6–20)
CO2: 26 mmol/L (ref 22–32)
CREATININE: 0.95 mg/dL (ref 0.44–1.00)
Calcium: 9 mg/dL (ref 8.9–10.3)
Chloride: 104 mmol/L (ref 101–111)
GFR calc Af Amer: >60 mL/min (ref >60)
GFR, EST NON AFRICAN AMERICAN: 53 mL/min — AB (ref >60)
Glucose, Bld: 118 mg/dL — ABNORMAL HIGH (ref 65–99)
Potassium: 4.1 mmol/L (ref 3.5–5.1)
SODIUM: 142 mmol/L (ref 135–145)

## 2017-07-25 LAB — CBC
HCT: 41.1 % (ref 35.0–47.0)
Hemoglobin: 14.3 g/dL (ref 12.0–16.0)
MCH: 32.4 pg (ref 26.0–34.0)
MCHC: 34.7 g/dL (ref 32.0–36.0)
MCV: 93.2 fL (ref 80.0–100.0)
PLATELETS: 156 10*3/uL (ref 150–440)
RBC: 4.41 MIL/uL (ref 3.80–5.20)
RDW: 13 % (ref 11.5–14.5)
WBC: 7.6 10*3/uL (ref 3.6–11.0)

## 2017-07-25 LAB — ECHOCARDIOGRAM COMPLETE
Height: 65 in
WEIGHTICAEL: 2457.6 [oz_av]

## 2017-07-25 LAB — TROPONIN I: Troponin I: 0.03 ng/mL (ref <0.03)

## 2017-07-25 MED ORDER — DEXTROSE 5 % IV SOLN
1.0000 g | INTRAVENOUS | Status: DC
  Filled 2017-07-25: qty 10

## 2017-07-25 MED ORDER — MOXIFLOXACIN HCL 400 MG PO TABS: 400.0000 mg | ORAL_TABLET | Freq: Every day | ORAL | 0 refills | Status: AC

## 2017-07-25 MED ORDER — FUROSEMIDE 20 MG PO TABS: 20.0000 mg | ORAL_TABLET | Freq: Every day | ORAL | 0 refills | Status: DC

## 2017-07-25 NOTE — Progress Notes (Signed)
  Discharged to home with her daughter and a friend.  Feeling well.  O2 sats on room air were greater than 95%.

## 2017-07-25 NOTE — Plan of Care (Signed)
Continues to diurese.  No distress noted with ambulation in room.

## 2017-07-25 NOTE — Progress Notes (Signed)
PT Cancellation Note  Patient Details Name: Amanda Strickland MRN: [id number] DOB: 02-Jun-1931   Cancelled Treatment:    Reason Eval/Treat Not Completed: Other (comment).  PT consult received.  Chart reviewed.  PT educated pt on purpose of PT consult but pt reporting she does not need PT (pt declining PT); visitor in pt's room supporting pt's statements (nursing also supporting pt's statements and reporting walking pt around nursing station earlier today).  Will discontinue PT order.  Please re-consult PT if pt's status changes and acute PT needs are identified.  Leitha Bleak, PT 07/25/17, 1:49 PM [phone]

## 2017-07-25 NOTE — Progress Notes (Signed)
RN stated she reviewed "Living Better with Heart Failure" book with pt

## 2017-07-25 NOTE — Care Management Note (Signed)
Case Management Note  Patient Details  Name: JACQELINE BROERS MRN: [id number] Date of Birth: [birth year]  Subjective/Objective:                 Oxygen is acute. Independent in all adls, denies issues accessing medical care, obtaining medications or with transportation.  Current with her PCP.  Has participated previously in cardiac rehab after her cabg. Has access to scales. Current oxygen is acute. Patient and her daughter decline need for home health nurse.  Daughter Karena Addison is a Marine scientist.     Action/Plan:  Home oxygen assessment needed prior to discharge  Expected Discharge Date:                  Expected Discharge Plan:     In-House Referral:     Discharge planning Services     Post Acute Care Choice:    Choice offered to:     DME Arranged:    DME Agency:     HH Arranged:    HH Agency:     Status of Service:     If discussed at [employer], dates discussed:    Additional Comments:  Katrina Stack, RN 07/25/2017, 9:11 AM

## 2017-07-25 NOTE — Progress Notes (Signed)
SATURATION QUALIFICATIONS: (This note is used to comply with regulatory documentation for home oxygen)  Patient Saturations on Room Air at Rest = 91 %  Patient Saturations on Room Air while Ambulating = 90 - 95%  Patient Saturations on 1 Liters of oxygen while Ambulating =95 %  Please briefly explain why patient needs home oxygen:

## 2017-07-25 NOTE — Discharge Instructions (Signed)
Heart Failure Clinic appointment on July 31 2016 at 11:40am with Darylene Price, [place]. Please call [phone] to reschedule.

## 2017-07-25 NOTE — Discharge Summary (Signed)
[facility] [place] NAME: Emanuela Runnion    MR#:  [id number]  DATE OF BIRTH:  October 12, 1930  DATE OF ADMISSION:  07/24/2017 ADMITTING PHYSICIAN: Gladstone Lighter, MD  DATE OF DISCHARGE: No discharge date for patient encounter.  PRIMARY CARE PHYSICIAN: Mar Daring, PA-C    ADMISSION DIAGNOSIS:  Acute respiratory failure with hypoxia ([place]) [J96.01] Community acquired pneumonia of right lower lobe of lung ([place]) [J18.1] Congestive heart failure, unspecified HF chronicity, unspecified heart failure type ([place]) [I50.9]  DISCHARGE DIAGNOSIS:  Active Problems:   CHF exacerbation ([place])   SECONDARY DIAGNOSIS:   Past Medical History:  Diagnosis Date  . Arthritis   . CAD (coronary artery disease)   . CHF (congestive heart failure) ([facility])    diastolic heart failure  . Depression   . Heart attack ([place]) 1994  . Hypertension     HOSPITAL COURSE:  Declynn Lopresti  is a 84 y.o. female with a known history of CAD status post CABG, hypertension, depression, history of diastolic heart failure who is very independent at baseline presents to hospital secondary to 2 week history of worsening cough, shortness of breath and right-sided chest pain.  1. Acute hypoxic respiratory failure-secondary to right lower lobe pneumonia and also mild diastolic CHF exacerbation. Resolved Secondary to CAP and congestive heart failure exacerbation Treated with empiric azithromycin/Rocephin while in house, successfully weaned off oxygen, and patient did well  2. Acute on chronic diastolic CHF exacerbation-last echocardiogram was in 2015, patient has been doing fine using as needed Lasix at home. Resolved Treated our congestive heart failure protocol, provided IV Lasix with potassium replacement, and patient did well Follow-up with Dr. Faith/cardiology 1 week status post discharge for continued care/reevaluation Echocardiogram noted for stage I diastolic  dysfunction, left ventricular ejection fraction 45-50%  3. Chronic benign essential hypertension Controlled on Norvasc, benazepril, hydrochlorothiazide, and Toprol.  4. CAD s/p CABG Stable on current regiment  5.  Acute community-acquired pneumonia Resolving Plan of care as stated above  DISCHARGE CONDITIONS:  On day of discharge patient is afebrile, stable, tolerating diet, annually with difficulty, successfully weaned off oxygen, follow-up with cardiology in 1 week for reevaluation, follow-up primary care provider in 1-3 days for reevaluation of all other chronic maladies, for more specific details please see chart  CONSULTS OBTAINED:    DRUG ALLERGIES:  No Known Allergies  DISCHARGE MEDICATIONS:   Allergies as of 07/25/2017   No Known Allergies     Medication List    TAKE these medications   AEROCHAMBER MINI CHAMBER Devi Use spacer with inhalers.   amLODipine 5 MG tablet Commonly known as:  NORVASC Take 1 tablet (5 mg total) by mouth daily.   aspirin 81 MG tablet Take 81 mg by mouth daily.   benazepril-hydrochlorthiazide 20-12.5 MG tablet Commonly known as:  LOTENSIN HCT Take 1 tablet by mouth 2 (two) times daily.   budesonide-formoterol 160-4.5 MCG/ACT inhaler Commonly known as:  SYMBICORT Inhale 2 puffs into the lungs 2 (two) times daily.   CALCIUM CARBONATE-VIT D-MIN PO Take 1 tablet by mouth daily.   Fish Oil 1000 MG Caps Take 1 capsule by mouth daily.   fluticasone 50 MCG/ACT nasal spray Commonly known as:  FLONASE Place 2 sprays into both nostrils daily.   furosemide 20 MG tablet Commonly known as:  LASIX Take 1 tablet (20 mg total) by mouth daily. What changed:    medication strength  how much to take  when to take  this  reasons to take this  additional instructions   L-Lysine 500 MG Caps Take by mouth. Reported on 10/15/2015   metoprolol succinate 50 MG 24 hr tablet Commonly known as:  TOPROL-XL Take 1 tablet (50 mg total) by  mouth daily. Take with or immediately following a meal.   moxifloxacin 400 MG tablet Commonly known as:  AVELOX Take 1 tablet (400 mg total) by mouth daily for 5 days.   multivitamin capsule Take 1 capsule by mouth daily.   MUSCLE RUB EX Apply 1 application topically daily as needed.   OSTEO BI-FLEX ADV JOINT SHIELD Tabs Take by mouth.   potassium chloride SA 20 MEQ tablet Commonly known as:  K-DUR,KLOR-CON Take 1 tablet (20 mEq total) by mouth as needed.   simvastatin 20 MG tablet Commonly known as:  ZOCOR Take 1 tablet (20 mg total) by mouth at bedtime.   VENTOLIN HFA 108 (90 Base) MCG/ACT inhaler Generic drug:  albuterol Inhale 2 puffs into the lungs every 6 (six) hours as needed. As needed   vitamin E 100 UNIT capsule Take 100 Units by mouth daily.        DISCHARGE INSTRUCTIONS:    If you experience worsening of your admission symptoms, develop shortness of breath, life threatening emergency, suicidal or homicidal thoughts you must seek medical attention immediately by calling 911 or calling your MD immediately  if symptoms less severe.  You Must read complete instructions/literature along with all the possible adverse reactions/side effects for all the Medicines you take and that have been prescribed to you. Take any new Medicines after you have completely understood and accept all the possible adverse reactions/side effects.   Please note  You were cared for by a hospitalist during your hospital stay. If you have any questions about your discharge medications or the care you received while you were in the hospital after you are discharged, you can call the unit and asked to speak with the hospitalist on call if the hospitalist that took care of you is not available. Once you are discharged, your primary care physician will handle any further medical issues. Please note that NO REFILLS for any discharge medications will be authorized once you are discharged, as it is  imperative that you return to your primary care physician (or establish a relationship with a primary care physician if you do not have one) for your aftercare needs so that they can reassess your need for medications and monitor your lab values.    Today   CHIEF COMPLAINT:   Chief Complaint  Patient presents with  . Shortness of Breath    HISTORY OF PRESENT ILLNESS:  84 y.o. female with a known history of CAD status post CABG, hypertension, depression, history of diastolic heart failure who is very independent at baseline presents to hospital secondary to 2 week history of worsening cough, shortness of breath and right-sided chest pain. Patient's last hospital admission was for diastolic heart failure exacerbation about 3 years ago. She takes as needed Lasix at home and measures her weight every day. She has been doing well up until 2 weeks ago. Started with dry cough and shortness of breath which has been gradually worsening. Over the last week she's also had nausea, low-grade temperatures at home and right-sided chest pain radiating to right shoulder and right upper back. She went to see her PCP today with these symptoms and was noted to be hypoxic on room air into the 80s and so sent to the  emergency room. Chest x-ray here reveals a right lower lobe pneumonia and also mild congestive heart failure exacerbation.     VITAL SIGNS:  Blood pressure 120/67, pulse 62, temperature 97.8 F (36.6 C), temperature source Oral, resp. rate 18, height 5\' 5"  (1.651 m), weight 70.2 kg (154 lb 12.8 oz), SpO2 96 %.  I/O:    Intake/Output Summary (Last 24 hours) at 07/25/2017 1330 Last data filed at 07/25/2017 1019 Gross per 24 hour  Intake 480 ml  Output 400 ml  Net 80 ml    PHYSICAL EXAMINATION:  GENERAL:  84 y.o.-year-old patient lying in the bed with no acute distress.  EYES: Pupils equal, round, reactive to light and accommodation. No scleral icterus. Extraocular muscles intact.  HEENT: Head  atraumatic, normocephalic. Oropharynx and nasopharynx clear.  NECK:  Supple, no jugular venous distention. No thyroid enlargement, no tenderness.  LUNGS: Normal breath sounds bilaterally, no wheezing, rales,rhonchi or crepitation. No use of accessory muscles of respiration.  CARDIOVASCULAR: S1, S2 normal. No murmurs, rubs, or gallops.  ABDOMEN: Soft, non-tender, non-distended. Bowel sounds present. No organomegaly or mass.  EXTREMITIES: No pedal edema, cyanosis, or clubbing.  NEUROLOGIC: Cranial nerves II through XII are intact. Muscle strength 5/5 in all extremities. Sensation intact. Gait not checked.  PSYCHIATRIC: The patient is alert and oriented x 3.  SKIN: No obvious rash, lesion, or ulcer.   DATA REVIEW:   CBC Recent Labs  Lab 07/25/17 0417  WBC 7.6  HGB 14.3  HCT 41.1  PLT 156    Chemistries  Recent Labs  Lab 07/25/17 0417  NA 142  K 4.1  CL 104  CO2 26  GLUCOSE 118*  BUN 20  CREATININE 0.95  CALCIUM 9.0    Cardiac Enzymes Recent Labs  Lab 07/25/17 0417  TROPONINI <0.03    Microbiology Results  Results for orders placed or performed during the hospital encounter of 07/24/17  Blood culture (routine x 2)     Status: None (Preliminary result)   Collection Time: 07/24/17  1:45 PM  Result Value Ref Range Status   Specimen Description BLOOD LAC  Final   Special Requests   Final    BOTTLES DRAWN AEROBIC AND ANAEROBIC Blood Culture results may not be optimal due to an excessive volume of blood received in culture bottles   Culture   Final    NO GROWTH < 24 HOURS Performed at [facility], [address], [place], [place] [address]    Report Status PENDING  Incomplete  Blood culture (routine x 2)     Status: None (Preliminary result)   Collection Time: 07/24/17  1:50 PM  Result Value Ref Range Status   Specimen Description BLOOD RFOA  Final   Special Requests   Final    BOTTLES DRAWN AEROBIC AND ANAEROBIC Blood Culture results may not be optimal  due to an excessive volume of blood received in culture bottles   Culture   Final    NO GROWTH < 24 HOURS Performed at [facility], [address] [place], [place] [address]    Report Status PENDING  Incomplete    RADIOLOGY:  Dg Chest 2 View  Result Date: 07/24/2017 CLINICAL DATA:  Shortness of breath and decreased oxygen saturation EXAM: CHEST  2 VIEW COMPARISON:  June 22, 2015 FINDINGS: Patient is status post coronary artery bypass grafting. Heart size is upper normal. Pulmonary vascularity is normal. There is consolidation in the right base with small right pleural effusion. There is underlying  mild interstitial edema. There is aortic atherosclerosis. No adenopathy. No bone lesions. IMPRESSION: Consolidation right base with small right pleural effusion. Suspect pneumonia. There is mild interstitial prominence which may represent mild interstitial edema. Heart upper normal in size. There is aortic atherosclerosis. Aortic Atherosclerosis (ICD10-I70.0). Electronically Signed   By: Lowella Grip III M.D.   On: 07/24/2017 13:32    EKG:   Orders placed or performed during the hospital encounter of 07/24/17  . ED EKG within 10 minutes  . ED EKG within 10 minutes  . EKG 12-Lead  . EKG 12-Lead      Management plans discussed with the patient, family and they are in agreement.  CODE STATUS:     Code Status Orders  (From admission, onward)        Start     Ordered   07/24/17 1642  Full code  Continuous     07/24/17 1641    Code Status History    Date Active Date Inactive Code Status Order ID Comments User Context   This patient has a current code status but no historical code status.      TOTAL TIME TAKING CARE OF THIS PATIENT: 45 minutes.    Avel Peace Valerian Jewel M.D on 07/25/2017 at 1:30 PM  Between 7am to 6pm - Pager - [phone]  After 6pm go to [email or web address] - password [email or web address]  Office  [phone]  CC: Primary care  physician; Mar Daring, PA-C   Note: This dictation was prepared with Dragon dictation along with smaller phrase technology. Any transcriptional errors that result from this process are unintentional.

## 2017-07-29 LAB — CULTURE, BLOOD (ROUTINE X 2)
CULTURE: NO GROWTH
Culture: NO GROWTH

## 2017-07-31 ENCOUNTER — Ambulatory Visit: Payer: PPO | Admitting: Family

## 2017-08-03 DIAGNOSIS — I10 Essential (primary) hypertension: Secondary | ICD-10-CM | POA: Diagnosis not present

## 2017-08-03 DIAGNOSIS — I25708 Atherosclerosis of coronary artery bypass graft(s), unspecified, with other forms of angina pectoris: Secondary | ICD-10-CM | POA: Diagnosis not present

## 2017-08-03 DIAGNOSIS — E7800 Pure hypercholesterolemia, unspecified: Secondary | ICD-10-CM | POA: Diagnosis not present

## 2017-08-09 ENCOUNTER — Telehealth: Payer: Self-pay | Admitting: Physician Assistant

## 2017-08-09 NOTE — Telephone Encounter (Signed)
Patient's daughter states that her mother was in the hospital on 07/24/2017 and has already followed up with Dr. Ubaldo Glassing on 08/03/2017 and is following up with Dr. Ubaldo Glassing again on 10/24/2017.  She is wanting to know if it is necessary to follow up with you.  I advised her that normally we do like to follow up with patient's after hospital admission but she wants to know if it is okay not to come in.  Please call Dee Bragg at [phone] and let her know.

## 2017-08-09 NOTE — Telephone Encounter (Signed)
Please advise. Thanks.  

## 2017-08-09 NOTE — Telephone Encounter (Signed)
It is their personal opinion whether they follow up here or not. They are getting what they need from Dr. Ubaldo Glassing at this point so it is their choice

## 2017-08-09 NOTE — Telephone Encounter (Signed)
Amanda Strickland patient's daughter advised as directed below.  Thanks,  -Joliana Claflin

## 2017-08-14 ENCOUNTER — Telehealth: Payer: Self-pay

## 2017-08-14 NOTE — Telephone Encounter (Signed)
Patient called saying that for the last week, she has noticed light pink blood after wiping. She denies any UTI symptoms. Denies any issues with her bowels. She does not see any blood in her urine or stool. She does have mild abdominal cramping that is relieved with Ibuprofen. She has not had this issue before in the past. She prefers to only see Tawanna Sat. Appt was scheduled for this week for evaluation. Patient is to call the office if symptoms worsen.

## 2017-08-16 ENCOUNTER — Ambulatory Visit (INDEPENDENT_AMBULATORY_CARE_PROVIDER_SITE_OTHER): Payer: PPO | Admitting: Physician Assistant

## 2017-08-16 ENCOUNTER — Encounter: Payer: Self-pay | Admitting: Physician Assistant

## 2017-08-16 VITALS — BP 140/78 | HR 76 | Temp 97.8°F | Resp 16 | Wt 154.0 lb

## 2017-08-16 DIAGNOSIS — R319 Hematuria, unspecified: Secondary | ICD-10-CM

## 2017-08-16 DIAGNOSIS — N950 Postmenopausal bleeding: Secondary | ICD-10-CM | POA: Diagnosis not present

## 2017-08-16 DIAGNOSIS — R5383 Other fatigue: Secondary | ICD-10-CM

## 2017-08-16 DIAGNOSIS — L0292 Furuncle, unspecified: Secondary | ICD-10-CM | POA: Diagnosis not present

## 2017-08-16 LAB — POCT URINALYSIS DIPSTICK
BILIRUBIN UA: NEGATIVE
GLUCOSE UA: NEGATIVE
KETONES UA: NEGATIVE
Leukocytes, UA: NEGATIVE
Nitrite, UA: NEGATIVE
PROTEIN UA: NEGATIVE
Spec Grav, UA: 1.01 (ref 1.010–1.025)
Urobilinogen, UA: 0.2 E.U./dL
pH, UA: 6 (ref 5.0–8.0)

## 2017-08-16 MED ORDER — CEPHALEXIN 500 MG PO CAPS: 500.0000 mg | ORAL_CAPSULE | Freq: Two times a day (BID) | ORAL | 0 refills | Status: DC

## 2017-08-16 NOTE — Progress Notes (Signed)
[name] Female    DOB: 09-28-1930   84 y.o.   MRN: [id number] Visit Date: 08/16/2017  Today's Provider: Mar Daring, PA-C   Chief Complaint  Patient presents with  . Vaginal Bleeding   Subjective:    HPI Patient here today C/O blood on toilette paper x's one week. Patient denies any painful urination or constipation. Patient reports blood amount has not increased since first noticing. Patient reports lower back pain more on left side x's one week.  Patient C/O a "bump" on left side of he back x's several years. Patient reports that bump is painful at times.  Patient here today with her daughter Margarita Grizzle. Margarita Grizzle is requesting that we call Prentiss Bells with any lab result or any updates.    No Known Allergies   Current Outpatient Medications:  .  amLODipine (NORVASC) 5 MG tablet, Take 1 tablet (5 mg total) by mouth daily., Disp: 90 tablet, Rfl: 3 .  aspirin 81 MG tablet, Take 81 mg by mouth daily., Disp: , Rfl:  .  benazepril-hydrochlorthiazide (LOTENSIN HCT) 20-12.5 MG tablet, Take 1 tablet by mouth 2 (two) times daily., Disp: 180 tablet, Rfl: 3 .  budesonide-formoterol (SYMBICORT) 160-4.5 MCG/ACT inhaler, Inhale 2 puffs into the lungs 2 (two) times daily., Disp: 3 Inhaler, Rfl: 3 .  CALCIUM CARBONATE-VIT D-MIN PO, Take 1 tablet by mouth daily. , Disp: , Rfl:  .  fluticasone (FLONASE) 50 MCG/ACT nasal spray, Place 2 sprays into both nostrils daily. (Patient taking differently: Place 2 sprays into both nostrils daily. ), Disp: 16 g, Rfl: 6 .  furosemide (LASIX) 20 MG tablet, Take 1 tablet (20 mg total) by mouth daily., Disp: 30 tablet, Rfl: 0 .  L-Lysine 500 MG CAPS, Take by mouth. Reported on 10/15/2015, Disp: , Rfl:  .  Menthol-Methyl Salicylate (MUSCLE RUB EX), Apply 1 application topically daily as needed. , Disp: , Rfl:  .  metoprolol succinate (TOPROL-XL) 50 MG 24 hr tablet, Take 1 tablet (50 mg total) by mouth daily. Take with or immediately following  a meal., Disp: 90 tablet, Rfl: 3 .  Misc Natural Products (OSTEO BI-FLEX ADV JOINT SHIELD) TABS, Take by mouth., Disp: , Rfl:  .  Multiple Vitamin (MULTIVITAMIN) capsule, Take 1 capsule by mouth daily., Disp: , Rfl:  .  Omega-3 Fatty Acids (FISH OIL) 1000 MG CAPS, Take 1 capsule by mouth daily., Disp: , Rfl:  .  potassium chloride SA (K-DUR,KLOR-CON) 20 MEQ tablet, Take 1 tablet (20 mEq total) by mouth as needed., Disp: 30 tablet, Rfl: 1 .  simvastatin (ZOCOR) 20 MG tablet, Take 1 tablet (20 mg total) by mouth at bedtime., Disp: 90 tablet, Rfl: 3 .  Spacer/Aero-Holding Chambers (AEROCHAMBER MINI CHAMBER) DEVI, Use spacer with inhalers., Disp: 1 Device, Rfl: 0 .  VENTOLIN HFA 108 (90 Base) MCG/ACT inhaler, Inhale 2 puffs into the lungs every 6 (six) hours as needed. As needed, Disp: 3 Inhaler, Rfl: 4 .  vitamin E 100 UNIT capsule, Take 100 Units by mouth daily., Disp: , Rfl:   Review of Systems  Constitutional: Positive for fatigue.  Respiratory: Negative.   Cardiovascular: Negative.   Genitourinary:       Blood noted on TP, in commode after urinating mostly, occasionally with BM and also noted on a panty liner  Musculoskeletal: Positive for back pain.  Skin: Positive for rash.  Neurological: Negative.     Social History   Tobacco Use  . Smoking status:  Never Smoker  . Smokeless tobacco: Never Used  Substance Use Topics  . Alcohol use: No    Alcohol/week: 0.0 oz   Objective:   BP 140/78 (BP Location: Left Arm, Patient Position: Sitting, Cuff Size: Normal)   Pulse 76   Temp 97.8 F (36.6 C)   Resp 16   Wt 154 lb (69.9 kg)   BMI 25.63 kg/m  Vitals:   08/16/17 1351  BP: 140/78  Pulse: 76  Resp: 16  Temp: 97.8 F (36.6 C)  Weight: 154 lb (69.9 kg)     Physical Exam  Constitutional: She appears well-developed and well-nourished. No distress.  Neck: Normal range of motion. Neck supple.  Cardiovascular: Normal rate, regular rhythm and normal heart sounds. Exam reveals no  gallop and no friction rub.  No murmur heard. Pulmonary/Chest: Effort normal and breath sounds normal. No respiratory distress. She has no wheezes. She has no rales.  Abdominal: Soft. Bowel sounds are normal. She exhibits no distension and no mass. There is no tenderness. There is no CVA tenderness. Hernia confirmed negative in the right inguinal area and confirmed negative in the left inguinal area.  Genitourinary: Uterus normal.    No labial fusion. There is no rash, tenderness, lesion or injury on the right labia. There is no rash, tenderness, lesion or injury on the left labia. Cervix exhibits discharge (bloody discharge). Cervix exhibits no motion tenderness and no friability. Right adnexum displays no mass, no tenderness and no fullness. Left adnexum displays no mass, no tenderness and no fullness. There is bleeding in the vagina. No erythema or tenderness in the vagina. No foreign body in the vagina. No signs of injury around the vagina. Vaginal discharge (dark red bloody discharge) found.  Lymphadenopathy:       Right: No inguinal adenopathy present.       Left: No inguinal adenopathy present.  Skin: She is not diaphoretic.     Vitals reviewed.      Assessment & Plan:     1. Postmenopausal vaginal bleeding Will refer to GYN for work up for postmenopausal bleeding. CBC to make sure not anemic from blood loss. - Ambulatory referral to Gynecology - CBC w/Diff/Platelet  2. Hematuria, unspecified type UA normal.  - POCT urinalysis dipstick  3. Fatigue, unspecified type See above medical treatment plan for #1.  - CBC w/Diff/Platelet  4. Boil Will give keflex as below for boil on upper back. Warm compresses. Call if no improvements with treatment and will see if more fluctuant to perform I&D.  - cephALEXin (KEFLEX) 500 MG capsule; Take 1 capsule (500 mg total) by mouth 2 (two) times daily.  Dispense: 14 capsule; Refill: 0       Mar Daring, PA-C  [facility]

## 2017-08-16 NOTE — Patient Instructions (Signed)
Postmenopausal Bleeding Postmenopausal bleeding is any bleeding after menopause. Menopause is when a woman's period stops. Any type of bleeding after menopause is concerning. It should be checked by your doctor. Any treatment will depend on the cause. Follow these instructions at home: Watch your condition for any changes.  Avoid the use of tampons and douches as told by your doctor.  Change your pads often.  Get regular pelvic exams and Pap tests.  Keep all appointments for tests as told by your doctor.  Contact a doctor if:  Your bleeding lasts for more than 1 week.  You have belly (abdominal) pain.  You have bleeding after sex (intercourse). Get help right away if:  You have a fever, chills, a headache, dizziness, muscle aches, and bleeding.  You have strong pain with bleeding.  You have clumps of blood (blood clots) coming from your vagina.  You have bleeding and need more than 1 pad an hour.  You feel like you are going to pass out (faint). This information is not intended to replace advice given to you by your health care provider. Make sure you discuss any questions you have with your health care provider. Document Released: 04/05/2008 Document Revised: 12/03/2015 Document Reviewed: 01/24/2013 Elsevier Interactive Patient Education  2017 [employer] Skin Abscess A skin abscess is an infected area on or under your skin that contains pus and other material. An abscess can happen almost anywhere on your body. Some abscesses break open (rupture) on their own. Most continue to get worse unless they are treated. The infection can spread deeper into the body and into your blood, which can make you feel sick. Treatment usually involves draining the abscess. Follow these instructions at home: Abscess Care  If you have an abscess that has not drained, place a warm, clean, wet washcloth over the abscess several times a day. Do this as told by your doctor.  Follow instructions  from your doctor about how to take care of your abscess. Make sure you: ? Cover the abscess with a bandage (dressing). ? Change your bandage or gauze as told by your doctor. ? Wash your hands with soap and water before you change the bandage or gauze. If you cannot use soap and water, use hand sanitizer.  Check your abscess every day for signs that the infection is getting worse. Check for: ? More redness, swelling, or pain. ? More fluid or blood. ? Warmth. ? More pus or a bad smell. Medicines   Take over-the-counter and prescription medicines only as told by your doctor.  If you were prescribed an antibiotic medicine, take it as told by your doctor. Do not stop taking the antibiotic even if you start to feel better. General instructions  To avoid spreading the infection: ? Do not share personal care items, towels, or hot tubs with others. ? Avoid making skin-to-skin contact with other people.  Keep all follow-up visits as told by your doctor. This is important. Contact a doctor if:  You have more redness, swelling, or pain around your abscess.  You have more fluid or blood coming from your abscess.  Your abscess feels warm when you touch it.  You have more pus or a bad smell coming from your abscess.  You have a fever.  Your muscles ache.  You have chills.  You feel sick. Get help right away if:  You have very bad (severe) pain.  You see red streaks on your skin spreading away from the abscess. This information  is not intended to replace advice given to you by your health care provider. Make sure you discuss any questions you have with your health care provider. Document Released: 12/14/2007 Document Revised: 02/21/2016 Document Reviewed: 05/06/2015 Elsevier Interactive Patient Education  [employer].

## 2017-08-17 ENCOUNTER — Telehealth: Payer: Self-pay | Admitting: Obstetrics and Gynecology

## 2017-08-17 ENCOUNTER — Telehealth: Payer: Self-pay

## 2017-08-17 LAB — CBC WITH DIFFERENTIAL/PLATELET
BASOS ABS: 0 10*3/uL (ref 0.0–0.2)
Basos: 1 %
EOS (ABSOLUTE): 0.3 10*3/uL (ref 0.0–0.4)
Eos: 4 %
HEMOGLOBIN: 15.8 g/dL (ref 11.1–15.9)
Hematocrit: 44.5 % (ref 34.0–46.6)
Immature Grans (Abs): 0 10*3/uL (ref 0.0–0.1)
Immature Granulocytes: 0 %
LYMPHS ABS: 2 10*3/uL (ref 0.7–3.1)
Lymphs: 23 %
MCH: 32.5 pg (ref 26.6–33.0)
MCHC: 35.5 g/dL (ref 31.5–35.7)
MCV: 92 fL (ref 79–97)
MONOCYTES: 8 %
Monocytes Absolute: 0.7 10*3/uL (ref 0.1–0.9)
Neutrophils Absolute: 5.6 10*3/uL (ref 1.4–7.0)
Neutrophils: 64 %
PLATELETS: 167 10*3/uL (ref 150–379)
RBC: 4.86 x10E6/uL (ref 3.77–5.28)
RDW: 13.1 % (ref 12.3–15.4)
WBC: 8.7 10*3/uL (ref 3.4–10.8)

## 2017-08-17 NOTE — Telephone Encounter (Signed)
[facility] referring for Postmenopausal vaginal bleeding. Called and lvm for patient to call to be schedule with Dr. Gilman Schmidt.

## 2017-08-17 NOTE — Telephone Encounter (Signed)
-----   Message from Mar Daring, PA-C sent at 08/17/2017 11:04 AM EST ----- CBC is normal. No anemia noted. Call [place] with results.

## 2017-08-17 NOTE — Telephone Encounter (Signed)
Patient's daughter Karena Addison advised as below.

## 2017-08-21 ENCOUNTER — Other Ambulatory Visit: Payer: Self-pay

## 2017-08-21 ENCOUNTER — Encounter: Payer: Self-pay | Admitting: Obstetrics and Gynecology

## 2017-08-21 ENCOUNTER — Ambulatory Visit (INDEPENDENT_AMBULATORY_CARE_PROVIDER_SITE_OTHER): Payer: PPO | Admitting: Obstetrics and Gynecology

## 2017-08-21 VITALS — BP 120/60 | HR 70 | Ht 65.0 in | Wt 153.0 lb

## 2017-08-21 DIAGNOSIS — N950 Postmenopausal bleeding: Secondary | ICD-10-CM | POA: Diagnosis not present

## 2017-08-21 DIAGNOSIS — N840 Polyp of corpus uteri: Secondary | ICD-10-CM | POA: Diagnosis not present

## 2017-08-21 NOTE — Progress Notes (Signed)
HPI:      [name] is a 84 y.o. Patient presents today for 2 days of vaginal bleeding. She first noticed the bleeding when she was going to bed, She wiped and there was pink discharge on the toilet paper. She has noticed some pain across the front of her abdomen and pain in her back, but she reports that she does have chronic  Back pain. She denies changes in her bowel movments. She was recently diagnosed with CHF and was started on diuretics. She has lost 5-10 lbs. since starting the diuretics. No issues with incontinence or sensation of a bulge  Gynecological History Menopause for more than 30 years She denies abnormal pap smears. Denies fibroids Denies ovarian cysts No issues with incontinence or sensation of a bulge.   Obstetrical History: [id number] Reports a history of 5 full term spontaneous vaginal deliveries.   She is not sexually active.  Contraception: none. Hx of STDs: none. She is postmenopausal.  PMHx: She  has a past medical history of Arthritis, CAD (coronary artery disease), CHF (congestive heart failure) ([place]), Depression, Heart attack ([place]) (1994), and Hypertension. Also,  has a past surgical history that includes Coronary artery bypass graft (2001)., family history includes Breast cancer in her maternal aunt; Heart attack in her father.,  reports that  has never smoked. she has never used smokeless tobacco. She reports that she does not drink alcohol or use drugs.  She  Current Outpatient Medications:  .  amLODipine (NORVASC) 5 MG tablet, Take 1 tablet (5 mg total) by mouth daily., Disp: 90 tablet, Rfl: 3 .  aspirin 81 MG tablet, Take 81 mg by mouth daily., Disp: , Rfl:  .  benazepril-hydrochlorthiazide (LOTENSIN HCT) 20-12.5 MG tablet, Take 1 tablet by mouth 2 (two) times daily., Disp: 180 tablet, Rfl: 3 .  budesonide-formoterol (SYMBICORT) 160-4.5 MCG/ACT inhaler, Inhale 2 puffs into the lungs 2 (two) times daily., Disp: 3 Inhaler, Rfl: 3 .  CALCIUM  CARBONATE-VIT D-MIN PO, Take 1 tablet by mouth daily. , Disp: , Rfl:  .  cephALEXin (KEFLEX) 500 MG capsule, Take 1 capsule (500 mg total) by mouth 2 (two) times daily., Disp: 14 capsule, Rfl: 0 .  fluticasone (FLONASE) 50 MCG/ACT nasal spray, Place 2 sprays into both nostrils daily. (Patient taking differently: Place 2 sprays into both nostrils daily. ), Disp: 16 g, Rfl: 6 .  furosemide (LASIX) 20 MG tablet, Take 1 tablet (20 mg total) by mouth daily., Disp: 30 tablet, Rfl: 0 .  L-Lysine 500 MG CAPS, Take by mouth. Reported on 10/15/2015, Disp: , Rfl:  .  metoprolol succinate (TOPROL-XL) 50 MG 24 hr tablet, Take 1 tablet (50 mg total) by mouth daily. Take with or immediately following a meal., Disp: 90 tablet, Rfl: 3 .  Misc Natural Products (OSTEO BI-FLEX ADV JOINT SHIELD) TABS, Take by mouth., Disp: , Rfl:  .  Multiple Vitamin (MULTIVITAMIN) capsule, Take 1 capsule by mouth daily., Disp: , Rfl:  .  Omega-3 Fatty Acids (FISH OIL) 1000 MG CAPS, Take 1 capsule by mouth daily., Disp: , Rfl:  .  potassium chloride SA (K-DUR,KLOR-CON) 20 MEQ tablet, Take 1 tablet (20 mEq total) by mouth as needed., Disp: 30 tablet, Rfl: 1 .  simvastatin (ZOCOR) 20 MG tablet, Take 1 tablet (20 mg total) by mouth at bedtime., Disp: 90 tablet, Rfl: 3 .  Spacer/Aero-Holding Chambers (AEROCHAMBER MINI CHAMBER) DEVI, Use spacer with inhalers., Disp: 1 Device, Rfl: 0 .  VENTOLIN HFA 108 (90 Base)  MCG/ACT inhaler, Inhale 2 puffs into the lungs every 6 (six) hours as needed. As needed, Disp: 3 Inhaler, Rfl: 4 .  vitamin E 100 UNIT capsule, Take 100 Units by mouth daily., Disp: , Rfl:  .  Menthol-Methyl Salicylate (MUSCLE RUB EX), Apply 1 application topically daily as needed. , Disp: , Rfl:   Also, has No Known Allergies.  Review of Systems  Constitutional: Negative for chills, fever, malaise/fatigue and weight loss.  HENT: Negative for congestion, hearing loss and sinus pain.   Eyes: Negative for blurred vision and double  vision.  Respiratory: Negative for cough, sputum production, shortness of breath and wheezing.   Cardiovascular: Negative for chest pain, palpitations, orthopnea and leg swelling.  Gastrointestinal: Negative for abdominal pain, constipation, diarrhea, nausea and vomiting.  Genitourinary: Negative for dysuria, flank pain, frequency, hematuria and urgency.  Musculoskeletal: Negative for back pain, falls and joint pain.  Skin: Negative for itching and rash.  Neurological: Negative for dizziness and headaches.  Psychiatric/Behavioral: Negative for depression, substance abuse and suicidal ideas. The patient is not nervous/anxious.     Objective: BP 120/60 (BP Location: Right Arm, Patient Position: Sitting, Cuff Size: Normal)   Pulse 70   Ht 5\' 5"  (1.651 m)   Wt 153 lb (69.4 kg)   BMI 25.46 kg/m  Physical Exam  Constitutional: She is oriented to person, place, and time. She appears well-developed.  Genitourinary: Vagina normal. There is no lesion on the right labia. There is no lesion on the left labia. Vagina exhibits no lesion. Right adnexum does not display mass. Left adnexum does not display mass. Cervix does not exhibit motion tenderness.  Genitourinary Comments: No evidence of prolapse. Small amount of active slow vaginal bleeding from cervical os. Uterus sounded to 7cm. Endometrial biopsy performed, scant endometrium.   HENT:  Head: Normocephalic and atraumatic.  Eyes: EOM are normal.  Neck: Neck supple. No thyromegaly present.  Cardiovascular: Normal rate, regular rhythm and normal heart sounds.  Pulmonary/Chest: Effort normal and breath sounds normal. Right breast exhibits no inverted nipple, no mass, no nipple discharge and no skin change. Left breast exhibits no inverted nipple, no mass, no nipple discharge and no skin change.  Abdominal: Soft. Bowel sounds are normal. She exhibits no distension and no mass.  Neurological: She is alert and oriented to person, place, and time.    Skin: Skin is warm and dry.  Psychiatric: She has a normal mood and affect. Her behavior is normal. Judgment and thought content normal.  Vitals reviewed.   Endometrial Biopsy After discussion with the patient regarding her abnormal uterine bleeding I recommended that she proceed with an endometrial biopsy for further diagnosis. The risks, benefits, alternatives, and indications for an endometrial biopsy were discussed with the patient in detail. She understood the risks including infection, bleeding, cervical laceration and uterine perforation.  Verbal consent was obtained.   PROCEDURE NOTE:  [place] endometrial biopsy was performed using aseptic technique with iodine preparation.  The uterus was sounded to a length of 7 cm.  Adequate sampling was obtained with minimal blood loss.  The patient tolerated the procedure well.  Disposition will be pending pathology.  Barnett Applebaum, MD, [facility], [facility] 08/21/2017  9:42 PM   ASSESSMENT/PLAN:  postmenopausal bleeding  Problem List Items Addressed This Visit      Other   Postmenopausal bleeding - Primary   Relevant Orders   US PELVIS TRANSVANGINAL NON-OB (TV ONLY)   Surgical pathology  EMB sounded to 7cm Scant sampling  Call daughter (DEE with results) Follow up in 2 wks, Transvaginal US at next visit.  Adrian Prows MD 08/21/17 9:42 PM

## 2017-08-23 NOTE — Addendum Note (Signed)
Addended by: Clemetine Marker D on: 08/23/2017 03:11 PM   Modules accepted: Orders

## 2017-08-25 LAB — PATHOLOGY

## 2017-08-28 ENCOUNTER — Telehealth: Payer: Self-pay | Admitting: Obstetrics and Gynecology

## 2017-08-28 NOTE — Progress Notes (Signed)
Spoke with patient's daughter. Recommended a sonohysterography to evaluate cavity for endometrial polyp.  They will call to schedule if that is what the patient would want.

## 2017-08-28 NOTE — Progress Notes (Signed)
Called daughter's phone number, left generic message to return my phone call.  No hyperplasia.

## 2017-08-28 NOTE — Telephone Encounter (Signed)
Per Pt's daughter calling back to speak with Dr. Gilman Schmidt due to missed call. Please advise

## 2017-08-29 ENCOUNTER — Other Ambulatory Visit: Payer: Self-pay | Admitting: Obstetrics and Gynecology

## 2017-08-29 ENCOUNTER — Telehealth: Payer: Self-pay | Admitting: Obstetrics and Gynecology

## 2017-08-29 DIAGNOSIS — N840 Polyp of corpus uteri: Secondary | ICD-10-CM

## 2017-08-29 NOTE — Telephone Encounter (Signed)
Thank you, order placed

## 2017-08-29 NOTE — Telephone Encounter (Signed)
Please update scheduling with correct order and contact pt's daughter. Thank you!

## 2017-08-29 NOTE — Telephone Encounter (Signed)
Please place order for [facility] for front desk to schedule. Thank you.

## 2017-08-29 NOTE — Telephone Encounter (Signed)
Patient's daughter Karena Addison called about getting her set up for the u/s that is needed. Will need a new order for u/s to be done for the guided sonohysterography appointment that is needed. .   Dee# [phone]

## 2017-09-06 ENCOUNTER — Telehealth: Payer: Self-pay | Admitting: Physician Assistant

## 2017-09-11 ENCOUNTER — Ambulatory Visit: Payer: PPO | Admitting: Obstetrics and Gynecology

## 2017-09-11 ENCOUNTER — Ambulatory Visit (INDEPENDENT_AMBULATORY_CARE_PROVIDER_SITE_OTHER): Payer: PPO

## 2017-09-11 ENCOUNTER — Other Ambulatory Visit: Payer: PPO

## 2017-09-11 DIAGNOSIS — N950 Postmenopausal bleeding: Secondary | ICD-10-CM | POA: Diagnosis not present

## 2017-09-11 DIAGNOSIS — N840 Polyp of corpus uteri: Secondary | ICD-10-CM | POA: Diagnosis not present

## 2017-09-11 NOTE — Progress Notes (Signed)
Sonohysterogram Procedure Note  The indications for this procedure were reviewed with the patient. The procedure was explained in detail and all questions were answered.  The patient was placed in the lithotomy position. A graves speculum was introduced into the vagina and the cervix was visualized. The cervix was prepped with iodine solution. A Cook's Hysterography catheter was then introduced into the uterine cavity and the speculum was removed.   Sterile sonohysterography with 3D Reconstruction was performed. The endometrial cavity was distended with sterile saline. The findings are as follows: no significant abnormalities were found. No polyp seen. Endometrium thin, 75mm.  Patient has not had bleeding for 1 week. Discussed options with patient such as an estrogen cream for atrophic bleeding, but patient would like to do nothing at this time.  Asked patient to return if uterine bleeding returns or worsens.   The patient tolerated the procedure well without complication, and was discharged to home.    Adrian Prows MD [facility] 09/11/17 4:17 PM

## 2017-10-02 NOTE — Telephone Encounter (Signed)
LMTCB and schedule AWV.  -MM

## 2017-10-10 ENCOUNTER — Telehealth: Payer: Self-pay

## 2017-10-10 NOTE — Telephone Encounter (Signed)
Noted  

## 2017-10-10 NOTE — Telephone Encounter (Signed)
Called pt to schedule her AWV for this year with myself and pt states she does not want the wellness visit with the nurse this year. FYI to PCP! -MM

## 2017-10-12 ENCOUNTER — Telehealth: Payer: Self-pay | Admitting: Physician Assistant

## 2017-10-12 NOTE — Telephone Encounter (Signed)
Left patient a vmsg to return call for a appt with [facility]. CPE is already scheduled.

## 2017-10-24 DIAGNOSIS — I10 Essential (primary) hypertension: Secondary | ICD-10-CM | POA: Diagnosis not present

## 2017-10-24 DIAGNOSIS — E7800 Pure hypercholesterolemia, unspecified: Secondary | ICD-10-CM | POA: Diagnosis not present

## 2017-10-24 DIAGNOSIS — I25708 Atherosclerosis of coronary artery bypass graft(s), unspecified, with other forms of angina pectoris: Secondary | ICD-10-CM | POA: Diagnosis not present

## 2017-11-13 ENCOUNTER — Encounter: Payer: Self-pay | Admitting: Physician Assistant

## 2017-11-14 DIAGNOSIS — H02105 Unspecified ectropion of left lower eyelid: Secondary | ICD-10-CM | POA: Diagnosis not present

## 2018-01-23 ENCOUNTER — Encounter: Payer: Self-pay | Admitting: Podiatry

## 2018-01-23 ENCOUNTER — Ambulatory Visit: Payer: PPO | Admitting: Podiatry

## 2018-01-23 DIAGNOSIS — L600 Ingrowing nail: Secondary | ICD-10-CM

## 2018-02-01 NOTE — Progress Notes (Signed)
   Subjective: Patient presents today for evaluation of pain to the medial and lateral borders of the right hallux that began 6-7 weeks ago. Patient is concerned for possible ingrown nail. Wearing shoes and applying pressure to the area increases the pain. She has not done anything for treatment. Patient presents today for further treatment and evaluation.  Past Medical History:  Diagnosis Date  . Arthritis   . CAD (coronary artery disease)   . CHF (congestive heart failure) ([facility])    diastolic heart failure  . Depression   . Heart attack ([place]) 1994  . Hypertension     Objective:  General: Well developed, nourished, in no acute distress, alert and oriented x3   Dermatology: Skin is warm, dry and supple bilateral. Medial and lateral borders of the right hallux appears to be erythematous with evidence of an ingrowing nail. Pain on palpation noted to the border of the nail fold. The remaining nails appear unremarkable at this time. There are no open sores, lesions.  Vascular: Dorsalis Pedis artery and Posterior Tibial artery pedal pulses palpable. No lower extremity edema noted.   Neruologic: Grossly intact via light touch bilateral.  Musculoskeletal: Muscular strength within normal limits in all groups bilateral. Normal range of motion noted to all pedal and ankle joints.   Assesement: #1 Paronychia with ingrowing nail medial and lateral borders of the right hallux  #2 Pain in toe #3 Incurvated nail  Plan of Care:  1. Patient evaluated.  2. Mechanical debridement of the right hallux performed using a nail nipper. Filed with dremel without incident.  3. If patient does not feel relief, return to clinic for permanent nail avulsion procedure with phenol.  4. Return to clinic as needed.   Edrick Kins, DPM [facility]  Dr. Edrick Kins, [address]                                        [place], [place] [id number]                Office [phone]    Fax [phone]

## 2018-04-02 ENCOUNTER — Encounter: Payer: PPO | Admitting: Physician Assistant

## 2018-04-03 ENCOUNTER — Encounter: Payer: Self-pay | Admitting: Physician Assistant

## 2018-04-03 ENCOUNTER — Ambulatory Visit (INDEPENDENT_AMBULATORY_CARE_PROVIDER_SITE_OTHER): Payer: PPO | Admitting: Physician Assistant

## 2018-04-03 VITALS — BP 126/64 | HR 60 | Temp 97.6°F | Wt 154.0 lb

## 2018-04-03 DIAGNOSIS — Z23 Encounter for immunization: Secondary | ICD-10-CM | POA: Diagnosis not present

## 2018-04-03 DIAGNOSIS — I10 Essential (primary) hypertension: Secondary | ICD-10-CM

## 2018-04-03 DIAGNOSIS — E7800 Pure hypercholesterolemia, unspecified: Secondary | ICD-10-CM | POA: Diagnosis not present

## 2018-04-03 DIAGNOSIS — Z0000 Encounter for general adult medical examination without abnormal findings: Secondary | ICD-10-CM

## 2018-04-03 DIAGNOSIS — J449 Chronic obstructive pulmonary disease, unspecified: Secondary | ICD-10-CM

## 2018-04-03 DIAGNOSIS — I5032 Chronic diastolic (congestive) heart failure: Secondary | ICD-10-CM

## 2018-04-03 DIAGNOSIS — Z6825 Body mass index (BMI) 25.0-25.9, adult: Secondary | ICD-10-CM | POA: Diagnosis not present

## 2018-04-03 MED ORDER — AMLODIPINE BESYLATE 5 MG PO TABS: 5.0000 mg | ORAL_TABLET | Freq: Every day | ORAL | 3 refills | Status: DC

## 2018-04-03 MED ORDER — FUROSEMIDE 20 MG PO TABS: 20.0000 mg | ORAL_TABLET | Freq: Every day | ORAL | 3 refills | Status: DC

## 2018-04-03 MED ORDER — SIMVASTATIN 20 MG PO TABS: 20.0000 mg | ORAL_TABLET | Freq: Every day | ORAL | 3 refills | Status: DC

## 2018-04-03 MED ORDER — POTASSIUM CHLORIDE CRYS ER 20 MEQ PO TBCR: 20.0000 meq | EXTENDED_RELEASE_TABLET | ORAL | 3 refills | Status: DC | PRN

## 2018-04-03 MED ORDER — BENAZEPRIL-HYDROCHLOROTHIAZIDE 20-12.5 MG PO TABS: 1.0000 | ORAL_TABLET | Freq: Two times a day (BID) | ORAL | 3 refills | Status: DC

## 2018-04-03 MED ORDER — BUDESONIDE-FORMOTEROL FUMARATE 160-4.5 MCG/ACT IN AERO: 2.0000 | INHALATION_SPRAY | Freq: Two times a day (BID) | RESPIRATORY_TRACT | 3 refills | Status: DC

## 2018-04-03 MED ORDER — VENTOLIN HFA 108 (90 BASE) MCG/ACT IN AERS: 2.0000 | INHALATION_SPRAY | Freq: Four times a day (QID) | RESPIRATORY_TRACT | 4 refills | Status: DC | PRN

## 2018-04-03 MED ORDER — METOPROLOL SUCCINATE ER 50 MG PO TB24: 50.0000 mg | ORAL_TABLET | Freq: Every day | ORAL | 3 refills | Status: DC

## 2018-04-03 NOTE — Patient Instructions (Signed)
Health Maintenance for Postmenopausal Women Menopause is a normal process in which your reproductive ability comes to an end. This process happens gradually over a span of months to years, usually between the ages of 22 and 9. Menopause is complete when you have missed 12 consecutive menstrual periods. It is important to talk with your health care provider about some of the most common conditions that affect postmenopausal women, such as heart disease, cancer, and bone loss (osteoporosis). Adopting a healthy lifestyle and getting preventive care can help to promote your health and wellness. Those actions can also lower your chances of developing some of these common conditions. What should I know about menopause? During menopause, you may experience a number of symptoms, such as:  Moderate-to-severe hot flashes.  Night sweats.  Decrease in sex drive.  Mood swings.  Headaches.  Tiredness.  Irritability.  Memory problems.  Insomnia.  Choosing to treat or not to treat menopausal changes is an individual decision that you make with your health care provider. What should I know about hormone replacement therapy and supplements? Hormone therapy products are effective for treating symptoms that are associated with menopause, such as hot flashes and night sweats. Hormone replacement carries certain risks, especially as you become older. If you are thinking about using estrogen or estrogen with progestin treatments, discuss the benefits and risks with your health care provider. What should I know about heart disease and stroke? Heart disease, heart attack, and stroke become more likely as you age. This may be due, in part, to the hormonal changes that your body experiences during menopause. These can affect how your body processes dietary fats, triglycerides, and cholesterol. Heart attack and stroke are both medical emergencies. There are many things that you can do to help prevent heart disease  and stroke:  Have your blood pressure checked at least every 1-2 years. High blood pressure causes heart disease and increases the risk of stroke.  If you are 53-22 years old, ask your health care provider if you should take aspirin to prevent a heart attack or a stroke.  Do not use any tobacco products, including cigarettes, chewing tobacco, or electronic cigarettes. If you need help quitting, ask your health care provider.  It is important to eat a healthy diet and maintain a healthy weight. ? Be sure to include plenty of vegetables, fruits, low-fat dairy products, and lean protein. ? Avoid eating foods that are high in solid fats, added sugars, or salt (sodium).  Get regular exercise. This is one of the most important things that you can do for your health. ? Try to exercise for at least 150 minutes each week. The type of exercise that you do should increase your heart rate and make you sweat. This is known as moderate-intensity exercise. ? Try to do strengthening exercises at least twice each week. Do these in addition to the moderate-intensity exercise.  Know your numbers.Ask your health care provider to check your cholesterol and your blood glucose. Continue to have your blood tested as directed by your health care provider.  What should I know about cancer screening? There are several types of cancer. Take the following steps to reduce your risk and to catch any cancer development as early as possible. Breast Cancer  Practice breast self-awareness. ? This means understanding how your breasts normally appear and feel. ? It also means doing regular breast self-exams. Let your health care provider know about any changes, no matter how small.  If you are 40  or older, have a clinician do a breast exam (clinical breast exam or CBE) every year. Depending on your age, family history, and medical history, it may be recommended that you also have a yearly breast X-ray (mammogram).  If you  have a family history of breast cancer, talk with your health care provider about genetic screening.  If you are at high risk for breast cancer, talk with your health care provider about having an MRI and a mammogram every year.  Breast cancer (BRCA) gene test is recommended for women who have family members with BRCA-related cancers. Results of the assessment will determine the need for genetic counseling and BRCA1 and for BRCA2 testing. BRCA-related cancers include these types: ? Breast. This occurs in males or females. ? Ovarian. ? Tubal. This may also be called fallopian tube cancer. ? Cancer of the abdominal or pelvic lining (peritoneal cancer). ? Prostate. ? Pancreatic.  Cervical, Uterine, and Ovarian Cancer Your health care provider may recommend that you be screened regularly for cancer of the pelvic organs. These include your ovaries, uterus, and vagina. This screening involves a pelvic exam, which includes checking for microscopic changes to the surface of your cervix (Pap test).  For women ages 21-65, health care providers may recommend a pelvic exam and a Pap test every three years. For women ages 79-65, they may recommend the Pap test and pelvic exam, combined with testing for human papilloma virus (HPV), every five years. Some types of HPV increase your risk of cervical cancer. Testing for HPV may also be done on women of any age who have unclear Pap test results.  Other health care providers may not recommend any screening for nonpregnant women who are considered low risk for pelvic cancer and have no symptoms. Ask your health care provider if a screening pelvic exam is right for you.  If you have had past treatment for cervical cancer or a condition that could lead to cancer, you need Pap tests and screening for cancer for at least 20 years after your treatment. If Pap tests have been discontinued for you, your risk factors (such as having a new sexual partner) need to be  reassessed to determine if you should start having screenings again. Some women have medical problems that increase the chance of getting cervical cancer. In these cases, your health care provider may recommend that you have screening and Pap tests more often.  If you have a family history of uterine cancer or ovarian cancer, talk with your health care provider about genetic screening.  If you have vaginal bleeding after reaching menopause, tell your health care provider.  There are currently no reliable tests available to screen for ovarian cancer.  Lung Cancer Lung cancer screening is recommended for adults 69-62 years old who are at high risk for lung cancer because of a history of smoking. A yearly low-dose CT scan of the lungs is recommended if you:  Currently smoke.  Have a history of at least 30 pack-years of smoking and you currently smoke or have quit within the past 15 years. A pack-year is smoking an average of one pack of cigarettes per day for one year.  Yearly screening should:  Continue until it has been 15 years since you quit.  Stop if you develop a health problem that would prevent you from having lung cancer treatment.  Colorectal Cancer  This type of cancer can be detected and can often be prevented.  Routine colorectal cancer screening usually begins at  age 42 and continues through age 45.  If you have risk factors for colon cancer, your health care provider may recommend that you be screened at an earlier age.  If you have a family history of colorectal cancer, talk with your health care provider about genetic screening.  Your health care provider may also recommend using home test kits to check for hidden blood in your stool.  A small camera at the end of a tube can be used to examine your colon directly (sigmoidoscopy or colonoscopy). This is done to check for the earliest forms of colorectal cancer.  Direct examination of the colon should be repeated every  5-10 years until age 71. However, if early forms of precancerous polyps or small growths are found or if you have a family history or genetic risk for colorectal cancer, you may need to be screened more often.  Skin Cancer  Check your skin from head to toe regularly.  Monitor any moles. Be sure to tell your health care provider: ? About any new moles or changes in moles, especially if there is a change in a mole's shape or color. ? If you have a mole that is larger than the size of a pencil eraser.  If any of your family members has a history of skin cancer, especially at a young age, talk with your health care provider about genetic screening.  Always use sunscreen. Apply sunscreen liberally and repeatedly throughout the day.  Whenever you are outside, protect yourself by wearing long sleeves, pants, a wide-brimmed hat, and sunglasses.  What should I know about osteoporosis? Osteoporosis is a condition in which bone destruction happens more quickly than new bone creation. After menopause, you may be at an increased risk for osteoporosis. To help prevent osteoporosis or the bone fractures that can happen because of osteoporosis, the following is recommended:  If you are 46-71 years old, get at least 1,000 mg of calcium and at least 600 mg of vitamin D per day.  If you are older than age 55 but younger than age 65, get at least 1,200 mg of calcium and at least 600 mg of vitamin D per day.  If you are older than age 54, get at least 1,200 mg of calcium and at least 800 mg of vitamin D per day.  Smoking and excessive alcohol intake increase the risk of osteoporosis. Eat foods that are rich in calcium and vitamin D, and do weight-bearing exercises several times each week as directed by your health care provider. What should I know about how menopause affects my mental health? Depression may occur at any age, but it is more common as you become older. Common symptoms of depression  include:  Low or sad mood.  Changes in sleep patterns.  Changes in appetite or eating patterns.  Feeling an overall lack of motivation or enjoyment of activities that you previously enjoyed.  Frequent crying spells.  Talk with your health care provider if you think that you are experiencing depression. What should I know about immunizations? It is important that you get and maintain your immunizations. These include:  Tetanus, diphtheria, and pertussis (Tdap) booster vaccine.  Influenza every year before the flu season begins.  Pneumonia vaccine.  Shingles vaccine.  Your health care provider may also recommend other immunizations. This information is not intended to replace advice given to you by your health care provider. Make sure you discuss any questions you have with your health care provider. Document Released: 08/19/2005  Document Revised: 01/15/2016 Document Reviewed: 03/31/2015 Elsevier Interactive Patient Education  2018 Elsevier Inc[employer]  

## 2018-04-03 NOTE — Progress Notes (Signed)
[name], Female    DOB: 1931-07-11, 84 y.o.   MRN: [id number] Visit Date: 04/09/2018  Today's Provider: Mar Daring, PA-C   Chief Complaint  Patient presents with  . Medicare Wellness   Subjective:     Complete Physical Amanda Strickland is a 84 y.o. female. She feels well. She reports exercising some. She reports she is sleeping well. -----------------------------------------------------------  Review of Systems  Constitutional: Negative.   HENT: Positive for postnasal drip, sneezing and sore throat.   Eyes: Negative.   Cardiovascular: Negative.   Gastrointestinal: Negative.   Endocrine: Negative.   Genitourinary: Negative.   Musculoskeletal: Negative.   Skin: Negative.   Allergic/Immunologic: Positive for environmental allergies.  Neurological: Negative.   Hematological: Negative.   Psychiatric/Behavioral: Negative.     Social History   Socioeconomic History  . Marital status: Widowed    Spouse name: Not on file  . Number of children: Not on file  . Years of education: Not on file  . Highest education level: Not on file  Occupational History  . Not on file  Social Needs  . Financial resource strain: Not on file  . Food insecurity:    Worry: Not on file    Inability: Not on file  . Transportation needs:    Medical: Not on file    Non-medical: Not on file  Tobacco Use  . Smoking status: Never Smoker  . Smokeless tobacco: Never Used  Substance and Sexual Activity  . Alcohol use: No    Alcohol/week: 0.0 standard drinks  . Drug use: No  . Sexual activity: Not on file  Lifestyle  . Physical activity:    Days per week: Not on file    Minutes per session: Not on file  . Stress: Not on file  Relationships  . Social connections:    Talks on phone: Not on file    Gets together: Not on file    Attends religious service: Not on file    Active member of club or organization: Not on file    Attends meetings of clubs or  organizations: Not on file    Relationship status: Not on file  . Intimate partner violence:    Fear of current or ex partner: Not on file    Emotionally abused: Not on file    Physically abused: Not on file    Forced sexual activity: Not on file  Other Topics Concern  . Not on file  Social History Narrative   Very independent at baseline.    Past Medical History:  Diagnosis Date  . Arthritis   . CAD (coronary artery disease)   . CHF (congestive heart failure) (HCC)    diastolic heart failure  . Depression   . Heart attack ([place]) 1994  . Hypertension      Patient Active Problem List   Diagnosis Date Noted  . Postmenopausal bleeding 08/21/2017  . CHF exacerbation ([place]) 07/24/2017  . COPD (chronic obstructive pulmonary disease) ([place]) 10/15/2015  . Chronic kidney disease (CKD), stage III (moderate) ([place]) 05/25/2015  . Congestive heart failure due to high blood pressure ([place]) 05/25/2015  . Fasciculation 05/25/2015  . Blood glucose elevated 05/25/2015  . OP (osteoporosis) 05/25/2015  . Leg varices 05/25/2015  . Chronic diastolic heart failure ([place]) 11/19/2014  . HTN (hypertension) 11/19/2014  . Dizziness 11/19/2014  . CAD (coronary artery disease) of bypass graft 01/29/2014  . History of colon polyps 02/18/2010  . Arteriosclerosis of  coronary artery 03/30/2009  . Hypercholesteremia 03/30/2009    Past Surgical History:  Procedure Laterality Date  . CORONARY ARTERY BYPASS GRAFT  2001    Her family history includes Breast cancer in her maternal aunt; Heart attack in her father.      Current Outpatient Medications:  .  amLODipine (NORVASC) 5 MG tablet, Take 1 tablet (5 mg total) by mouth daily., Disp: 90 tablet, Rfl: 3 .  aspirin 81 MG tablet, Take 81 mg by mouth daily., Disp: , Rfl:  .  benazepril-hydrochlorthiazide (LOTENSIN HCT) 20-12.5 MG tablet, Take 1 tablet by mouth 2 (two) times daily., Disp: 180 tablet, Rfl: 3 .  budesonide-formoterol (SYMBICORT) 160-4.5 MCG/ACT  inhaler, Inhale 2 puffs into the lungs 2 (two) times daily., Disp: 3 Inhaler, Rfl: 3 .  CALCIUM CARBONATE-VIT D-MIN PO, Take 1 tablet by mouth daily. , Disp: , Rfl:  .  fluticasone (FLONASE) 50 MCG/ACT nasal spray, Place 2 sprays into both nostrils daily. (Patient taking differently: Place 2 sprays into both nostrils daily. ), Disp: 16 g, Rfl: 6 .  furosemide (LASIX) 20 MG tablet, Take 1 tablet (20 mg total) by mouth daily., Disp: 90 tablet, Rfl: 3 .  L-Lysine 500 MG CAPS, Take by mouth. Reported on 10/15/2015, Disp: , Rfl:  .  Menthol-Methyl Salicylate (MUSCLE RUB EX), Apply 1 application topically daily as needed. , Disp: , Rfl:  .  metoprolol succinate (TOPROL-XL) 50 MG 24 hr tablet, Take 1 tablet (50 mg total) by mouth daily. Take with or immediately following a meal., Disp: 90 tablet, Rfl: 3 .  Misc Natural Products (OSTEO BI-FLEX ADV JOINT SHIELD) TABS, Take by mouth., Disp: , Rfl:  .  Multiple Vitamin (MULTIVITAMIN) capsule, Take 1 capsule by mouth daily., Disp: , Rfl:  .  Omega-3 Fatty Acids (FISH OIL) 1000 MG CAPS, Take 1 capsule by mouth daily., Disp: , Rfl:  .  potassium chloride SA (K-DUR,KLOR-CON) 20 MEQ tablet, Take 1 tablet (20 mEq total) by mouth as needed., Disp: 90 tablet, Rfl: 3 .  simvastatin (ZOCOR) 20 MG tablet, Take 1 tablet (20 mg total) by mouth at bedtime., Disp: 90 tablet, Rfl: 3 .  Spacer/Aero-Holding Chambers (AEROCHAMBER MINI CHAMBER) DEVI, Use spacer with inhalers., Disp: 1 Device, Rfl: 0 .  VENTOLIN HFA 108 (90 Base) MCG/ACT inhaler, Inhale 2 puffs into the lungs every 6 (six) hours as needed. As needed, Disp: 3 Inhaler, Rfl: 4 .  vitamin E 100 UNIT capsule, Take 100 Units by mouth daily., Disp: , Rfl:   Patient Care Team: Mar Daring, PA-C as PCP - General (Family Medicine) Ubaldo Glassing Javier Docker, MD as Consulting Physician (Cardiology) Eulogio Bear, MD as Consulting Physician (Ophthalmology) Lillia Carmel Jeralene Huff, MD as Referring Physician (Dermatology)      Objective:   Vitals: BP 126/64 (BP Location: Left Arm, Patient Position: Sitting, Cuff Size: Normal)   Pulse 60   Temp 97.6 F (36.4 C) (Oral)   Wt 154 lb (69.9 kg)   SpO2 95%   BMI 25.63 kg/m   Physical Exam  Constitutional: She is oriented to person, place, and time. She appears well-developed and well-nourished. No distress.  HENT:  Head: Normocephalic and atraumatic.  Right Ear: Hearing, tympanic membrane, external ear and ear canal normal.  Left Ear: Hearing, tympanic membrane, external ear and ear canal normal.  Nose: Nose normal.  Mouth/Throat: Uvula is midline, oropharynx is clear and moist and mucous membranes are normal. No oropharyngeal exudate.  Eyes: Pupils are equal, round, and reactive  to light. Conjunctivae and EOM are normal. Right eye exhibits no discharge. Left eye exhibits no discharge. No scleral icterus.  Neck: Normal range of motion. Neck supple. No JVD present. Carotid bruit is not present. No tracheal deviation present. No thyromegaly present.  Cardiovascular: Normal rate, regular rhythm, normal heart sounds and intact distal pulses. Exam reveals no gallop and no friction rub.  No murmur heard. Pulmonary/Chest: Effort normal and breath sounds normal. No respiratory distress. She has no wheezes. She has no rales. She exhibits no tenderness.  Abdominal: Soft. Bowel sounds are normal. She exhibits no distension and no mass. There is no tenderness. There is no rebound and no guarding.  Musculoskeletal: Normal range of motion. She exhibits no edema or tenderness.  Lymphadenopathy:    She has no cervical adenopathy.  Neurological: She is alert and oriented to person, place, and time.  Skin: Skin is warm and dry. No rash noted. She is not diaphoretic.  Psychiatric: She has a normal mood and affect. Her behavior is normal. Judgment and thought content normal.  Vitals reviewed.   Activities of Daily Living In your present state of health, do you have any  difficulty performing the following activities: 04/09/2018 07/25/2017  Hearing? N -  Vision? N -  Difficulty concentrating or making decisions? N -  Walking or climbing stairs? Y -  Dressing or bathing? N -  Doing errands, shopping? N N  Some recent data might be hidden    Fall Risk Assessment Fall Risk  04/03/2018 10/20/2016 10/15/2015 06/29/2015  Falls in the past year? No No No No  Injury with Fall? - - - No     Depression Screen PHQ 2/9 Scores 04/09/2018 04/03/2018 10/20/2016 10/15/2015  PHQ - 2 Score 0 0 2 0  PHQ- 9 Score 1 1 3  -    Cognitive Testing - 6-CIT  Correct? Score   What year is it? yes 0 0 or 4  What month is it? yes 0 0 or 3  Memorize:    Pia Mau,  44,  [address]  [place],      What time is it? (within 1 hour) yes 0 0 or 3  Count backwards from 20 yes 0 0, 2, or 4  Name the months of the year yes 0 0, 2, or 4  Repeat name & address above no 3 0, 2, 4, 6, 8, or 10       TOTAL SCORE  3/28   Interpretation:  Normal  Normal (0-7) Abnormal (8-28)       Assessment & Plan:    Annual Physical Reviewed patient's Family Medical History Reviewed and updated list of patient's medical providers Assessment of cognitive impairment was done Assessed patient's functional ability Established a written schedule for health screening [place] Completed and Reviewed  Exercise Activities and Dietary recommendations Goals    . Exercise      Recommend increasing exercise. Pt to start walking 5 days a week for 20-30 minutes at a time.        Immunization History  Administered Date(s) Administered  . Influenza Split 03/30/2009, 04/06/2010  . Influenza, High Dose Seasonal PF 05/12/2014, 05/19/2016, 04/24/2017, 04/03/2018  . Influenza,inj,Quad PF,6+ Mos 05/12/2015  . Pneumococcal Conjugate-13 05/12/2014  . Pneumococcal Polysaccharide-23 10/20/2016  . Td 09/13/2012  . Tdap 09/13/2012    Health Maintenance  Topic Date Due  . Samul Dada   09/14/2022  . INFLUENZA VACCINE  Completed  . DEXA SCAN  Completed  .  PNA vac Low Risk Adult  Completed     Discussed health benefits of physical activity, and encouraged her to engage in regular exercise appropriate for her age and condition.    1. Medicare annual wellness visit, subsequent Normal exam today. Up to date on all vaccinations.   2. Chronic diastolic heart failure (HCC) Stable. Diagnosis pulled for medication refill. Continue current medical treatment plan. - potassium chloride SA (K-DUR,KLOR-CON) 20 MEQ tablet; Take 1 tablet (20 mEq total) by mouth as needed.  Dispense: 90 tablet; Refill: 3 - furosemide (LASIX) 20 MG tablet; Take 1 tablet (20 mg total) by mouth daily.  Dispense: 90 tablet; Refill: 3  3. Essential hypertension Stable. Diagnosis pulled for medication refill. Continue current medical treatment plan. Will check labs as below and f/u pending results. - CBC w/Diff/Platelet - Comprehensive Metabolic Panel (CMET) - benazepril-hydrochlorthiazide (LOTENSIN HCT) 20-12.5 MG tablet; Take 1 tablet by mouth 2 (two) times daily.  Dispense: 180 tablet; Refill: 3 - amLODipine (NORVASC) 5 MG tablet; Take 1 tablet (5 mg total) by mouth daily.  Dispense: 90 tablet; Refill: 3 - metoprolol succinate (TOPROL-XL) 50 MG 24 hr tablet; Take 1 tablet (50 mg total) by mouth daily. Take with or immediately following a meal.  Dispense: 90 tablet; Refill: 3  4. Hypercholesteremia Stable. Diagnosis pulled for medication refill. Continue current medical treatment plan. Will check labs as below and f/u pending results. - CBC w/Diff/Platelet - Lipid Profile - simvastatin (ZOCOR) 20 MG tablet; Take 1 tablet (20 mg total) by mouth at bedtime.  Dispense: 90 tablet; Refill: 3  5. BMI 25.0-25.9,adult Counseled patient on healthy lifestyle modifications including dieting and exercise.   6. Chronic obstructive pulmonary disease, unspecified COPD type ([place]) Stable. Diagnosis pulled for  medication refill. Continue current medical treatment plan. - budesonide-formoterol (SYMBICORT) 160-4.5 MCG/ACT inhaler; Inhale 2 puffs into the lungs 2 (two) times daily.  Dispense: 3 Inhaler; Refill: 3 - VENTOLIN HFA 108 (90 Base) MCG/ACT inhaler; Inhale 2 puffs into the lungs every 6 (six) hours as needed. As needed  Dispense: 3 Inhaler; Refill: 4  7. Need for influenza vaccination Flu vaccine given today without complication. Patient sat upright for 15 minutes to check for adverse reaction before being released. - Flu vaccine HIGH DOSE PF (Fluzone High dose)  ------------------------------------------------------------------------------------------------------------    Mar Daring, PA-C  [facility]

## 2018-04-04 ENCOUNTER — Telehealth: Payer: Self-pay

## 2018-04-04 LAB — CBC WITH DIFFERENTIAL/PLATELET
BASOS: 1 %
Basophils Absolute: 0.1 10*3/uL (ref 0.0–0.2)
EOS (ABSOLUTE): 0.5 10*3/uL — AB (ref 0.0–0.4)
Eos: 6 %
Hematocrit: 40.5 % (ref 34.0–46.6)
Hemoglobin: 13.9 g/dL (ref 11.1–15.9)
IMMATURE GRANS (ABS): 0 10*3/uL (ref 0.0–0.1)
IMMATURE GRANULOCYTES: 0 %
LYMPHS: 23 %
Lymphocytes Absolute: 2 10*3/uL (ref 0.7–3.1)
MCH: 30.4 pg (ref 26.6–33.0)
MCHC: 34.3 g/dL (ref 31.5–35.7)
MCV: 89 fL (ref 79–97)
Monocytes Absolute: 0.9 10*3/uL (ref 0.1–0.9)
Monocytes: 10 %
NEUTROS PCT: 60 %
Neutrophils Absolute: 4.9 10*3/uL (ref 1.4–7.0)
Platelets: 168 10*3/uL (ref 150–450)
RBC: 4.57 x10E6/uL (ref 3.77–5.28)
RDW: 13.2 % (ref 12.3–15.4)
WBC: 8.3 10*3/uL (ref 3.4–10.8)

## 2018-04-04 LAB — COMPREHENSIVE METABOLIC PANEL
ALT: 26 IU/L (ref 0–32)
AST: 34 IU/L (ref 0–40)
Albumin/Globulin Ratio: 1.7 (ref 1.2–2.2)
Albumin: 4.3 g/dL (ref 3.5–4.7)
Alkaline Phosphatase: 88 IU/L (ref 39–117)
BUN/Creatinine Ratio: 15 (ref 12–28)
BUN: 12 mg/dL (ref 8–27)
Bilirubin Total: 1.1 mg/dL (ref 0.0–1.2)
CALCIUM: 9.8 mg/dL (ref 8.7–10.3)
CHLORIDE: 101 mmol/L (ref 96–106)
CO2: 25 mmol/L (ref 20–29)
Creatinine, Ser: 0.82 mg/dL (ref 0.57–1.00)
GFR calc Af Amer: 76 mL/min/{1.73_m2} (ref >59)
GFR calc non Af Amer: 66 mL/min/{1.73_m2} (ref >59)
GLUCOSE: 89 mg/dL (ref 65–99)
Globulin, Total: 2.6 g/dL (ref 1.5–4.5)
POTASSIUM: 3.3 mmol/L — AB (ref 3.5–5.2)
SODIUM: 144 mmol/L (ref 134–144)
Total Protein: 6.9 g/dL (ref 6.0–8.5)

## 2018-04-04 LAB — LIPID PANEL
CHOLESTEROL TOTAL: 147 mg/dL (ref 100–199)
Chol/HDL Ratio: 2.5 ratio (ref 0.0–4.4)
HDL: 59 mg/dL (ref >39)
LDL Calculated: 70 mg/dL (ref 0–99)
Triglycerides: 90 mg/dL (ref 0–149)
VLDL Cholesterol Cal: 18 mg/dL (ref 5–40)

## 2018-04-04 NOTE — Telephone Encounter (Signed)
-----   Message from Mar Daring, PA-C sent at 04/04/2018  8:33 AM EDT ----- All labs are within normal limits and stable.  Thanks! -JB

## 2018-04-04 NOTE — Telephone Encounter (Signed)
Patient advised as below.  

## 2018-04-09 ENCOUNTER — Other Ambulatory Visit: Payer: Self-pay

## 2018-05-03 ENCOUNTER — Other Ambulatory Visit: Payer: Self-pay | Admitting: Physician Assistant

## 2018-05-03 DIAGNOSIS — J209 Acute bronchitis, unspecified: Secondary | ICD-10-CM

## 2018-05-07 ENCOUNTER — Encounter: Payer: Self-pay | Admitting: Physician Assistant

## 2018-05-07 ENCOUNTER — Ambulatory Visit (INDEPENDENT_AMBULATORY_CARE_PROVIDER_SITE_OTHER): Payer: PPO | Admitting: Physician Assistant

## 2018-05-07 VITALS — BP 120/80 | HR 68 | Temp 98.2°F | Resp 20 | Wt 156.0 lb

## 2018-05-07 DIAGNOSIS — J441 Chronic obstructive pulmonary disease with (acute) exacerbation: Secondary | ICD-10-CM

## 2018-05-07 MED ORDER — ALBUTEROL SULFATE HFA 108 (90 BASE) MCG/ACT IN AERS: 2.0000 | INHALATION_SPRAY | Freq: Four times a day (QID) | RESPIRATORY_TRACT | 0 refills | Status: DC | PRN

## 2018-05-07 MED ORDER — DOXYCYCLINE HYCLATE 100 MG PO TABS: 100.0000 mg | ORAL_TABLET | Freq: Two times a day (BID) | ORAL | 0 refills | Status: DC

## 2018-05-07 MED ORDER — PREDNISONE 10 MG (21) PO TBPK: ORAL_TABLET | ORAL | 0 refills | Status: DC

## 2018-05-07 NOTE — Patient Instructions (Signed)
Chronic Obstructive Pulmonary Disease Exacerbation  Chronic obstructive pulmonary disease (COPD) is a common lung problem. In COPD, the flow of air from the lungs is limited. COPD exacerbations are times that breathing gets worse and you need extra treatment. Without treatment they can be life threatening. If they happen often, your lungs can become more damaged. If your COPD gets worse, your doctor may treat you with:  ? Medicines.  ? Oxygen.  ? Different ways to clear your airway, such as using a mask.    Follow these instructions at home:  ? Do not smoke.  ? Avoid tobacco smoke and other things that bother your lungs.  ? If given, take your antibiotic medicine as told. Finish the medicine even if you start to feel better.  ? Only take medicines as told by your doctor.  ? Drink enough fluids to keep your pee (urine) clear or pale yellow (unless your doctor has told you not to).  ? Use a cool mist machine (vaporizer).  ? If you use oxygen or a machine that turns liquid medicine into a mist (nebulizer), continue to use them as told.  ? Keep up with shots (vaccinations) as told by your doctor.  ? Exercise regularly.  ? Eat healthy foods.  ? Keep all doctor visits as told.  Get help right away if:  ? You are very short of breath and it gets worse.  ? You have trouble talking.  ? You have bad chest pain.  ? You have blood in your spit (sputum).  ? You have a fever.  ? You keep throwing up (vomiting).  ? You feel weak, or you pass out (faint).  ? You feel confused.  ? You keep getting worse.  This information is not intended to replace advice given to you by your health care provider. Make sure you discuss any questions you have with your health care provider.  Document Released: 06/16/2011 Document Revised: 12/03/2015 Document Reviewed: 03/01/2013  Elsevier Interactive Patient Education ? 2017 Elsevier Inc[employer]

## 2018-05-07 NOTE — Progress Notes (Signed)
[name] Female    DOB: Mar 13, 1931   84 y.o.   MRN: [id number] Visit Date: 05/09/2018  Today's Provider: Trinna Post, PA-C   Chief Complaint  Patient presents with  . Cough   Subjective:    HPI   Cough: Patient with history of COPD complains of productive cough.  Symptoms began 1 week ago.  The cough is productive of green/yellow sputum, with wheezing and is aggravated by  activity Associated symptoms include:shortness of breath, sputum production and wheezing. Patient does not have new pets. Patient does not have a history of asthma. Patient does have a history of environmental allergens. Patient has not recent travel. Patient does not have a history of smoking. Patient  has previous Chest X-ray. Patient has not had a PPD done. Has been using Symbicort BID, and nebulizer every 4 hours.  Wt Readings from Last 3 Encounters:  05/07/18 156 lb (70.8 kg)  04/03/18 154 lb (69.9 kg)  08/21/17 153 lb (69.4 kg)       No Known Allergies   Current Outpatient Medications:  .  amLODipine (NORVASC) 5 MG tablet, Take 1 tablet (5 mg total) by mouth daily., Disp: 90 tablet, Rfl: 3 .  aspirin 81 MG tablet, Take 81 mg by mouth daily., Disp: , Rfl:  .  benazepril-hydrochlorthiazide (LOTENSIN HCT) 20-12.5 MG tablet, Take 1 tablet by mouth 2 (two) times daily., Disp: 180 tablet, Rfl: 3 .  budesonide-formoterol (SYMBICORT) 160-4.5 MCG/ACT inhaler, Inhale 2 puffs into the lungs 2 (two) times daily., Disp: 3 Inhaler, Rfl: 3 .  CALCIUM CARBONATE-VIT D-MIN PO, Take 1 tablet by mouth daily. , Disp: , Rfl:  .  fluticasone (FLONASE) 50 MCG/ACT nasal spray, Place 2 sprays into both nostrils daily. (Patient taking differently: Place 2 sprays into both nostrils daily. ), Disp: 16 g, Rfl: 6 .  furosemide (LASIX) 20 MG tablet, Take 1 tablet (20 mg total) by mouth daily. (Patient taking differently: Take 20 mg by mouth as needed. ), Disp: 90 tablet, Rfl: 3 .  L-Lysine 500 MG CAPS,  Take by mouth. Reported on 10/15/2015, Disp: , Rfl:  .  Menthol-Methyl Salicylate (MUSCLE RUB EX), Apply 1 application topically daily as needed. , Disp: , Rfl:  .  metoprolol succinate (TOPROL-XL) 50 MG 24 hr tablet, Take 1 tablet (50 mg total) by mouth daily. Take with or immediately following a meal., Disp: 90 tablet, Rfl: 3 .  Misc Natural Products (OSTEO BI-FLEX ADV JOINT SHIELD) TABS, Take by mouth., Disp: , Rfl:  .  Multiple Vitamin (MULTIVITAMIN) capsule, Take 1 capsule by mouth daily., Disp: , Rfl:  .  Omega-3 Fatty Acids (FISH OIL) 1000 MG CAPS, Take 1 capsule by mouth daily., Disp: , Rfl:  .  potassium chloride SA (K-DUR,KLOR-CON) 20 MEQ tablet, Take 1 tablet (20 mEq total) by mouth as needed., Disp: 90 tablet, Rfl: 3 .  simvastatin (ZOCOR) 20 MG tablet, Take 1 tablet (20 mg total) by mouth at bedtime., Disp: 90 tablet, Rfl: 3 .  Spacer/Aero-Holding Chambers (AEROCHAMBER MINI CHAMBER) DEVI, Use spacer with inhalers., Disp: 1 Device, Rfl: 0 .  vitamin E 100 UNIT capsule, Take 100 Units by mouth daily., Disp: , Rfl:  .  albuterol (PROVENTIL HFA;VENTOLIN HFA) 108 (90 Base) MCG/ACT inhaler, Inhale 2 puffs into the lungs every 6 (six) hours as needed for wheezing or shortness of breath., Disp: 1 Inhaler, Rfl: 0 .  doxycycline (VIBRA-TABS) 100 MG tablet, Take 1 tablet (100 mg  total) by mouth 2 (two) times daily for 7 days., Disp: 14 tablet, Rfl: 0 .  predniSONE (STERAPRED UNI-PAK 21 TAB) 10 MG (21) TBPK tablet, Take 6 pills on day 1, 5 pills on day 2, and so on until complete., Disp: 21 tablet, Rfl: 0  Review of Systems  Respiratory: Positive for cough, shortness of breath and wheezing.     Social History   Tobacco Use  . Smoking status: Never Smoker  . Smokeless tobacco: Never Used  Substance Use Topics  . Alcohol use: No    Alcohol/week: 0.0 standard drinks   Objective:   BP 120/80 (BP Location: Left Arm, Patient Position: Sitting, Cuff Size: Normal)   Pulse 68   Temp 98.2 F (36.8  C) (Oral)   Resp 20   Wt 156 lb (70.8 kg)   SpO2 95%   BMI 25.96 kg/m  Vitals:   05/07/18 1319  BP: 120/80  Pulse: 68  Resp: 20  Temp: 98.2 F (36.8 C)  TempSrc: Oral  SpO2: 95%  Weight: 156 lb (70.8 kg)     Physical Exam  Constitutional: She is oriented to person, place, and time. She appears well-developed and well-nourished.  Cardiovascular: Normal rate and regular rhythm.  Pulmonary/Chest: Effort normal. She has wheezes. She has no rales.  Moderate wheezing and rhonchi diffusely, rhonchi improve without coughing.  Neurological: She is alert and oriented to person, place, and time.  Skin: Skin is warm and dry.  Psychiatric: She has a normal mood and affect. Her behavior is normal.        Assessment & Plan:     1. COPD exacerbation (HCC)  - predniSONE (STERAPRED UNI-PAK 21 TAB) 10 MG (21) TBPK tablet; Take 6 pills on day 1, 5 pills on day 2, and so on until complete.  Dispense: 21 tablet; Refill: 0 - doxycycline (VIBRA-TABS) 100 MG tablet; Take 1 tablet (100 mg total) by mouth 2 (two) times daily for 7 days.  Dispense: 14 tablet; Refill: 0 - albuterol (PROVENTIL HFA;VENTOLIN HFA) 108 (90 Base) MCG/ACT inhaler; Inhale 2 puffs into the lungs every 6 (six) hours as needed for wheezing or shortness of breath.  Dispense: 1 Inhaler; Refill: 0  Return if symptoms worsen or fail to improve.  The entirety of the information documented in the History of Present Illness, Review of Systems and Physical Exam were personally obtained by me. Portions of this information were initially documented by Lynford Humphrey, CMA and reviewed by me for thoroughness and accuracy.        Trinna Post, PA-C  [facility]

## 2018-05-13 ENCOUNTER — Other Ambulatory Visit: Payer: Self-pay | Admitting: Physician Assistant

## 2018-05-13 DIAGNOSIS — J441 Chronic obstructive pulmonary disease with (acute) exacerbation: Secondary | ICD-10-CM

## 2018-05-14 ENCOUNTER — Encounter: Payer: Self-pay | Admitting: Family Medicine

## 2018-05-14 ENCOUNTER — Ambulatory Visit
Admission: RE | Admit: 2018-05-14 | Discharge: 2018-05-14 | Disposition: A | Discharge status: Home / Self Care | Payer: PPO | Source: Ambulatory Visit | Attending: Family Medicine | Admitting: Family Medicine

## 2018-05-14 ENCOUNTER — Other Ambulatory Visit: Payer: Self-pay

## 2018-05-14 ENCOUNTER — Ambulatory Visit (INDEPENDENT_AMBULATORY_CARE_PROVIDER_SITE_OTHER): Payer: PPO | Admitting: Family Medicine

## 2018-05-14 VITALS — BP 137/80 | HR 72 | Temp 98.2°F | Ht 65.0 in | Wt 162.6 lb

## 2018-05-14 DIAGNOSIS — I517 Cardiomegaly: Secondary | ICD-10-CM | POA: Diagnosis not present

## 2018-05-14 DIAGNOSIS — R0602 Shortness of breath: Secondary | ICD-10-CM | POA: Diagnosis not present

## 2018-05-14 DIAGNOSIS — J90 Pleural effusion, not elsewhere classified: Secondary | ICD-10-CM | POA: Diagnosis not present

## 2018-05-14 DIAGNOSIS — R05 Cough: Secondary | ICD-10-CM | POA: Diagnosis present

## 2018-05-14 DIAGNOSIS — R0989 Other specified symptoms and signs involving the circulatory and respiratory systems: Secondary | ICD-10-CM

## 2018-05-14 DIAGNOSIS — J441 Chronic obstructive pulmonary disease with (acute) exacerbation: Secondary | ICD-10-CM

## 2018-05-14 DIAGNOSIS — J9811 Atelectasis: Secondary | ICD-10-CM | POA: Insufficient documentation

## 2018-05-14 MED ORDER — PREDNISONE 10 MG (21) PO TBPK: ORAL_TABLET | ORAL | 0 refills | Status: DC

## 2018-05-14 MED ORDER — GUAIFENESIN-CODEINE 100-10 MG/5ML PO SYRP: 5.0000 mL | ORAL_SOLUTION | Freq: Three times a day (TID) | ORAL | 0 refills | Status: DC | PRN

## 2018-05-14 MED ORDER — LEVOFLOXACIN 500 MG PO TABS: 500.0000 mg | ORAL_TABLET | Freq: Every day | ORAL | 0 refills | Status: DC

## 2018-05-14 MED ORDER — IPRATROPIUM-ALBUTEROL 0.5-2.5 (3) MG/3ML IN SOLN
3.0000 mL | Freq: Four times a day (QID) | RESPIRATORY_TRACT | 0 refills | Status: DC | PRN
Start: 2018-05-14 — End: 2019-09-06

## 2018-05-14 NOTE — Progress Notes (Signed)
[name] Female    DOB: [birth year]   84 y.o.   MRN: [id number] Visit Date: 05/14/2018  Today's Provider: Vernie Murders, PA   Chief Complaint  Patient presents with  . Cough    congestion and some wheezing    Subjective:    HPI  Pt reports that she was seen a week ago today on 05/07/18 and was given pred taper and abx and she is still not better, she is having cough and congestion with some green in mucus and clear at times.     Past Medical History:  Diagnosis Date  . Arthritis   . CAD (coronary artery disease)   . CHF (congestive heart failure) (HCC)    diastolic heart failure  . Depression   . Heart attack ([place]) 1994  . Hypertension    Past Surgical History:  Procedure Laterality Date  . CORONARY ARTERY BYPASS GRAFT  2001   Family History  Problem Relation Age of Onset  . Heart attack Father   . Breast cancer Maternal Aunt    No Known Allergies  Current Outpatient Medications:  .  albuterol (PROVENTIL HFA;VENTOLIN HFA) 108 (90 Base) MCG/ACT inhaler, Inhale 2 puffs into the lungs every 6 (six) hours as needed for wheezing or shortness of breath., Disp: 1 Inhaler, Rfl: 0 .  amLODipine (NORVASC) 5 MG tablet, Take 1 tablet (5 mg total) by mouth daily., Disp: 90 tablet, Rfl: 3 .  aspirin 81 MG tablet, Take 81 mg by mouth daily., Disp: , Rfl:  .  benazepril-hydrochlorthiazide (LOTENSIN HCT) 20-12.5 MG tablet, Take 1 tablet by mouth 2 (two) times daily., Disp: 180 tablet, Rfl: 3 .  budesonide-formoterol (SYMBICORT) 160-4.5 MCG/ACT inhaler, Inhale 2 puffs into the lungs 2 (two) times daily., Disp: 3 Inhaler, Rfl: 3 .  CALCIUM CARBONATE-VIT D-MIN PO, Take 1 tablet by mouth daily. , Disp: , Rfl:  .  doxycycline (VIBRA-TABS) 100 MG tablet, Take 1 tablet (100 mg total) by mouth 2 (two) times daily for 7 days., Disp: 14 tablet, Rfl: 0 .  fluticasone (FLONASE) 50 MCG/ACT nasal spray, Place 2 sprays into both nostrils daily. (Patient taking differently:  Place 2 sprays into both nostrils daily. ), Disp: 16 g, Rfl: 6 .  furosemide (LASIX) 20 MG tablet, Take 1 tablet (20 mg total) by mouth daily. (Patient taking differently: Take 20 mg by mouth as needed. ), Disp: 90 tablet, Rfl: 3 .  L-Lysine 500 MG CAPS, Take by mouth. Reported on 10/15/2015, Disp: , Rfl:  .  Menthol-Methyl Salicylate (MUSCLE RUB EX), Apply 1 application topically daily as needed. , Disp: , Rfl:  .  metoprolol succinate (TOPROL-XL) 50 MG 24 hr tablet, Take 1 tablet (50 mg total) by mouth daily. Take with or immediately following a meal., Disp: 90 tablet, Rfl: 3 .  Misc Natural Products (OSTEO BI-FLEX ADV JOINT SHIELD) TABS, Take by mouth., Disp: , Rfl:  .  Multiple Vitamin (MULTIVITAMIN) capsule, Take 1 capsule by mouth daily., Disp: , Rfl:  .  Omega-3 Fatty Acids (FISH OIL) 1000 MG CAPS, Take 1 capsule by mouth daily., Disp: , Rfl:  .  potassium chloride SA (K-DUR,KLOR-CON) 20 MEQ tablet, Take 1 tablet (20 mEq total) by mouth as needed., Disp: 90 tablet, Rfl: 3 .  predniSONE (STERAPRED UNI-PAK 21 TAB) 10 MG (21) TBPK tablet, Take 6 pills on day 1, 5 pills on day 2, and so on until complete., Disp: 21 tablet, Rfl: 0 .  simvastatin (  ZOCOR) 20 MG tablet, Take 1 tablet (20 mg total) by mouth at bedtime., Disp: 90 tablet, Rfl: 3 .  Spacer/Aero-Holding Chambers (AEROCHAMBER MINI CHAMBER) DEVI, Use spacer with inhalers., Disp: 1 Device, Rfl: 0 .  vitamin E 100 UNIT capsule, Take 100 Units by mouth daily., Disp: , Rfl:   Review of Systems  Constitutional: Positive for chills. Negative for activity change, appetite change, diaphoresis, fatigue, fever and unexpected weight change.  HENT: Negative.   Eyes: Negative.   Respiratory: Positive for cough, shortness of breath and wheezing. Negative for apnea, choking, chest tightness and stridor.   Cardiovascular: Negative.   Gastrointestinal: Negative.   Endocrine: Negative.   Genitourinary: Negative.   Musculoskeletal: Negative.   Skin:  Negative.   Allergic/Immunologic: Negative.   Neurological: Positive for headaches. Negative for dizziness, tremors, seizures, syncope, facial asymmetry, speech difficulty, weakness, light-headedness and numbness.  Hematological: Negative.   Psychiatric/Behavioral: Negative.    Social History   Tobacco Use  . Smoking status: Never Smoker  . Smokeless tobacco: Never Used  Substance Use Topics  . Alcohol use: No    Alcohol/week: 0.0 standard drinks   Objective:   BP 137/80 (BP Location: Right Arm, Patient Position: Sitting, Cuff Size: Normal)   Pulse 72   Temp 98.2 F (36.8 C) (Oral)   Ht 5\' 5"  (1.651 m)   Wt 162 lb 9.6 oz (73.8 kg)   SpO2 94%   BMI 27.06 kg/m  Vitals:   05/14/18 1337  BP: 137/80  Pulse: 72  Temp: 98.2 F (36.8 C)  TempSrc: Oral  SpO2: 94%  Weight: 162 lb 9.6 oz (73.8 kg)  Height: 5\' 5"  (1.651 m)   Physical Exam  Constitutional: She is oriented to person, place, and time. She appears well-developed and well-nourished. No distress.  HENT:  Head: Normocephalic and atraumatic.  Right Ear: Hearing normal.  Left Ear: Hearing normal.  Nose: Nose normal.  Eyes: Conjunctivae and lids are normal. Right eye exhibits no discharge. Left eye exhibits no discharge. No scleral icterus.  Neck: Neck supple.  Cardiovascular: Normal rate and regular rhythm.  Pulmonary/Chest: Effort normal. No respiratory distress. She has wheezes. She has rales.  Abdominal: Soft. Bowel sounds are normal.  Lymphadenopathy:    She has no cervical adenopathy.  Neurological: She is alert and oriented to person, place, and time.  Skin: Skin is intact. No lesion and no rash noted.  Psychiatric: She has a normal mood and affect. Her speech is normal and behavior is normal. Thought content normal.      Assessment & Plan:     1. Rales Rales in left posterior base without fever but having some chills. Feels cough and congestion slightly better. Will change antibiotic, check CBC with diff,  CMP and get CXR to rule out pneumonia. - CBC with Differential/Platelet - Comprehensive metabolic panel - DG Chest 2 View - predniSONE (STERAPRED UNI-PAK 21 TAB) 10 MG (21) TBPK tablet; Take 6 pills on day 1, 5 pills on day 2, and so on until complete.  Dispense: 21 tablet; Refill: 0 - levofloxacin (LEVAQUIN) 500 MG tablet; Take 1 tablet (500 mg total) by mouth daily.  Dispense: 7 tablet; Refill: 0 - guaiFENesin-codeine (ROBITUSSIN AC) 100-10 MG/5ML syrup; Take 5 mLs by mouth 3 (three) times daily as needed for cough.  Dispense: 120 mL; Refill: 0 - ipratropium-albuterol (DUONEB) 0.5-2.5 (3) MG/3ML SOLN; Take 3 mLs by nebulization every 6 (six) hours as needed.  Dispense: 360 mL; Refill: 0  2. COPD exacerbation (  HCC) Dyspnea and persistent cough. Only slight change with first prednisone taper and Doxycycline. Will repeat taper, change to Levofloxacin, refill Robitussin-AC and switch albuterol in nebulizer to Duoneb q 6 hrs prn wheeze/dyspnea. Increase fluid intake and follow up pending lab reports. - predniSONE (STERAPRED UNI-PAK 21 TAB) 10 MG (21) TBPK tablet; Take 6 pills on day 1, 5 pills on day 2, and so on until complete.  Dispense: 21 tablet; Refill: 0 - levofloxacin (LEVAQUIN) 500 MG tablet; Take 1 tablet (500 mg total) by mouth daily.  Dispense: 7 tablet; Refill: 0 - guaiFENesin-codeine (ROBITUSSIN AC) 100-10 MG/5ML syrup; Take 5 mLs by mouth 3 (three) times daily as needed for cough.  Dispense: 120 mL; Refill: 0 - ipratropium-albuterol (DUONEB) 0.5-2.5 (3) MG/3ML SOLN; Take 3 mLs by nebulization every 6 (six) hours as needed.  Dispense: 360 mL; Refill: 0       Vernie Murders, PA  [facility]

## 2018-05-15 LAB — CBC WITH DIFFERENTIAL/PLATELET
BASOS: 1 %
Basophils Absolute: 0.1 10*3/uL (ref 0.0–0.2)
EOS (ABSOLUTE): 0.4 10*3/uL (ref 0.0–0.4)
Eos: 3 %
HEMATOCRIT: 42.3 % (ref 34.0–46.6)
HEMOGLOBIN: 15.1 g/dL (ref 11.1–15.9)
Immature Grans (Abs): 0 10*3/uL (ref 0.0–0.1)
Immature Granulocytes: 0 %
Lymphocytes Absolute: 2.3 10*3/uL (ref 0.7–3.1)
Lymphs: 18 %
MCH: 31.3 pg (ref 26.6–33.0)
MCHC: 35.7 g/dL (ref 31.5–35.7)
MCV: 88 fL (ref 79–97)
MONOCYTES: 9 %
Monocytes Absolute: 1.1 10*3/uL — ABNORMAL HIGH (ref 0.1–0.9)
NEUTROS ABS: 9.1 10*3/uL — AB (ref 1.4–7.0)
Neutrophils: 69 %
Platelets: 173 10*3/uL (ref 150–450)
RBC: 4.83 x10E6/uL (ref 3.77–5.28)
RDW: 12.6 % (ref 12.3–15.4)
WBC: 13 10*3/uL — ABNORMAL HIGH (ref 3.4–10.8)

## 2018-05-15 LAB — COMPREHENSIVE METABOLIC PANEL
ALT: 34 IU/L — ABNORMAL HIGH (ref 0–32)
AST: 28 IU/L (ref 0–40)
Albumin/Globulin Ratio: 1.4 (ref 1.2–2.2)
Albumin: 4 g/dL (ref 3.5–4.7)
Alkaline Phosphatase: 106 IU/L (ref 39–117)
BILIRUBIN TOTAL: 1.7 mg/dL — AB (ref 0.0–1.2)
BUN / CREAT RATIO: 14 (ref 12–28)
BUN: 13 mg/dL (ref 8–27)
CHLORIDE: 99 mmol/L (ref 96–106)
CO2: 27 mmol/L (ref 20–29)
Calcium: 9.7 mg/dL (ref 8.7–10.3)
Creatinine, Ser: 0.91 mg/dL (ref 0.57–1.00)
GFR calc Af Amer: 67 mL/min/{1.73_m2} (ref >59)
GFR calc non Af Amer: 58 mL/min/{1.73_m2} — ABNORMAL LOW (ref >59)
GLOBULIN, TOTAL: 2.8 g/dL (ref 1.5–4.5)
Glucose: 98 mg/dL (ref 65–99)
Potassium: 3.6 mmol/L (ref 3.5–5.2)
SODIUM: 143 mmol/L (ref 134–144)
Total Protein: 6.8 g/dL (ref 6.0–8.5)

## 2018-05-24 ENCOUNTER — Encounter: Payer: Self-pay | Admitting: Family Medicine

## 2018-05-24 ENCOUNTER — Ambulatory Visit (INDEPENDENT_AMBULATORY_CARE_PROVIDER_SITE_OTHER): Payer: PPO | Admitting: Family Medicine

## 2018-05-24 VITALS — BP 102/66 | HR 57 | Temp 97.5°F | Resp 15 | Wt 154.0 lb

## 2018-05-24 DIAGNOSIS — J440 Chronic obstructive pulmonary disease with acute lower respiratory infection: Secondary | ICD-10-CM

## 2018-05-24 DIAGNOSIS — I5032 Chronic diastolic (congestive) heart failure: Secondary | ICD-10-CM

## 2018-05-24 NOTE — Progress Notes (Signed)
[name] Female    DOB: [birth year]   84 y.o.   MRN: [id number] Visit Date: 05/24/2018  Today's Provider: Vernie Murders, PA   Chief Complaint  Patient presents with  . COPD   Subjective:    HPI     COPD Follow up Patient returns to office today for follow up from 05/14/18. At last office visit patient was started on Levaquin and Prednisone to help with symptoms of COPD exacerbation. Patient reports today that she has completed prescribed medication and upper respiratory symptoms have improved greatly. Patient states that she has been using Duoneb three times a day and Symbicort two times daily.  Past Medical History:  Diagnosis Date  . Arthritis   . CAD (coronary artery disease)   . CHF (congestive heart failure) (HCC)    diastolic heart failure  . Depression   . Heart attack ([place]) 1994  . Hypertension    Past Surgical History:  Procedure Laterality Date  . CORONARY ARTERY BYPASS GRAFT  2001   Family History  Problem Relation Age of Onset  . Heart attack Father   . Breast cancer Maternal Aunt    No Known Allergies  Current Outpatient Medications:  .  albuterol (PROVENTIL HFA;VENTOLIN HFA) 108 (90 Base) MCG/ACT inhaler, Inhale 2 puffs into the lungs every 6 (six) hours as needed for wheezing or shortness of breath., Disp: 1 Inhaler, Rfl: 0 .  amLODipine (NORVASC) 5 MG tablet, Take 1 tablet (5 mg total) by mouth daily., Disp: 90 tablet, Rfl: 3 .  aspirin 81 MG tablet, Take 81 mg by mouth daily., Disp: , Rfl:  .  benazepril-hydrochlorthiazide (LOTENSIN HCT) 20-12.5 MG tablet, Take 1 tablet by mouth 2 (two) times daily., Disp: 180 tablet, Rfl: 3 .  budesonide-formoterol (SYMBICORT) 160-4.5 MCG/ACT inhaler, Inhale 2 puffs into the lungs 2 (two) times daily., Disp: 3 Inhaler, Rfl: 3 .  CALCIUM CARBONATE-VIT D-MIN PO, Take 1 tablet by mouth daily. , Disp: , Rfl:  .  furosemide (LASIX) 20 MG tablet, Take 1 tablet (20 mg total) by mouth daily. (Patient  taking differently: Take 20 mg by mouth as needed. ), Disp: 90 tablet, Rfl: 3 .  ipratropium-albuterol (DUONEB) 0.5-2.5 (3) MG/3ML SOLN, Take 3 mLs by nebulization every 6 (six) hours as needed., Disp: 360 mL, Rfl: 0 .  L-Lysine 500 MG CAPS, Take by mouth. Reported on 10/15/2015, Disp: , Rfl:  .  Menthol-Methyl Salicylate (MUSCLE RUB EX), Apply 1 application topically daily as needed. , Disp: , Rfl:  .  metoprolol succinate (TOPROL-XL) 50 MG 24 hr tablet, Take 1 tablet (50 mg total) by mouth daily. Take with or immediately following a meal., Disp: 90 tablet, Rfl: 3 .  Misc Natural Products (OSTEO BI-FLEX ADV JOINT SHIELD) TABS, Take by mouth., Disp: , Rfl:  .  Multiple Vitamin (MULTIVITAMIN) capsule, Take 1 capsule by mouth daily., Disp: , Rfl:  .  Omega-3 Fatty Acids (FISH OIL) 1000 MG CAPS, Take 1 capsule by mouth daily., Disp: , Rfl:  .  potassium chloride SA (K-DUR,KLOR-CON) 20 MEQ tablet, Take 1 tablet (20 mEq total) by mouth as needed., Disp: 90 tablet, Rfl: 3 .  simvastatin (ZOCOR) 20 MG tablet, Take 1 tablet (20 mg total) by mouth at bedtime., Disp: 90 tablet, Rfl: 3 .  Spacer/Aero-Holding Chambers (AEROCHAMBER MINI CHAMBER) DEVI, Use spacer with inhalers., Disp: 1 Device, Rfl: 0 .  vitamin E 100 UNIT capsule, Take 100 Units by mouth daily., Disp: ,  Rfl:   Review of Systems  Constitutional: Negative.   HENT: Positive for congestion and postnasal drip.   Eyes: Negative.   Respiratory: Positive for shortness of breath.   Cardiovascular: Negative.   Gastrointestinal: Negative.   Endocrine: Negative.   Genitourinary: Negative.   Musculoskeletal: Negative.   Skin: Negative.   Allergic/Immunologic: Negative.   Neurological: Negative.   Hematological: Negative.   Psychiatric/Behavioral: Negative.    Social History   Tobacco Use  . Smoking status: Never Smoker  . Smokeless tobacco: Never Used  Substance Use Topics  . Alcohol use: No    Alcohol/week: 0.0 standard drinks    Objective:   BP 102/66   Pulse (!) 57   Temp (!) 97.5 F (36.4 C) (Oral)   Resp 15   Wt 154 lb (69.9 kg)   SpO2 98%   BMI 25.63 kg/m  Vitals:   05/24/18 1013  BP: 102/66  Pulse: (!) 57  Resp: 15  Temp: (!) 97.5 F (36.4 C)  TempSrc: Oral  SpO2: 98%  Weight: 154 lb (69.9 kg)   Physical Exam  Constitutional: She is oriented to person, place, and time. She appears well-developed and well-nourished. No distress.  HENT:  Head: Normocephalic and atraumatic.  Right Ear: Hearing normal.  Left Ear: Hearing normal.  Nose: Nose normal.  Eyes: Conjunctivae and lids are normal. Right eye exhibits no discharge. Left eye exhibits no discharge. No scleral icterus.  Cardiovascular: Normal rate and regular rhythm.  Pulmonary/Chest: Effort normal and breath sounds normal. No respiratory distress.  Musculoskeletal: Normal range of motion. She exhibits no edema.  Neurological: She is alert and oriented to person, place, and time.  Skin: Skin is intact. No lesion and no rash noted.  Psychiatric: She has a normal mood and affect. Her speech is normal and behavior is normal. Thought content normal.       Assessment & Plan:     1. Chronic obstructive pulmonary disease with acute lower respiratory infection (HCC) Follow up of acute exacerbation with lower lung rales. Has finished the prednisone taper and Levaquin. No significant cough, fever or sputum production today. Feeling much better. CXR on 05-14-18 showed cardiomegaly with mild interstitial pulmonary edema, right pleural effusion and bibasilar atelectasis. No dyspnea today and no rales or wheezes (lungs clear). May continue Symbicort 160-4.5 mg/act 2 puffs BID and Duoneb by nebulizer 1-2 times a day (may increase to QID as wheezing/dyspnea dictates). Recheck prn.  2. Chronic diastolic heart failure ([place]) Peripheral edema gone today with the increase in Lasix use. Recommend continue 20 mg 1-2 times a week to prevent volume overload. Check  weight daily and notify us if gaining 5 lbs in 24 hours (daughter was a pulmonary nurse and will monitor her condition to notify us of changes). Recheck in 4 months prn.       Vernie Murders, PA  [facility]

## 2018-12-05 IMAGING — CR DG CHEST 2V
1 series · 2 of 2 positions shown · non-contrast
Comparison: June 22, 2015

CLINICAL DATA: Shortness of breath and decreased oxygen saturation

EXAM:
CHEST  2 VIEW

[Series 1: dg chest 2 view · 0.14mm/px · 2 of 2 slices shown]
[im 1/2]
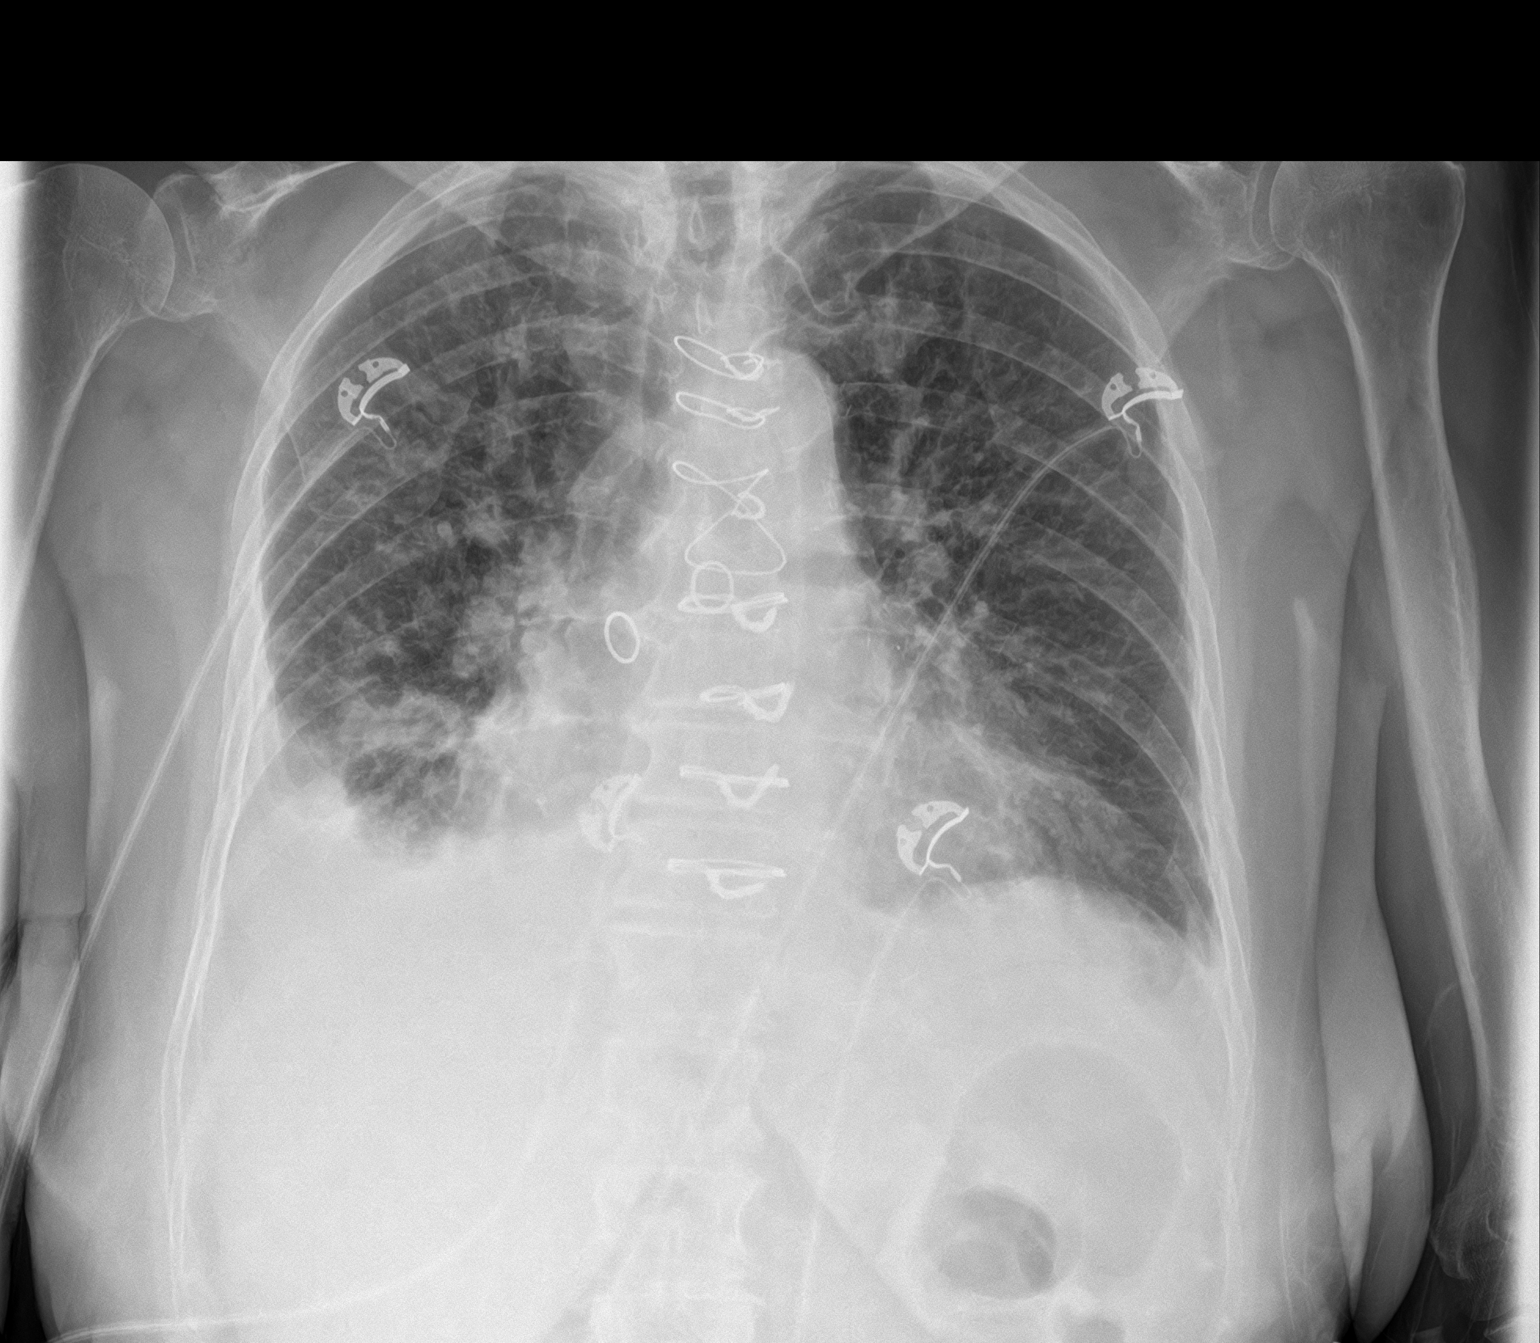
[im 2/2]
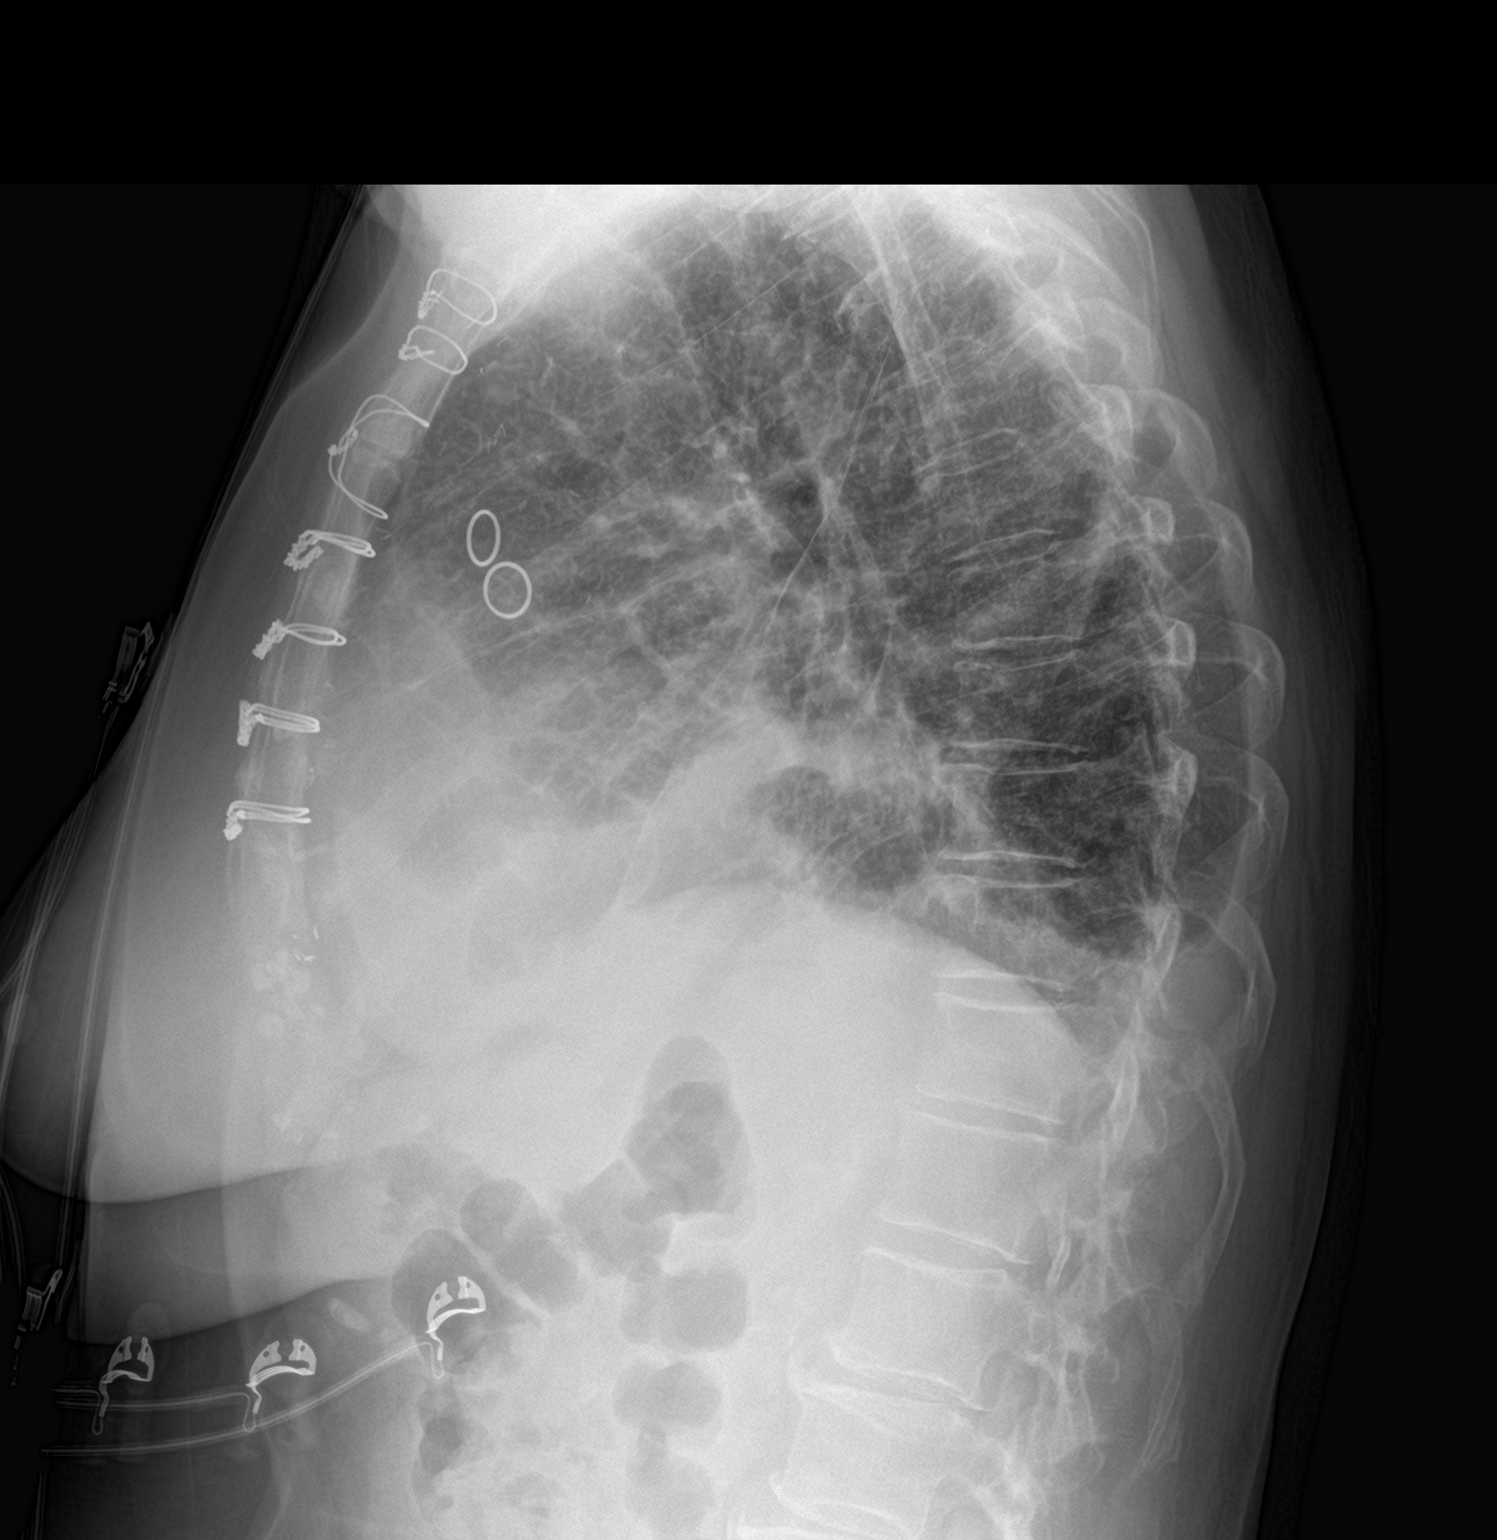

[2 of 2 positions shown; findings below may reference images not displayed]

FINDINGS: Patient is status post coronary artery bypass grafting. Heart size
is upper normal. Pulmonary vascularity is normal.

There is consolidation in the right base with small right pleural
effusion. There is underlying mild interstitial edema. There is
aortic atherosclerosis. No adenopathy. No bone lesions.
IMPRESSION: Consolidation right base with small right pleural effusion. Suspect
pneumonia. There is mild interstitial prominence which may represent
mild interstitial edema. Heart upper normal in size.

There is aortic atherosclerosis.

Aortic Atherosclerosis (S2RI4-I22.2).

## 2019-01-22 ENCOUNTER — Telehealth: Payer: Self-pay | Admitting: Physician Assistant

## 2019-01-22 NOTE — Telephone Encounter (Signed)
Pt is scheduled for OV tomorrow and her daughter is concerned that pt isn't being as proactive with pt protecting herself in public and social gatherings. Pt's daughter is requesting Amanda Strickland to bring up how important it is for pt to social distance and protect herself from Covid. Thanks TNP

## 2019-01-23 ENCOUNTER — Other Ambulatory Visit: Payer: Self-pay

## 2019-01-23 ENCOUNTER — Ambulatory Visit (INDEPENDENT_AMBULATORY_CARE_PROVIDER_SITE_OTHER): Payer: PPO | Admitting: Physician Assistant

## 2019-01-23 ENCOUNTER — Encounter: Payer: Self-pay | Admitting: Physician Assistant

## 2019-01-23 VITALS — BP 133/66 | HR 62 | Temp 98.1°F | Resp 16 | Ht 65.0 in | Wt 156.0 lb

## 2019-01-23 DIAGNOSIS — N183 Chronic kidney disease, stage 3 unspecified: Secondary | ICD-10-CM

## 2019-01-23 DIAGNOSIS — I110 Hypertensive heart disease with heart failure: Secondary | ICD-10-CM | POA: Diagnosis not present

## 2019-01-23 DIAGNOSIS — E7800 Pure hypercholesterolemia, unspecified: Secondary | ICD-10-CM

## 2019-01-23 DIAGNOSIS — I5032 Chronic diastolic (congestive) heart failure: Secondary | ICD-10-CM | POA: Diagnosis not present

## 2019-01-23 DIAGNOSIS — J418 Mixed simple and mucopurulent chronic bronchitis: Secondary | ICD-10-CM | POA: Diagnosis not present

## 2019-01-23 DIAGNOSIS — I5033 Acute on chronic diastolic (congestive) heart failure: Secondary | ICD-10-CM | POA: Diagnosis not present

## 2019-01-23 DIAGNOSIS — I10 Essential (primary) hypertension: Secondary | ICD-10-CM

## 2019-01-23 NOTE — Patient Instructions (Signed)
Chronic Obstructive Pulmonary Disease °Chronic obstructive pulmonary disease (COPD) is a long-term (chronic) lung problem. When you have COPD, it is hard for air to get in and out of your lungs. Usually the condition gets worse over time, and your lungs will never return to normal. There are things you can do to keep yourself as healthy as possible. °· Your doctor may treat your condition with: °? Medicines. °? Oxygen. °? Lung surgery. °· Your doctor may also recommend: °? Rehabilitation. This includes steps to make your body work better. It may involve a team of specialists. °? Quitting smoking, if you smoke. °? Exercise and changes to your diet. °? Comfort measures (palliative care). °Follow these instructions at home: °Medicines °· Take over-the-counter and prescription medicines only as told by your doctor. °· Talk to your doctor before taking any cough or allergy medicines. You may need to avoid medicines that cause your lungs to be dry. °Lifestyle °· If you smoke, stop. Smoking makes the problem worse. If you need help quitting, ask your doctor. °· Avoid being around things that make your breathing worse. This may include smoke, chemicals, and fumes. °· Stay active, but remember to rest as well. °· Learn and use tips on how to relax. °· Make sure you get enough sleep. Most adults need at least 7 hours of sleep every night. °· Eat healthy foods. Eat smaller meals more often. Rest before meals. °Controlled breathing °Learn and use tips on how to control your breathing as told by your doctor. Try: °· Breathing in (inhaling) through your nose for 1 second. Then, pucker your lips and breath out (exhale) through your lips for 2 seconds. °· Putting one hand on your belly (abdomen). Breathe in slowly through your nose for 1 second. Your hand on your belly should move out. Pucker your lips and breathe out slowly through your lips. Your hand on your belly should move in as you breathe out. ° °Controlled coughing °Learn  and use controlled coughing to clear mucus from your lungs. Follow these steps: °1. Lean your head a little forward. °2. Breathe in deeply. °3. Try to hold your breath for 3 seconds. °4. Keep your mouth slightly open while coughing 2 times. °5. Spit any mucus out into a tissue. °6. Rest and do the steps again 1 or 2 times as needed. °General instructions °· Make sure you get all the shots (vaccines) that your doctor recommends. Ask your doctor about a flu shot and a pneumonia shot. °· Use oxygen therapy and pulmonary rehabilitation if told by your doctor. If you need home oxygen therapy, ask your doctor if you should buy a tool to measure your oxygen level (oximeter). °· Make a COPD action plan with your doctor. This helps you to know what to do if you feel worse than usual. °· Manage any other conditions you have as told by your doctor. °· Avoid going outside when it is very hot, cold, or humid. °· Avoid people who have a sickness you can catch (contagious). °· Keep all follow-up visits as told by your doctor. This is important. °Contact a doctor if: °· You cough up more mucus than usual. °· There is a change in the color or thickness of the mucus. °· It is harder to breathe than usual. °· Your breathing is faster than usual. °· You have trouble sleeping. °· You need to use your medicines more often than usual. °· You have trouble doing your normal activities such as getting dressed   or walking around the house. °Get help right away if: °· You have shortness of breath while resting. °· You have shortness of breath that stops you from: °? Being able to talk. °? Doing normal activities. °· Your chest hurts for longer than 5 minutes. °· Your skin color is more blue than usual. °· Your pulse oximeter shows that you have low oxygen for longer than 5 minutes. °· You have a fever. °· You feel too tired to breathe normally. °Summary °· Chronic obstructive pulmonary disease (COPD) is a long-term lung problem. °· The way your  lungs work will never return to normal. Usually the condition gets worse over time. There are things you can do to keep yourself as healthy as possible. °· Take over-the-counter and prescription medicines only as told by your doctor. °· If you smoke, stop. Smoking makes the problem worse. °This information is not intended to replace advice given to you by your health care provider. Make sure you discuss any questions you have with your health care provider. °Document Released: 12/14/2007 Document Revised: 06/09/2017 Document Reviewed: 08/01/2016 °Elsevier Patient Education © 2020 [employer] ° °

## 2019-01-23 NOTE — Progress Notes (Signed)
[name] Female    DOB: 12-22-1930   85 y.o.   MRN: [id number] Visit Date: 01/23/2019  Today's Provider: Mar Daring, PA-C   Chief Complaint  Patient presents with  . Follow-up  . COPD   Subjective:     HPI   Chronic obstructive pulmonary disease with acute lower respiratory infection ([place]) From 05/24/2018-seen by Vernie Murders. Continue Symbicort 160-4.5 mg/act 2 puffs BID and Duoneb by nebulizer 1-2 times a day (may increase to QID as wheezing/dyspnea dictates). Recheck prn.  Patient reports she is doing well. She has no complaints. Has not needed her ventolin rescue inhaler. Uses Duoneb twice daily just before using her spiriva inhaler. Currently feels well with no complaints.    No Known Allergies   Current Outpatient Medications:  .  albuterol (PROVENTIL HFA;VENTOLIN HFA) 108 (90 Base) MCG/ACT inhaler, Inhale 2 puffs into the lungs every 6 (six) hours as needed for wheezing or shortness of breath., Disp: 1 Inhaler, Rfl: 0 .  amLODipine (NORVASC) 5 MG tablet, Take 1 tablet (5 mg total) by mouth daily., Disp: 90 tablet, Rfl: 3 .  aspirin 81 MG tablet, Take 81 mg by mouth daily., Disp: , Rfl:  .  benazepril-hydrochlorthiazide (LOTENSIN HCT) 20-12.5 MG tablet, Take 1 tablet by mouth 2 (two) times daily., Disp: 180 tablet, Rfl: 3 .  budesonide-formoterol (SYMBICORT) 160-4.5 MCG/ACT inhaler, Inhale 2 puffs into the lungs 2 (two) times daily., Disp: 3 Inhaler, Rfl: 3 .  CALCIUM CARBONATE-VIT D-MIN PO, Take 1 tablet by mouth daily. , Disp: , Rfl:  .  furosemide (LASIX) 20 MG tablet, Take 1 tablet (20 mg total) by mouth daily. (Patient taking differently: Take 20 mg by mouth as needed. ), Disp: 90 tablet, Rfl: 3 .  ipratropium-albuterol (DUONEB) 0.5-2.5 (3) MG/3ML SOLN, Take 3 mLs by nebulization every 6 (six) hours as needed., Disp: 360 mL, Rfl: 0 .  L-Lysine 500 MG CAPS, Take by mouth. Reported on 10/15/2015, Disp: , Rfl:  .  Menthol-Methyl  Salicylate (MUSCLE RUB EX), Apply 1 application topically daily as needed. , Disp: , Rfl:  .  metoprolol succinate (TOPROL-XL) 50 MG 24 hr tablet, Take 1 tablet (50 mg total) by mouth daily. Take with or immediately following a meal., Disp: 90 tablet, Rfl: 3 .  Misc Natural Products (OSTEO BI-FLEX ADV JOINT SHIELD) TABS, Take by mouth., Disp: , Rfl:  .  Multiple Vitamin (MULTIVITAMIN) capsule, Take 1 capsule by mouth daily., Disp: , Rfl:  .  Omega-3 Fatty Acids (FISH OIL) 1000 MG CAPS, Take 1 capsule by mouth daily., Disp: , Rfl:  .  potassium chloride SA (K-DUR,KLOR-CON) 20 MEQ tablet, Take 1 tablet (20 mEq total) by mouth as needed., Disp: 90 tablet, Rfl: 3 .  simvastatin (ZOCOR) 20 MG tablet, Take 1 tablet (20 mg total) by mouth at bedtime., Disp: 90 tablet, Rfl: 3 .  Spacer/Aero-Holding Chambers (AEROCHAMBER MINI CHAMBER) DEVI, Use spacer with inhalers., Disp: 1 Device, Rfl: 0 .  vitamin E 100 UNIT capsule, Take 100 Units by mouth daily., Disp: , Rfl:   Review of Systems  Constitutional: Negative for appetite change, chills, fatigue and fever.  Respiratory: Negative for cough, chest tightness and shortness of breath.   Cardiovascular: Negative for chest pain and palpitations.  Gastrointestinal: Negative for abdominal pain, nausea and vomiting.  Neurological: Negative for dizziness and weakness.    Social History   Tobacco Use  . Smoking status: Never Smoker  . Smokeless tobacco:  Never Used  Substance Use Topics  . Alcohol use: No    Alcohol/week: 0.0 standard drinks      Objective:   BP 133/66 (BP Location: Left Arm, Patient Position: Sitting, Cuff Size: Large)   Pulse 62   Temp 98.1 F (36.7 C) (Oral)   Resp 16   Ht 5\' 5"  (1.651 m)   Wt 156 lb (70.8 kg)   SpO2 96%   BMI 25.96 kg/m  Vitals:   01/23/19 1122  BP: 133/66  Pulse: 62  Resp: 16  Temp: 98.1 F (36.7 C)  TempSrc: Oral  SpO2: 96%  Weight: 156 lb (70.8 kg)  Height: 5\' 5"  (1.651 m)     Physical Exam  Vitals signs reviewed.  Constitutional:      General: She is not in acute distress.    Appearance: She is well-developed. She is not diaphoretic.  Neck:     Musculoskeletal: Normal range of motion and neck supple.     Thyroid: No thyromegaly.     Vascular: No JVD.     Trachea: No tracheal deviation.  Cardiovascular:     Rate and Rhythm: Normal rate and regular rhythm.     Pulses: Normal pulses.     Heart sounds: Normal heart sounds. No murmur. No friction rub. No gallop.   Pulmonary:     Effort: Pulmonary effort is normal. No respiratory distress.     Breath sounds: Wheezing (faint wheezes heard in upper lobes bilaterally) present. No rales.  Musculoskeletal:     Right lower leg: Edema present.     Left lower leg: Edema present.  Lymphadenopathy:     Cervical: No cervical adenopathy.  Skin:    Capillary Refill: Capillary refill takes less than 2 seconds.  Neurological:     Mental Status: She is alert.      No results found for any visits on 01/23/19.     Assessment & Plan    1. Chronic diastolic heart failure (HCC) Still with leg swelling. Using Furosemide 20mg  daily. Will check labs as below and f/u pending results. Also discussed getting better compression stockings. Elevate legs when at rest. May consider adding second lasix tab or increasing to 40mg  pending labs.  - CBC w/Diff/Platelet - Comprehensive Metabolic Panel (CMET) - Lipid Profile  2. Hypertensive heart disease with acute on chronic diastolic congestive heart failure ([place]) See above medical treatment plan. - CBC w/Diff/Platelet - Comprehensive Metabolic Panel (CMET) - Lipid Profile  3. Essential hypertension Stable. Will check labs as below and f/u pending results. - CBC w/Diff/Platelet - Comprehensive Metabolic Panel (CMET) - Lipid Profile  4. Chronic kidney disease (CKD), stage III (moderate) (HCC) Stable. Will check labs as below and f/u pending results. - CBC w/Diff/Platelet - Comprehensive  Metabolic Panel (CMET) - Lipid Profile  5. Hypercholesteremia Stable. Will check labs as below and f/u pending results. - CBC w/Diff/Platelet - Comprehensive Metabolic Panel (CMET) - Lipid Profile  6. Mixed simple and mucopurulent chronic bronchitis (HCC) Stable. Will check labs as below and f/u pending results. - CBC w/Diff/Platelet - Comprehensive Metabolic Panel (CMET) - Lipid Profile     Mar Daring, PA-C  [facility]

## 2019-01-24 ENCOUNTER — Telehealth: Payer: Self-pay

## 2019-01-24 LAB — CBC WITH DIFFERENTIAL/PLATELET
Basophils Absolute: 0.1 10*3/uL (ref 0.0–0.2)
Basos: 1 %
EOS (ABSOLUTE): 0.3 10*3/uL (ref 0.0–0.4)
Eos: 3 %
Hematocrit: 44.7 % (ref 34.0–46.6)
Hemoglobin: 15.3 g/dL (ref 11.1–15.9)
Immature Grans (Abs): 0 10*3/uL (ref 0.0–0.1)
Immature Granulocytes: 0 %
Lymphocytes Absolute: 1.9 10*3/uL (ref 0.7–3.1)
Lymphs: 22 %
MCH: 31.7 pg (ref 26.6–33.0)
MCHC: 34.2 g/dL (ref 31.5–35.7)
MCV: 93 fL (ref 79–97)
Monocytes Absolute: 1 10*3/uL — ABNORMAL HIGH (ref 0.1–0.9)
Monocytes: 11 %
Neutrophils Absolute: 5.7 10*3/uL (ref 1.4–7.0)
Neutrophils: 63 %
Platelets: 144 10*3/uL — ABNORMAL LOW (ref 150–450)
RBC: 4.82 x10E6/uL (ref 3.77–5.28)
RDW: 12.2 % (ref 11.7–15.4)
WBC: 9 10*3/uL (ref 3.4–10.8)

## 2019-01-24 LAB — COMPREHENSIVE METABOLIC PANEL
ALT: 18 IU/L (ref 0–32)
AST: 26 IU/L (ref 0–40)
Albumin/Globulin Ratio: 2 (ref 1.2–2.2)
Albumin: 4.5 g/dL (ref 3.6–4.6)
Alkaline Phosphatase: 82 IU/L (ref 39–117)
BUN/Creatinine Ratio: 16 (ref 12–28)
BUN: 14 mg/dL (ref 8–27)
Bilirubin Total: 1.1 mg/dL (ref 0.0–1.2)
CO2: 26 mmol/L (ref 20–29)
Calcium: 9.9 mg/dL (ref 8.7–10.3)
Chloride: 99 mmol/L (ref 96–106)
Creatinine, Ser: 0.9 mg/dL (ref 0.57–1.00)
GFR calc Af Amer: 67 mL/min/{1.73_m2} (ref >59)
GFR calc non Af Amer: 58 mL/min/{1.73_m2} — ABNORMAL LOW (ref >59)
Globulin, Total: 2.3 g/dL (ref 1.5–4.5)
Glucose: 75 mg/dL (ref 65–99)
Potassium: 3.4 mmol/L — ABNORMAL LOW (ref 3.5–5.2)
Sodium: 144 mmol/L (ref 134–144)
Total Protein: 6.8 g/dL (ref 6.0–8.5)

## 2019-01-24 LAB — LIPID PANEL
Chol/HDL Ratio: 2.3 ratio (ref 0.0–4.4)
Cholesterol, Total: 147 mg/dL (ref 100–199)
HDL: 63 mg/dL (ref >39)
LDL Calculated: 59 mg/dL (ref 0–99)
Triglycerides: 127 mg/dL (ref 0–149)
VLDL Cholesterol Cal: 25 mg/dL (ref 5–40)

## 2019-01-24 NOTE — Telephone Encounter (Signed)
Patient's daughter Karena Addison who is on the DPR was advised of results.

## 2019-01-24 NOTE — Telephone Encounter (Signed)
-----   Message from Mar Daring, [place] sent at 01/24/2019  8:35 AM EDT ----- Blood count is normal. Kidney function is normal. Potassium is borderline low at 3.4, low normal is 3.5. Increase potassium rich foods in diet, such as sweet potato, banana, leafy greens like spinach and kale, broccoli, etc. Liver enzymes are normal. Cholesterol is normal.   As discussed yesterday in the office your kidney function is doing well. On days where you feel more swollen you can take two lasix (furosemide) pills. It is safe to continue taking one daily. Make sure when you take the fluid pill to also take your potassium supplement with it. On the days you feel you need that second fluid pill, take a second potassium also.   May need to call daughter with results also.

## 2019-06-04 ENCOUNTER — Other Ambulatory Visit: Payer: Self-pay | Admitting: Physician Assistant

## 2019-06-04 DIAGNOSIS — E7800 Pure hypercholesterolemia, unspecified: Secondary | ICD-10-CM

## 2019-06-04 DIAGNOSIS — I10 Essential (primary) hypertension: Secondary | ICD-10-CM

## 2019-06-04 DIAGNOSIS — J449 Chronic obstructive pulmonary disease, unspecified: Secondary | ICD-10-CM

## 2019-06-04 DIAGNOSIS — I5032 Chronic diastolic (congestive) heart failure: Secondary | ICD-10-CM

## 2019-09-06 ENCOUNTER — Other Ambulatory Visit: Payer: Self-pay | Admitting: Family Medicine

## 2019-09-06 ENCOUNTER — Other Ambulatory Visit: Payer: Self-pay | Admitting: Physician Assistant

## 2019-09-06 DIAGNOSIS — E7800 Pure hypercholesterolemia, unspecified: Secondary | ICD-10-CM

## 2019-09-06 DIAGNOSIS — I10 Essential (primary) hypertension: Secondary | ICD-10-CM

## 2019-09-06 DIAGNOSIS — R0989 Other specified symptoms and signs involving the circulatory and respiratory systems: Secondary | ICD-10-CM

## 2019-09-06 DIAGNOSIS — J441 Chronic obstructive pulmonary disease with (acute) exacerbation: Secondary | ICD-10-CM

## 2019-09-06 NOTE — Telephone Encounter (Signed)
Requested medication (s) are due for refill today: Yes  Requested medication (s) are on the active medication list: Yes  Last refill:  05/14/18  Future visit scheduled: No  Notes to clinic:  Prescription has expired.    Requested Prescriptions  Pending Prescriptions Disp Refills   ipratropium-albuterol (DUONEB) 0.5-2.5 (3) MG/3ML SOLN [Pharmacy Med Name: ipratropium 0.5 mg-albuterol 3 mg (2.5 mg base)/3 mL nebulization soln] 360 mL 0    Sig: USE 1 VIAL VIA NEBULIZER AND INHALE BY MOUTH EVERY 6 HOURS AS NEEDED      Pulmonology:  Combination Products Passed - 09/06/2019 10:46 AM      Passed - Valid encounter within last 12 months    Recent Outpatient Visits           7 months ago Chronic diastolic heart failure [facility])   [place], PA-C   1 year ago Chronic obstructive pulmonary disease with acute lower respiratory infection [facility])   [place], Vickki Muff, [place]   1 year ago [employer], [place], [place]   1 year ago COPD exacerbation [facility])   [facility], Wendee Bea[place], PA-C   1 year ago Medicare annual wellness visit, subsequent   [place], [place], [place]

## 2019-09-06 NOTE — Telephone Encounter (Deleted)
Refill requests  for simvastatin 20 mg and benazepril - hydrochlorothiazide 20-12.5 mg. Both prescriptions expired on

## 2019-09-06 NOTE — Telephone Encounter (Signed)
Error

## 2019-09-12 ENCOUNTER — Other Ambulatory Visit: Payer: Self-pay | Admitting: Physician Assistant

## 2019-09-12 DIAGNOSIS — I10 Essential (primary) hypertension: Secondary | ICD-10-CM

## 2019-09-12 NOTE — Telephone Encounter (Signed)
Courtesy refill given. Message left to make an appointment.

## 2019-09-16 ENCOUNTER — Other Ambulatory Visit: Payer: Self-pay | Admitting: Physician Assistant

## 2019-09-16 DIAGNOSIS — I10 Essential (primary) hypertension: Secondary | ICD-10-CM

## 2019-09-16 MED ORDER — BENAZEPRIL-HYDROCHLOROTHIAZIDE 20-12.5 MG PO TABS: 1.0000 | ORAL_TABLET | Freq: Two times a day (BID) | ORAL | 0 refills | Status: DC

## 2019-09-16 MED ORDER — AMLODIPINE BESYLATE 5 MG PO TABS: 5.0000 mg | ORAL_TABLET | Freq: Every day | ORAL | 0 refills | Status: DC

## 2019-09-16 NOTE — Telephone Encounter (Addendum)
[facility] faxed refill request for the following medications:  benazepril-hydrochlorthiazide (LOTENSIN HCT) 20-12.5 MG tablet amLODipine (NORVASC) 5 MG tablet   Please advise.  Thanks, [employer]

## 2019-09-20 ENCOUNTER — Other Ambulatory Visit: Payer: Self-pay

## 2019-09-20 ENCOUNTER — Encounter: Payer: Self-pay | Admitting: Physician Assistant

## 2019-09-20 ENCOUNTER — Ambulatory Visit (INDEPENDENT_AMBULATORY_CARE_PROVIDER_SITE_OTHER): Payer: PPO | Admitting: Physician Assistant

## 2019-09-20 VITALS — BP 150/86 | HR 68 | Temp 96.9°F | Resp 16 | Ht 64.0 in | Wt 151.8 lb

## 2019-09-20 DIAGNOSIS — R739 Hyperglycemia, unspecified: Secondary | ICD-10-CM

## 2019-09-20 DIAGNOSIS — I10 Essential (primary) hypertension: Secondary | ICD-10-CM

## 2019-09-20 DIAGNOSIS — J9601 Acute respiratory failure with hypoxia: Secondary | ICD-10-CM

## 2019-09-20 DIAGNOSIS — L304 Erythema intertrigo: Secondary | ICD-10-CM

## 2019-09-20 DIAGNOSIS — J418 Mixed simple and mucopurulent chronic bronchitis: Secondary | ICD-10-CM

## 2019-09-20 DIAGNOSIS — Z0000 Encounter for general adult medical examination without abnormal findings: Secondary | ICD-10-CM | POA: Diagnosis not present

## 2019-09-20 DIAGNOSIS — E7800 Pure hypercholesterolemia, unspecified: Secondary | ICD-10-CM | POA: Diagnosis not present

## 2019-09-20 DIAGNOSIS — I5033 Acute on chronic diastolic (congestive) heart failure: Secondary | ICD-10-CM | POA: Diagnosis not present

## 2019-09-20 DIAGNOSIS — I110 Hypertensive heart disease with heart failure: Secondary | ICD-10-CM | POA: Diagnosis not present

## 2019-09-20 DIAGNOSIS — I5032 Chronic diastolic (congestive) heart failure: Secondary | ICD-10-CM

## 2019-09-20 MED ORDER — SIMVASTATIN 20 MG PO TABS: 20.0000 mg | ORAL_TABLET | Freq: Every day | ORAL | 3 refills | Status: DC

## 2019-09-20 MED ORDER — AMLODIPINE BESYLATE 5 MG PO TABS: 5.0000 mg | ORAL_TABLET | Freq: Every day | ORAL | 3 refills | Status: DC

## 2019-09-20 MED ORDER — FUROSEMIDE 20 MG PO TABS: 20.0000 mg | ORAL_TABLET | Freq: Every day | ORAL | 3 refills | Status: DC | PRN

## 2019-09-20 MED ORDER — METOPROLOL SUCCINATE ER 50 MG PO TB24: ORAL_TABLET | ORAL | 3 refills | Status: DC

## 2019-09-20 MED ORDER — NYSTATIN 100000 UNIT/GM EX CREA: 1.0000 "application " | TOPICAL_CREAM | Freq: Two times a day (BID) | CUTANEOUS | 0 refills | Status: DC

## 2019-09-20 MED ORDER — BUDESONIDE-FORMOTEROL FUMARATE 160-4.5 MCG/ACT IN AERO: 2.0000 | INHALATION_SPRAY | Freq: Two times a day (BID) | RESPIRATORY_TRACT | 3 refills | Status: DC

## 2019-09-20 MED ORDER — BENAZEPRIL-HYDROCHLOROTHIAZIDE 20-12.5 MG PO TABS: 1.0000 | ORAL_TABLET | Freq: Two times a day (BID) | ORAL | 3 refills | Status: AC

## 2019-09-20 MED ORDER — POTASSIUM CHLORIDE CRYS ER 20 MEQ PO TBCR: EXTENDED_RELEASE_TABLET | ORAL | 3 refills | Status: DC

## 2019-09-20 NOTE — Progress Notes (Signed)
[name], Female    DOB: 10-07-1930, 86 y.o.   MRN: [id number] Visit Date: 09/20/2019  Today's Provider: Mar Daring, PA-C   Chief Complaint  Patient presents with  . Medicare Wellness   Subjective:     Annual wellness visit Amanda Strickland is a 86 y.o. female. She feels well. She reports exercising walks some. She reports she is sleeping fairly well. -----------------------------------------------------------   Review of Systems  Constitutional: Negative.   HENT: Positive for voice change.   Eyes: Positive for discharge and itching.       "seasonal"   Respiratory: Negative.   Cardiovascular: Positive for leg swelling.  Gastrointestinal: Negative.   Endocrine: Negative.   Genitourinary: Negative.   Musculoskeletal: Positive for arthralgias.  Skin: Negative.   Allergic/Immunologic: Negative.   Neurological: Negative.   Hematological: Negative.   Psychiatric/Behavioral: Negative.     Social History   Socioeconomic History  . Marital status: Widowed    Spouse name: Not on file  . Number of children: Not on file  . Years of education: Not on file  . Highest education level: Not on file  Occupational History  . Not on file  Tobacco Use  . Smoking status: Never Smoker  . Smokeless tobacco: Never Used  Substance and Sexual Activity  . Alcohol use: No    Alcohol/week: 0.0 standard drinks  . Drug use: No  . Sexual activity: Not on file  Other Topics Concern  . Not on file  Social History Narrative   Very independent at baseline.   Social Determinants of Health   Financial Resource Strain:   . Difficulty of Paying Living Expenses:   Food Insecurity:   . Worried About Charity fundraiser in the Last Year:   . Arboriculturist in the Last Year:   Transportation Needs:   . Film/video editor (Medical):   Marland Kitchen Lack of Transportation (Non-Medical):   Physical Activity:   . Days of Exercise per Week:   . Minutes of Exercise  per Session:   Stress:   . Feeling of Stress :   Social Connections:   . Frequency of Communication with Friends and Family:   . Frequency of Social Gatherings with Friends and Family:   . Attends Religious Services:   . Active Member of Clubs or Organizations:   . Attends Archivist Meetings:   Marland Kitchen Marital Status:   Intimate Partner Violence:   . Fear of Current or Ex-Partner:   . Emotionally Abused:   Marland Kitchen Physically Abused:   . Sexually Abused:     Past Medical History:  Diagnosis Date  . Arthritis   . CAD (coronary artery disease)   . CHF (congestive heart failure) (HCC)    diastolic heart failure  . Depression   . Heart attack ([place]) 1994  . Hypertension      Patient Active Problem List   Diagnosis Date Noted  . Postmenopausal bleeding 08/21/2017  . CHF exacerbation ([place]) 07/24/2017  . COPD (chronic obstructive pulmonary disease) ([place]) 10/15/2015  . Chronic kidney disease (CKD), stage III (moderate) 05/25/2015  . Congestive heart failure due to high blood pressure ([place]) 05/25/2015  . Fasciculation 05/25/2015  . Blood glucose elevated 05/25/2015  . OP (osteoporosis) 05/25/2015  . Leg varices 05/25/2015  . HTN (hypertension) 11/19/2014  . Dizziness 11/19/2014  . CAD (coronary artery disease) of bypass graft 01/29/2014  . History of colon polyps 02/18/2010  .  Arteriosclerosis of coronary artery 03/30/2009  . Hypercholesteremia 03/30/2009    Past Surgical History:  Procedure Laterality Date  . CORONARY ARTERY BYPASS GRAFT  2001    Her family history includes Breast cancer in her maternal aunt; Heart attack in her father.   Current Outpatient Medications:  .  albuterol (PROVENTIL HFA;VENTOLIN HFA) 108 (90 Base) MCG/ACT inhaler, Inhale 2 puffs into the lungs every 6 (six) hours as needed for wheezing or shortness of breath., Disp: 1 Inhaler, Rfl: 0 .  amLODipine (NORVASC) 5 MG tablet, Take 1 tablet (5 mg total) by mouth daily., Disp: 90 tablet, Rfl: 0 .   aspirin 81 MG tablet, Take 81 mg by mouth daily., Disp: , Rfl:  .  benazepril-hydrochlorthiazide (LOTENSIN HCT) 20-12.5 MG tablet, Take 1 tablet by mouth 2 (two) times daily., Disp: 180 tablet, Rfl: 0 .  CALCIUM CARBONATE-VIT D-MIN PO, Take 1 tablet by mouth daily. , Disp: , Rfl:  .  furosemide (LASIX) 20 MG tablet, TAKE ONE TABLET BY MOUTH ONCE DAILY, Disp: 90 tablet, Rfl: 0 .  ipratropium-albuterol (DUONEB) 0.5-2.5 (3) MG/3ML SOLN, USE 1 VIAL VIA NEBULIZER AND INHALE BY MOUTH EVERY 6 HOURS AS NEEDED, Disp: 360 mL, Rfl: 0 .  L-Lysine 500 MG CAPS, Take by mouth. Reported on 10/15/2015, Disp: , Rfl:  .  Menthol-Methyl Salicylate (MUSCLE RUB EX), Apply 1 application topically daily as needed. , Disp: , Rfl:  .  metoprolol succinate (TOPROL-XL) 50 MG 24 hr tablet, TAKE ONE TABLET BY MOUTH ONCE DAILY WITH OR IMMEDIATELY FOLLOWING A MEAL, Disp: 30 tablet, Rfl: 0 .  Misc Natural Products (OSTEO BI-FLEX ADV JOINT SHIELD) TABS, Take by mouth., Disp: , Rfl:  .  Multiple Vitamin (MULTIVITAMIN) capsule, Take 1 capsule by mouth daily., Disp: , Rfl:  .  Omega-3 Fatty Acids (FISH OIL) 1000 MG CAPS, Take 1 capsule by mouth daily., Disp: , Rfl:  .  potassium chloride SA (KLOR-CON) 20 MEQ tablet, TAKE ONE TABLET BY MOUTH ONCE DAILY AS NEEDED, Disp: 90 tablet, Rfl: 0 .  simvastatin (ZOCOR) 20 MG tablet, TAKE ONE TABLET BY MOUTH AT BEDTIME, Disp: 90 tablet, Rfl: 0 .  Spacer/Aero-Holding Chambers (AEROCHAMBER MINI CHAMBER) DEVI, Use spacer with inhalers., Disp: 1 Device, Rfl: 0 .  SYMBICORT 160-4.5 MCG/ACT inhaler, INHALE 2 PUFFS BY MOUTH INTO THE LUNGS TWICE A DAY, Disp: 30.6 g, Rfl: 0 .  vitamin E 100 UNIT capsule, Take 100 Units by mouth daily., Disp: , Rfl:   Patient Care Team: Mar Daring, PA-C as PCP - General (Family Medicine) Ubaldo Glassing Javier Docker, MD as Consulting Physician (Cardiology) Eulogio Bear, MD as Consulting Physician (Ophthalmology) Lillia Carmel, Jeralene Huff, MD as Referring Physician  (Dermatology)    Objective:    Vitals: BP (!) 150/86 (BP Location: Left Arm, Patient Position: Sitting, Cuff Size: Large)   Pulse 68   Temp (!) 96.9 F (36.1 C) (Temporal)   Resp 16   Ht 5\' 4"  (1.626 m)   Wt 151 lb 12.8 oz (68.9 kg)   BMI 26.06 kg/m   Physical Exam Vitals reviewed.  Constitutional:      General: She is not in acute distress.    Appearance: Normal appearance. She is well-developed and normal weight. She is not ill-appearing or diaphoretic.  HENT:     Head: Normocephalic and atraumatic.     Right Ear: Tympanic membrane, ear canal and external ear normal.     Left Ear: Tympanic membrane, ear canal and external ear normal.  Eyes:  General: No scleral icterus.       Right eye: No discharge.        Left eye: No discharge.     Extraocular Movements: Extraocular movements intact.     Conjunctiva/sclera: Conjunctivae normal.     Pupils: Pupils are equal, round, and reactive to light.  Neck:     Thyroid: No thyromegaly.     Vascular: No carotid bruit or JVD.     Trachea: No tracheal deviation.  Cardiovascular:     Rate and Rhythm: Normal rate and regular rhythm.     Pulses: Normal pulses.     Heart sounds: Normal heart sounds. No murmur. No friction rub. No gallop.   Pulmonary:     Effort: Pulmonary effort is normal. No respiratory distress.     Breath sounds: Normal breath sounds. No wheezing or rales.  Chest:     Chest wall: No tenderness.  Abdominal:     General: Abdomen is flat. Bowel sounds are normal. There is no distension.     Palpations: Abdomen is soft. There is no mass.     Tenderness: There is no abdominal tenderness. There is no guarding or rebound.  Musculoskeletal:        General: No tenderness. Normal range of motion.     Cervical back: Normal range of motion and neck supple.  Lymphadenopathy:     Cervical: No cervical adenopathy.  Skin:    General: Skin is warm and dry.     Capillary Refill: Capillary refill takes less than 2 seconds.      Findings: No rash.  Neurological:     General: No focal deficit present.     Mental Status: She is alert and oriented to person, place, and time. Mental status is at baseline.  Psychiatric:        Mood and Affect: Mood normal.        Behavior: Behavior normal.        Thought Content: Thought content normal.        Judgment: Judgment normal.     Activities of Daily Living In your present state of health, do you have any difficulty performing the following activities: 09/20/2019  Hearing? Y  Vision? N  Difficulty concentrating or making decisions? N  Walking or climbing stairs? N  Dressing or bathing? N  Doing errands, shopping? N  Some recent data might be hidden    Fall Risk Assessment Fall Risk  09/20/2019 05/14/2018 04/03/2018 10/20/2016 10/15/2015  Falls in the past year? 0 0 No No No  Number falls in past yr: 0 - - - -  Injury with Fall? 0 - - - -  Follow up Falls evaluation completed - - - -     Depression Screen PHQ 2/9 Scores 09/20/2019 05/14/2018 04/09/2018 04/03/2018  PHQ - 2 Score 0 0 0 0  PHQ- 9 Score - - 1 1    6CIT Screen 04/09/2018  What Year? 0 points  What month? 0 points  What time? 0 points  Count back from 20 0 points  Months in reverse 0 points  Repeat phrase 2 points  Total Score 2      Assessment & Plan:     Annual Wellness Visit  Reviewed patient's Family Medical History Reviewed and updated list of patient's medical providers Assessment of cognitive impairment was done Assessed patient's functional ability Established a written schedule for health screening [place] Completed and Reviewed  Exercise Activities and Dietary recommendations Goals    .  Exercise      Recommend increasing exercise. Pt to start walking 5 days a week for 20-30 minutes at a time.        Immunization History  Administered Date(s) Administered  . Influenza Split 03/30/2009, 04/06/2010  . Influenza, High Dose Seasonal PF 05/12/2014,  05/19/2016, 04/24/2017, 04/03/2018  . Influenza,inj,Quad PF,6+ Mos 05/12/2015  . Pneumococcal Conjugate-13 05/12/2014  . Pneumococcal Polysaccharide-23 10/20/2016  . Td 09/13/2012  . Tdap 09/13/2012    Health Maintenance  Topic Date Due  . INFLUENZA VACCINE  02/09/2019  . TETANUS/TDAP  09/14/2022  . DEXA SCAN  Completed  . PNA vac Low Risk Adult  Completed     Discussed health benefits of physical activity, and encouraged her to engage in regular exercise appropriate for her age and condition.   1. Annual physical exam Normal physical exam today. Will check labs as below and f/u pending lab results. If labs are stable and WNL she will not need to have these rechecked for one year at her next annual physical exam. She is to call the office in the meantime if she has any acute issue, questions or concerns. - CBC w/Diff/Platelet - Comprehensive Metabolic Panel (CMET) - TSH - Lipid Panel With LDL/HDL Ratio - HgB A1c  2. Mixed simple and mucopurulent chronic bronchitis (HCC) Stable. Diagnosis pulled for medication refill. Continue current medical treatment plan. Can decrease Duoneb to prn. Will check labs as below and f/u pending results.  - CBC w/Diff/Platelet - Comprehensive Metabolic Panel (CMET) - TSH - Lipid Panel With LDL/HDL Ratio - HgB A1c - budesonide-formoterol (SYMBICORT) 160-4.5 MCG/ACT inhaler; Inhale 2 puffs into the lungs in the morning and at bedtime.  Dispense: 30.6 g; Refill: 3  3. Chronic diastolic heart failure (HCC) Stable. Diagnosis pulled for medication refill. Continue current medical treatment plan. Will check labs as below and f/u pending results. - CBC w/Diff/Platelet - Comprehensive Metabolic Panel (CMET) - TSH - Lipid Panel With LDL/HDL Ratio - HgB A1c - furosemide (LASIX) 20 MG tablet; Take 1 tablet (20 mg total) by mouth daily as needed for fluid or edema.  Dispense: 90 tablet; Refill: 3 - potassium chloride SA (KLOR-CON) 20 MEQ tablet; TAKE ONE  TABLET BY MOUTH ONCE DAILY AS NEEDED with furosemide  Dispense: 90 tablet; Refill: 3  4. Acute respiratory failure with hypoxia (HCC) Resolved.   5. Hypertensive heart disease with acute on chronic diastolic congestive heart failure (HCC) Stable. Diagnosis pulled for medication refill. Continue current medical treatment plan. Will check labs as below and f/u pending results. - CBC w/Diff/Platelet - Comprehensive Metabolic Panel (CMET) - TSH - Lipid Panel With LDL/HDL Ratio - HgB A1c  6. Hypercholesteremia Stable. Diagnosis pulled for medication refill. Continue current medical treatment plan. Will check labs as below and f/u pending results. - CBC w/Diff/Platelet - Comprehensive Metabolic Panel (CMET) - TSH - Lipid Panel With LDL/HDL Ratio - HgB A1c - simvastatin (ZOCOR) 20 MG tablet; Take 1 tablet (20 mg total) by mouth at bedtime.  Dispense: 90 tablet; Refill: 3  7. Blood glucose elevated Diet controlled. Will check labs as below and f/u pending results. - CBC w/Diff/Platelet - Comprehensive Metabolic Panel (CMET) - TSH - Lipid Panel With LDL/HDL Ratio - HgB A1c  8. Essential hypertension Stable. Diagnosis pulled for medication refill. Continue current medical treatment plan. Will check labs as below and f/u pending results. - CBC w/Diff/Platelet - Comprehensive Metabolic Panel (CMET) - TSH - Lipid Panel With LDL/HDL Ratio - HgB A1c -  amLODipine (NORVASC) 5 MG tablet; Take 1 tablet (5 mg total) by mouth daily.  Dispense: 90 tablet; Refill: 3 - benazepril-hydrochlorthiazide (LOTENSIN HCT) 20-12.5 MG tablet; Take 1 tablet by mouth 2 (two) times daily.  Dispense: 180 tablet; Refill: 3 - metoprolol succinate (TOPROL-XL) 50 MG 24 hr tablet; TAKE ONE TABLET BY MOUTH ONCE DAILY WITH OR IMMEDIATELY FOLLOWING A MEAL  Dispense: 90 tablet; Refill: 3  ------------------------------------------------------------------------------------------------------------    Mar Daring,  PA-C  [facility]

## 2019-09-20 NOTE — Patient Instructions (Signed)
Health Maintenance After Age 84 After age 84, you are at a higher risk for certain long-term diseases and infections as well as injuries from falls. Falls are a major cause of broken bones and head injuries in people who are older than age 84. Getting regular preventive care can help to keep you healthy and well. Preventive care includes getting regular testing and making lifestyle changes as recommended by your health care provider. Talk with your health care provider about:  Which screenings and tests you should have. A screening is a test that checks for a disease when you have no symptoms.  A diet and exercise plan that is right for you. What should I know about screenings and tests to prevent falls? Screening and testing are the best ways to find a health problem early. Early diagnosis and treatment give you the best chance of managing medical conditions that are common after age 84. Certain conditions and lifestyle choices may make you more likely to have a fall. Your health care provider may recommend:  Regular vision checks. Poor vision and conditions such as cataracts can make you more likely to have a fall. If you wear glasses, make sure to get your prescription updated if your vision changes.  Medicine review. Work with your health care provider to regularly review all of the medicines you are taking, including over-the-counter medicines. Ask your health care provider about any side effects that may make you more likely to have a fall. Tell your health care provider if any medicines that you take make you feel dizzy or sleepy.  Osteoporosis screening. Osteoporosis is a condition that causes the bones to get weaker. This can make the bones weak and cause them to break more easily.  Blood pressure screening. Blood pressure changes and medicines to control blood pressure can make you feel dizzy.  Strength and balance checks. Your health care provider may recommend certain tests to check your  strength and balance while standing, walking, or changing positions.  Foot health exam. Foot pain and numbness, as well as not wearing proper footwear, can make you more likely to have a fall.  Depression screening. You may be more likely to have a fall if you have a fear of falling, feel emotionally low, or feel unable to do activities that you used to do.  Alcohol use screening. Using too much alcohol can affect your balance and may make you more likely to have a fall. What actions can I take to lower my risk of falls? General instructions  Talk with your health care provider about your risks for falling. Tell your health care provider if: ? You fall. Be sure to tell your health care provider about all falls, even ones that seem minor. ? You feel dizzy, sleepy, or off-balance.  Take over-the-counter and prescription medicines only as told by your health care provider. These include any supplements.  Eat a healthy diet and maintain a healthy weight. A healthy diet includes low-fat dairy products, low-fat (lean) meats, and fiber from whole grains, beans, and lots of fruits and vegetables. Home safety  Remove any tripping hazards, such as rugs, cords, and clutter.  Install safety equipment such as grab bars in bathrooms and safety rails on stairs.  Keep rooms and walkways well-lit. Activity   Follow a regular exercise program to stay fit. This will help you maintain your balance. Ask your health care provider what types of exercise are appropriate for you.  If you need a cane or   walker, use it as recommended by your health care provider.  Wear supportive shoes that have nonskid soles. Lifestyle  Do not drink alcohol if your health care provider tells you not to drink.  If you drink alcohol, limit how much you have: ? 0-1 drink a day for women. ? 0-2 drinks a day for men.  Be aware of how much alcohol is in your drink. In the [place] one drink equals one typical bottle of beer (12  oz), one-half glass of wine (5 oz), or one shot of hard liquor (1 oz).  Do not use any products that contain nicotine or tobacco, such as cigarettes and e-cigarettes. If you need help quitting, ask your health care provider. Summary  Having a healthy lifestyle and getting preventive care can help to protect your health and wellness after age 84.  Screening and testing are the best way to find a health problem early and help you avoid having a fall. Early diagnosis and treatment give you the best chance for managing medical conditions that are more common for people who are older than age 84.  Falls are a major cause of broken bones and head injuries in people who are older than age 84. Take precautions to prevent a fall at home.  Work with your health care provider to learn what changes you can make to improve your health and wellness and to prevent falls. This information is not intended to replace advice given to you by your health care provider. Make sure you discuss any questions you have with your health care provider. Document Revised: 10/18/2018 Document Reviewed: 05/10/2017 Elsevier Patient Education  2020 Elsevier Inc.  

## 2019-09-23 ENCOUNTER — Telehealth: Payer: Self-pay

## 2019-09-23 DIAGNOSIS — I110 Hypertensive heart disease with heart failure: Secondary | ICD-10-CM | POA: Diagnosis not present

## 2019-09-23 DIAGNOSIS — I5032 Chronic diastolic (congestive) heart failure: Secondary | ICD-10-CM | POA: Diagnosis not present

## 2019-09-23 DIAGNOSIS — Z0000 Encounter for general adult medical examination without abnormal findings: Secondary | ICD-10-CM | POA: Diagnosis not present

## 2019-09-23 DIAGNOSIS — I10 Essential (primary) hypertension: Secondary | ICD-10-CM | POA: Diagnosis not present

## 2019-09-23 DIAGNOSIS — I5033 Acute on chronic diastolic (congestive) heart failure: Secondary | ICD-10-CM | POA: Diagnosis not present

## 2019-09-23 DIAGNOSIS — R739 Hyperglycemia, unspecified: Secondary | ICD-10-CM | POA: Diagnosis not present

## 2019-09-23 DIAGNOSIS — J418 Mixed simple and mucopurulent chronic bronchitis: Secondary | ICD-10-CM | POA: Diagnosis not present

## 2019-09-23 DIAGNOSIS — J449 Chronic obstructive pulmonary disease, unspecified: Secondary | ICD-10-CM | POA: Diagnosis not present

## 2019-09-23 DIAGNOSIS — J9601 Acute respiratory failure with hypoxia: Secondary | ICD-10-CM | POA: Diagnosis not present

## 2019-09-23 DIAGNOSIS — E7800 Pure hypercholesterolemia, unspecified: Secondary | ICD-10-CM | POA: Diagnosis not present

## 2019-09-23 NOTE — Telephone Encounter (Signed)
Please Review

## 2019-09-23 NOTE — Telephone Encounter (Signed)
Her belly button

## 2019-09-23 NOTE — Telephone Encounter (Signed)
Patient advised as directed below. 

## 2019-09-23 NOTE — Telephone Encounter (Signed)
Copied from [place] [id number]. Topic: General - Other >> Sep 23, 2019  1:22 PM Yvette Rack wrote: Reason for CRM: Renee with [facility] stated she needs to know where the patient will be applying the nystatin cream (MYCOSTATIN). Renee requests call back. Cb# [phone]

## 2019-09-24 ENCOUNTER — Telehealth: Payer: Self-pay

## 2019-09-24 LAB — CBC WITH DIFFERENTIAL/PLATELET
Basophils Absolute: 0.1 10*3/uL (ref 0.0–0.2)
Basos: 2 %
EOS (ABSOLUTE): 0.4 10*3/uL (ref 0.0–0.4)
Eos: 7 %
Hematocrit: 45.6 % (ref 34.0–46.6)
Hemoglobin: 15.5 g/dL (ref 11.1–15.9)
Immature Grans (Abs): 0 10*3/uL (ref 0.0–0.1)
Immature Granulocytes: 0 %
Lymphocytes Absolute: 1.8 10*3/uL (ref 0.7–3.1)
Lymphs: 28 %
MCH: 32.2 pg (ref 26.6–33.0)
MCHC: 34 g/dL (ref 31.5–35.7)
MCV: 95 fL (ref 79–97)
Monocytes Absolute: 0.6 10*3/uL (ref 0.1–0.9)
Monocytes: 9 %
Neutrophils Absolute: 3.4 10*3/uL (ref 1.4–7.0)
Neutrophils: 54 %
Platelets: 137 10*3/uL — ABNORMAL LOW (ref 150–450)
RBC: 4.82 x10E6/uL (ref 3.77–5.28)
RDW: 12.3 % (ref 11.7–15.4)
WBC: 6.3 10*3/uL (ref 3.4–10.8)

## 2019-09-24 LAB — COMPREHENSIVE METABOLIC PANEL
ALT: 23 IU/L (ref 0–32)
AST: 35 IU/L (ref 0–40)
Albumin/Globulin Ratio: 1.8 (ref 1.2–2.2)
Albumin: 4.4 g/dL (ref 3.6–4.6)
Alkaline Phosphatase: 95 IU/L (ref 39–117)
BUN/Creatinine Ratio: 19 (ref 12–28)
BUN: 16 mg/dL (ref 8–27)
Bilirubin Total: 0.9 mg/dL (ref 0.0–1.2)
CO2: 26 mmol/L (ref 20–29)
Calcium: 10.1 mg/dL (ref 8.7–10.3)
Chloride: 102 mmol/L (ref 96–106)
Creatinine, Ser: 0.85 mg/dL (ref 0.57–1.00)
GFR calc Af Amer: 72 mL/min/{1.73_m2} (ref >59)
GFR calc non Af Amer: 62 mL/min/{1.73_m2} (ref >59)
Globulin, Total: 2.4 g/dL (ref 1.5–4.5)
Glucose: 124 mg/dL — ABNORMAL HIGH (ref 65–99)
Potassium: 3.6 mmol/L (ref 3.5–5.2)
Sodium: 145 mmol/L — ABNORMAL HIGH (ref 134–144)
Total Protein: 6.8 g/dL (ref 6.0–8.5)

## 2019-09-24 LAB — LIPID PANEL WITH LDL/HDL RATIO
Cholesterol, Total: 170 mg/dL (ref 100–199)
HDL: 65 mg/dL (ref >39)
LDL Chol Calc (NIH): 80 mg/dL (ref 0–99)
LDL/HDL Ratio: 1.2 ratio (ref 0.0–3.2)
Triglycerides: 149 mg/dL (ref 0–149)
VLDL Cholesterol Cal: 25 mg/dL (ref 5–40)

## 2019-09-24 LAB — HEMOGLOBIN A1C
Est. average glucose Bld gHb Est-mCnc: 114 mg/dL
Hgb A1c MFr Bld: 5.6 % (ref 4.8–5.6)

## 2019-09-24 LAB — TSH: TSH: 2.02 u[IU]/mL (ref 0.450–4.500)

## 2019-09-24 NOTE — Telephone Encounter (Signed)
-----   Message from Mar Daring, [place] sent at 09/24/2019 11:25 AM EDT ----- Blood count is normal. The lower platelet count is normal when taking Aspirin. Kidney function and liver enzymes are normal. Sodium is borderline high, limit salt intake slightly. Potassium, calcium and protein levels are normal. Thyroid is normal. Cholesterol is normal. Sugar/A1c is normal.

## 2019-09-24 NOTE — Telephone Encounter (Signed)
LMTCB or to view message/results in my chart. If patient or [facility]) calls back OK for the [facility] to give results.

## 2019-09-25 IMAGING — CR DG CHEST 2V
1 series · 2 of 2 positions shown · non-contrast
Comparison: 05/14/2018

CLINICAL DATA: Shortness of breath

EXAM:
CHEST - 2 VIEW

[Series 1: dg chest 2 view · 0.14mm/px · 2 of 2 slices shown]
[im 1/2]
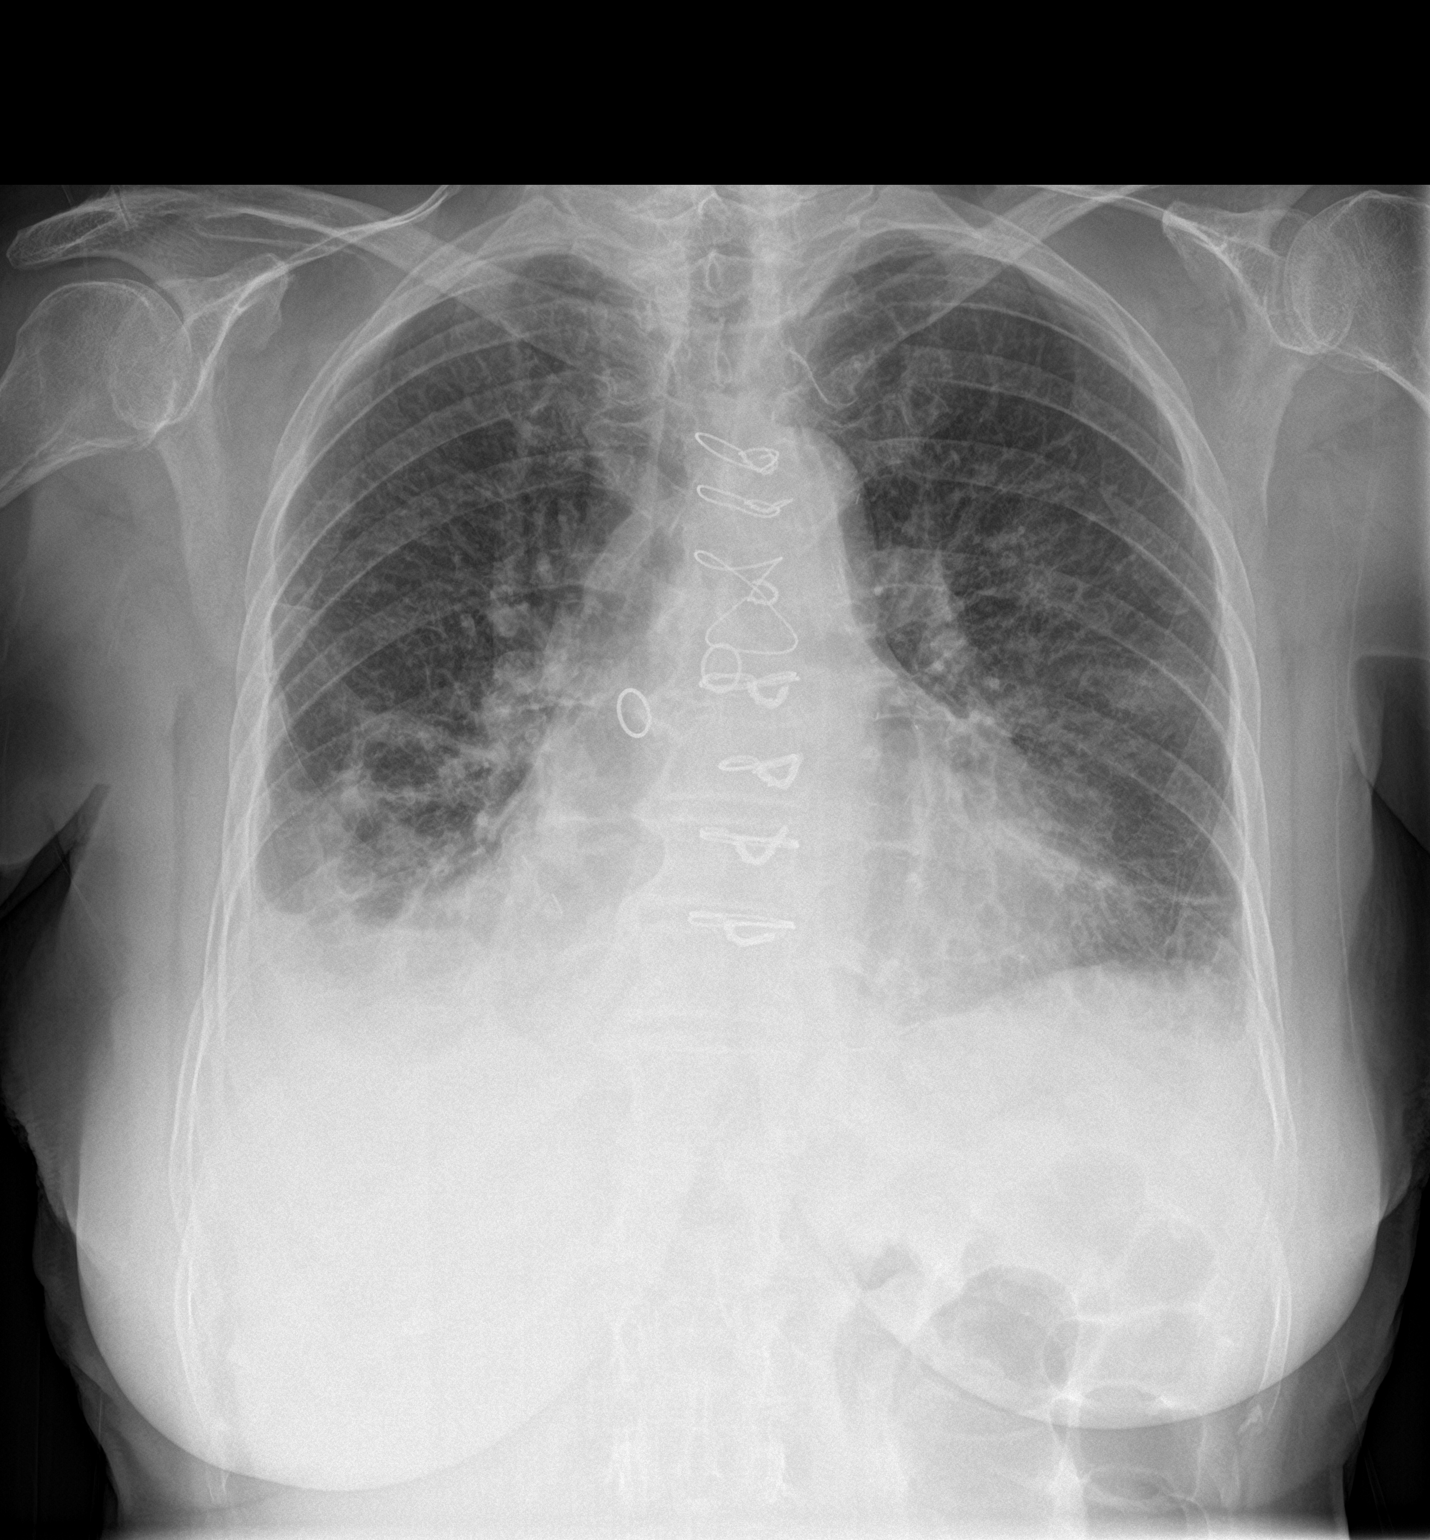
[im 2/2]
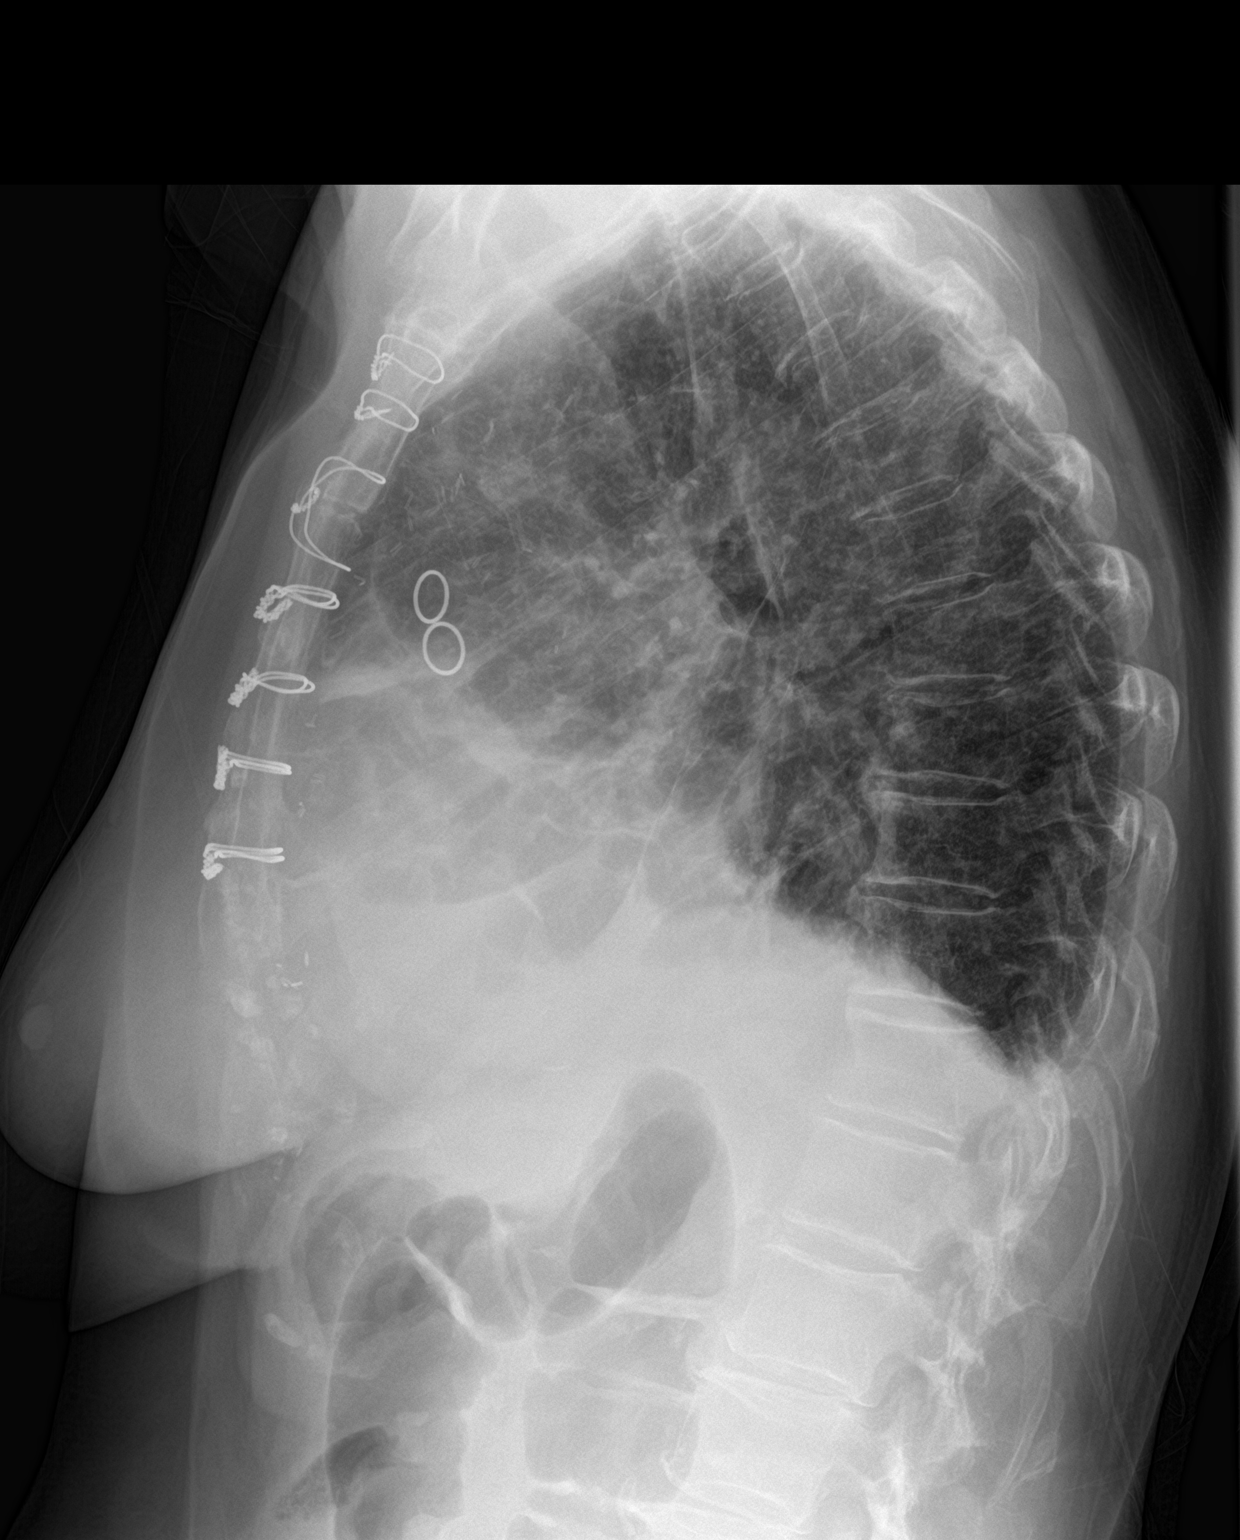

[2 of 2 positions shown; findings below may reference images not displayed]

FINDINGS: Cardiomegaly with sequelae of CABG. Bibasilar atelectasis with small
right pleural effusion. Unchanged mild interstitial pulmonary edema.
IMPRESSION: Cardiomegaly with mild interstitial pulmonary edema, right pleural
effusion and bibasilar atelectasis.

## 2019-09-26 NOTE — Telephone Encounter (Signed)
I returned the call to [facility], Malvina's daughter who is on the [place].    I read the message from Fenton Malling to her dated 09/24/2019 at 11:25 AM. Karena Addison asked what her sodium and potassium and cholesterol numbers were so I read her those numbers.  Karena Addison thanked me for calling her back.

## 2019-09-26 NOTE — Telephone Encounter (Signed)
Pt daughter Karena Addison returned call for pt results. Dee requests call back [phone]

## 2020-04-06 DIAGNOSIS — Z23 Encounter for immunization: Secondary | ICD-10-CM | POA: Diagnosis not present

## 2020-04-06 DIAGNOSIS — Z7189 Other specified counseling: Secondary | ICD-10-CM | POA: Diagnosis not present

## 2020-05-05 ENCOUNTER — Ambulatory Visit: Payer: Self-pay

## 2020-05-05 NOTE — Telephone Encounter (Signed)
Noted  

## 2020-05-05 NOTE — Telephone Encounter (Signed)
Patients daughter called stating that her mother called her and was on her way to her home.  She say her mother states that she was short of breath and wanted her to contact the office for possible appointment.Daughter Prentiss Bells states that when her mother called she was speaking in complete sentences and did not seem to be in distress.  She states her mother denies chest pain.  She states her mother has inhalers and nebulizer she uses at home. She takes lasix as needed for CHF symptoms. I was unable to triage but did go through DT with daughter.  She was told that her mother would need a virtual appointment. Ms Zola Button stated that she really wanted someone to listen to her mothers chest. She will take her to UC when she arrives or ER if she feels she is in distress.    Reason for Disposition . [1] MODERATE difficulty breathing (e.g., speaks in phrases, SOB even at rest, pulse 100-120) AND [2] NEW-onset or WORSE than normal  Protocols used: BREATHING DIFFICULTY-A-AH

## 2020-05-05 NOTE — Telephone Encounter (Signed)
FYI- Jenni's patient.

## 2020-05-06 ENCOUNTER — Other Ambulatory Visit: Payer: Self-pay

## 2020-05-06 ENCOUNTER — Ambulatory Visit (INDEPENDENT_AMBULATORY_CARE_PROVIDER_SITE_OTHER): Payer: PPO | Admitting: Physician Assistant

## 2020-05-06 ENCOUNTER — Encounter: Payer: Self-pay | Admitting: Physician Assistant

## 2020-05-06 VITALS — BP 104/82 | HR 82 | Temp 97.6°F | Wt 154.4 lb

## 2020-05-06 DIAGNOSIS — I10 Essential (primary) hypertension: Secondary | ICD-10-CM

## 2020-05-06 DIAGNOSIS — J441 Chronic obstructive pulmonary disease with (acute) exacerbation: Secondary | ICD-10-CM | POA: Diagnosis not present

## 2020-05-06 DIAGNOSIS — E7800 Pure hypercholesterolemia, unspecified: Secondary | ICD-10-CM | POA: Diagnosis not present

## 2020-05-06 DIAGNOSIS — J418 Mixed simple and mucopurulent chronic bronchitis: Secondary | ICD-10-CM

## 2020-05-06 MED ORDER — BUDESONIDE-FORMOTEROL FUMARATE 160-4.5 MCG/ACT IN AERO: 2.0000 | INHALATION_SPRAY | Freq: Two times a day (BID) | RESPIRATORY_TRACT | 3 refills | Status: DC

## 2020-05-06 MED ORDER — ALBUTEROL SULFATE HFA 108 (90 BASE) MCG/ACT IN AERS: 2.0000 | INHALATION_SPRAY | Freq: Four times a day (QID) | RESPIRATORY_TRACT | 1 refills | Status: DC | PRN

## 2020-05-06 NOTE — Progress Notes (Signed)
Established patient visit   [name]   DOB: [birth year]   86 y.o. Female  MRN: [id number] Visit Date: 05/06/2020  Today's healthcare provider: Trinna Post, PA-C   Chief Complaint  Patient presents with  . Hypertension  I,Porsha C McClurkin,acting as a scribe for [employer], PA-C.,have documented all relevant documentation on the behalf of Trinna Post, PA-C,as directed by  Trinna Post, PA-C while in the presence of Trinna Post, PA-C.  Subjective    HPI  Hypertension, follow-up  BP Readings from Last 3 Encounters:  05/06/20 104/82  09/20/19 (!) 150/86  01/23/19 133/66   Wt Readings from Last 3 Encounters:  05/06/20 154 lb 6.4 oz (70 kg)  09/20/19 151 lb 12.8 oz (68.9 kg)  01/23/19 156 lb (70.8 kg)     She was last seen for hypertension 7 months ago.  BP at that visit was 150/86. Management since that visit includes continue current medication.  She reports good compliance with treatment. She does have some dizziness which does seem chronic in nature.  She is following a Regular diet. She is exercising. She does not smoke.  Use of agents associated with hypertension: none.   Outside blood pressures are arranging 110's-130's/70's-80's. Symptoms: No chest pain No chest pressure  No palpitations No syncope  No dyspnea No orthopnea  No paroxysmal nocturnal dyspnea No lower extremity edema   Pertinent labs: Lab Results  Component Value Date   CHOL 170 09/23/2019   HDL 65 09/23/2019   LDLCALC 80 09/23/2019   TRIG 149 09/23/2019   CHOLHDL 2.3 01/23/2019   Lab Results  Component Value Date   NA 145 (H) 09/23/2019   K 3.6 09/23/2019   CREATININE 0.85 09/23/2019   GFRNONAA 62 09/23/2019   GFRAA 72 09/23/2019   GLUCOSE 124 (H) 09/23/2019     The ASCVD Risk score (Goff DC Jr., et al., 2013) failed to calculate for the following reasons:   The 2013 ASCVD risk score is only valid for ages 32 to 6   Wt Readings from  Last 3 Encounters:  05/06/20 154 lb 6.4 oz (70 kg)  09/20/19 151 lb 12.8 oz (68.9 kg)  01/23/19 156 lb (70.8 kg)   COPD, Follow up  She was last seen for this 6 months ago. Changes made include continue symbicort daily and albuterol PRN.    She reports fair compliance with treatment. There have been some mishaps with taking the symbicort involving inhaling from empty cartridges and not actually getting the medicine. She did do a nebulizer treatment today. She is not having side effects.  She IS experiencing cough and wheezing. She is NOT experiencing weight loss or fever. she reports breathing is Unchanged.  Pulmonary Functions Testing Results:  No results found for: FEV1, FVC, FEV1FVC, TLC  -----------------------------------------------------------------------------------------   ---------------------------------------------------------------------------------------------------      Medications: Outpatient Medications Prior to Visit  Medication Sig  . amLODipine (NORVASC) 5 MG tablet Take 1 tablet (5 mg total) by mouth daily.  Marland Kitchen aspirin 81 MG tablet Take 81 mg by mouth daily.  . benazepril-hydrochlorthiazide (LOTENSIN HCT) 20-12.5 MG tablet Take 1 tablet by mouth 2 (two) times daily.  Marland Kitchen CALCIUM CARBONATE-VIT D-MIN PO Take 1 tablet by mouth daily.   . furosemide (LASIX) 20 MG tablet Take 1 tablet (20 mg total) by mouth daily as needed for fluid or edema.  Marland Kitchen L-Lysine 500 MG CAPS Take by mouth. Reported on 10/15/2015  . Menthol-Methyl  Salicylate (MUSCLE RUB EX) Apply 1 application topically daily as needed.   . metoprolol succinate (TOPROL-XL) 50 MG 24 hr tablet TAKE ONE TABLET BY MOUTH ONCE DAILY WITH OR IMMEDIATELY FOLLOWING A MEAL  . Multiple Vitamin (MULTIVITAMIN) capsule Take 1 capsule by mouth daily.  Marland Kitchen nystatin cream (MYCOSTATIN) Apply 1 application topically 2 (two) times daily.  . Omega-3 Fatty Acids (FISH OIL) 1000 MG CAPS Take 1 capsule by mouth daily.  . potassium  chloride SA (KLOR-CON) 20 MEQ tablet TAKE ONE TABLET BY MOUTH ONCE DAILY AS NEEDED with furosemide  . simvastatin (ZOCOR) 20 MG tablet Take 1 tablet (20 mg total) by mouth at bedtime.  Marland Kitchen Spacer/Aero-Holding Chambers (AEROCHAMBER MINI CHAMBER) DEVI Use spacer with inhalers.  . [DISCONTINUED] albuterol (PROVENTIL HFA;VENTOLIN HFA) 108 (90 Base) MCG/ACT inhaler Inhale 2 puffs into the lungs every 6 (six) hours as needed for wheezing or shortness of breath. (Patient not taking: Reported on 05/06/2020)  . [DISCONTINUED] budesonide-formoterol (SYMBICORT) 160-4.5 MCG/ACT inhaler Inhale 2 puffs into the lungs in the morning and at bedtime. (Patient not taking: Reported on 05/06/2020)  . [DISCONTINUED] ipratropium-albuterol (DUONEB) 0.5-2.5 (3) MG/3ML SOLN USE 1 VIAL VIA NEBULIZER AND INHALE BY MOUTH EVERY 6 HOURS AS NEEDED (Patient not taking: Reported on 05/06/2020)  . [DISCONTINUED] Misc Natural Products (OSTEO BI-FLEX ADV JOINT SHIELD) TABS Take by mouth. (Patient not taking: Reported on 05/06/2020)  . [DISCONTINUED] vitamin E 100 UNIT capsule Take 100 Units by mouth daily. (Patient not taking: Reported on 05/06/2020)   No facility-administered medications prior to visit.    Review of Systems  Constitutional: Negative.   Respiratory: Negative.   Hematological: Negative.       Objective    BP 104/82 (BP Location: Left Arm, Patient Position: Sitting, Cuff Size: Large)   Pulse 82   Temp 97.6 F (36.4 C) (Oral)   Wt 154 lb 6.4 oz (70 kg)   SpO2 99%   BMI 26.50 kg/m    Physical Exam Constitutional:      Appearance: Normal appearance.  Cardiovascular:     Rate and Rhythm: Normal rate and regular rhythm.     Heart sounds: Normal heart sounds.  Pulmonary:     Effort: Pulmonary effort is normal.     Breath sounds: Normal breath sounds.  Skin:    General: Skin is warm and dry.  Neurological:     General: No focal deficit present.     Mental Status: She is alert and oriented to person,  place, and time.  Psychiatric:        Mood and Affect: Mood normal.        Behavior: Behavior normal.       No results found for any visits on 05/06/20.  Assessment & Plan    1. Primary hypertension  Stable. May consider reducing amlodipine to 2.5 mg QD as BP is low normal and may be contributing to dizziness. Defer labs to next visit.   2. Mixed simple and mucopurulent chronic bronchitis (HCC)  - budesonide-formoterol (SYMBICORT) 160-4.5 MCG/ACT inhaler; Inhale 2 puffs into the lungs in the morning and at bedtime.  Dispense: 30.6 g; Refill: 3  3. Hypercholesteremia   4. COPD exacerbation ([place])    No follow-ups on file.      ITrinna Post, PA-C, have reviewed all documentation for this visit. The documentation on 05/13/20 for the exam, diagnosis, procedures, and orders are all accurate and complete.  The entirety of the information documented in the History of  Present Illness, Review of Systems and Physical Exam were personally obtained by me. Portions of this information were initially documented by [facility] and reviewed by me for thoroughness and accuracy.     Paulene Floor  [facility] [phone] (phone) [phone] (fax)  [place]

## 2020-05-06 NOTE — Patient Instructions (Addendum)
Can trial off amlodipine to see if this helps with balance.  Voltaren gel anti-inflammatory for neck pain   Hypertension, Adult Hypertension is another name for high blood pressure. High blood pressure forces your heart to work harder to pump blood. This can cause problems over time. There are two numbers in a blood pressure reading. There is a top number (systolic) over a bottom number (diastolic). It is best to have a blood pressure that is below 120/80. Healthy choices can help lower your blood pressure, or you may need medicine to help lower it. What are the causes? The cause of this condition is not known. Some conditions may be related to high blood pressure. What increases the risk?  Smoking.  Having type 2 diabetes mellitus, high cholesterol, or both.  Not getting enough exercise or physical activity.  Being overweight.  Having too much fat, sugar, calories, or salt (sodium) in your diet.  Drinking too much alcohol.  Having long-term (chronic) kidney disease.  Having a family history of high blood pressure.  Age. Risk increases with age.  Race. You may be at higher risk if you are African American.  Gender. Men are at higher risk than women before age 69. After age 70, women are at higher risk than men.  Having obstructive sleep apnea.  Stress. What are the signs or symptoms?  High blood pressure may not cause symptoms. Very high blood pressure (hypertensive crisis) may cause: ? Headache. ? Feelings of worry or nervousness (anxiety). ? Shortness of breath. ? Nosebleed. ? A feeling of being sick to your stomach (nausea). ? Throwing up (vomiting). ? Changes in how you see. ? Very bad chest pain. ? Seizures. How is this treated?  This condition is treated by making healthy lifestyle changes, such as: ? Eating healthy foods. ? Exercising more. ? Drinking less alcohol.  Your health care provider may prescribe medicine if lifestyle changes are not enough to  get your blood pressure under control, and if: ? Your top number is above 130. ? Your bottom number is above 80.  Your personal target blood pressure may vary. Follow these instructions at home: Eating and drinking   If told, follow the DASH eating plan. To follow this plan: ? Fill one half of your plate at each meal with fruits and vegetables. ? Fill one fourth of your plate at each meal with whole grains. Whole grains include whole-wheat pasta, brown rice, and whole-grain bread. ? Eat or drink low-fat dairy products, such as skim milk or low-fat yogurt. ? Fill one fourth of your plate at each meal with low-fat (lean) proteins. Low-fat proteins include fish, chicken without skin, eggs, beans, and tofu. ? Avoid fatty meat, cured and processed meat, or chicken with skin. ? Avoid pre-made or processed food.  Eat less than 1,500 mg of salt each day.  Do not drink alcohol if: ? Your doctor tells you not to drink. ? You are pregnant, may be pregnant, or are planning to become pregnant.  If you drink alcohol: ? Limit how much you use to:  0-1 drink a day for women.  0-2 drinks a day for men. ? Be aware of how much alcohol is in your drink. In the [place] one drink equals one 12 oz bottle of beer (355 mL), one 5 oz glass of wine (148 mL), or one 1 oz glass of hard liquor (44 mL). Lifestyle   Work with your doctor to stay at a healthy weight or to lose  weight. Ask your doctor what the best weight is for you.  Get at least 30 minutes of exercise most days of the week. This may include walking, swimming, or biking.  Get at least 30 minutes of exercise that strengthens your muscles (resistance exercise) at least 3 days a week. This may include lifting weights or doing Pilates.  Do not use any products that contain nicotine or tobacco, such as cigarettes, e-cigarettes, and chewing tobacco. If you need help quitting, ask your doctor.  Check your blood pressure at home as told by your  doctor.  Keep all follow-up visits as told by your doctor. This is important. Medicines  Take over-the-counter and prescription medicines only as told by your doctor. Follow directions carefully.  Do not skip doses of blood pressure medicine. The medicine does not work as well if you skip doses. Skipping doses also puts you at risk for problems.  Ask your doctor about side effects or reactions to medicines that you should watch for. Contact a doctor if you:  Think you are having a reaction to the medicine you are taking.  Have headaches that keep coming back (recurring).  Feel dizzy.  Have swelling in your ankles.  Have trouble with your vision. Get help right away if you:  Get a very bad headache.  Start to feel mixed up (confused).  Feel weak or numb.  Feel faint.  Have very bad pain in your: ? Chest. ? Belly (abdomen).  Throw up more than once.  Have trouble breathing. Summary  Hypertension is another name for high blood pressure.  High blood pressure forces your heart to work harder to pump blood.  For most people, a normal blood pressure is less than 120/80.  Making healthy choices can help lower blood pressure. If your blood pressure does not get lower with healthy choices, you may need to take medicine. This information is not intended to replace advice given to you by your health care provider. Make sure you discuss any questions you have with your health care provider. Document Revised: 03/07/2018 Document Reviewed: 03/07/2018 Elsevier Patient Education  2020 [employer].

## 2020-05-12 ENCOUNTER — Telehealth: Payer: Self-pay

## 2020-05-12 MED ORDER — ALBUTEROL SULFATE HFA 108 (90 BASE) MCG/ACT IN AERS: 2.0000 | INHALATION_SPRAY | Freq: Four times a day (QID) | RESPIRATORY_TRACT | 3 refills | Status: DC | PRN

## 2020-05-12 NOTE — Telephone Encounter (Signed)
Changed.

## 2020-05-12 NOTE — Telephone Encounter (Signed)
Copied from [place] [id number]. Topic: General - Other >> May 12, 2020 10:28 AM Antonieta Iba C wrote: Reason for CRM: pharmacy called in for assistance. Pt's insurance will not cover albuterol (VENTOLIN HFA) 108 (90 Base) MCG/ACT inhaler. Pharmacy is requesting a Rx for pro-air instead.   Please assist

## 2020-05-13 ENCOUNTER — Encounter: Payer: Self-pay | Admitting: Physician Assistant

## 2020-05-13 ENCOUNTER — Other Ambulatory Visit: Payer: Self-pay

## 2020-05-13 ENCOUNTER — Ambulatory Visit
Admission: RE | Admit: 2020-05-13 | Discharge: 2020-05-13 | Disposition: A | Discharge status: Home / Self Care | Payer: PPO | Attending: Physician Assistant | Admitting: Physician Assistant

## 2020-05-13 ENCOUNTER — Ambulatory Visit
Admission: RE | Admit: 2020-05-13 | Discharge: 2020-05-13 | Disposition: A | Discharge status: Home / Self Care | Payer: PPO | Source: Ambulatory Visit | Attending: Physician Assistant | Admitting: Physician Assistant

## 2020-05-13 ENCOUNTER — Ambulatory Visit (INDEPENDENT_AMBULATORY_CARE_PROVIDER_SITE_OTHER): Payer: PPO | Admitting: Physician Assistant

## 2020-05-13 VITALS — BP 120/58 | HR 74 | Resp 16 | Wt 156.7 lb

## 2020-05-13 DIAGNOSIS — R739 Hyperglycemia, unspecified: Secondary | ICD-10-CM

## 2020-05-13 DIAGNOSIS — J441 Chronic obstructive pulmonary disease with (acute) exacerbation: Secondary | ICD-10-CM | POA: Insufficient documentation

## 2020-05-13 DIAGNOSIS — J90 Pleural effusion, not elsewhere classified: Secondary | ICD-10-CM | POA: Diagnosis not present

## 2020-05-13 DIAGNOSIS — J9811 Atelectasis: Secondary | ICD-10-CM | POA: Diagnosis not present

## 2020-05-13 DIAGNOSIS — R0989 Other specified symptoms and signs involving the circulatory and respiratory systems: Secondary | ICD-10-CM | POA: Diagnosis not present

## 2020-05-13 DIAGNOSIS — I517 Cardiomegaly: Secondary | ICD-10-CM | POA: Diagnosis not present

## 2020-05-13 DIAGNOSIS — R059 Cough, unspecified: Secondary | ICD-10-CM | POA: Diagnosis not present

## 2020-05-13 DIAGNOSIS — I10 Essential (primary) hypertension: Secondary | ICD-10-CM | POA: Diagnosis not present

## 2020-05-13 DIAGNOSIS — E7800 Pure hypercholesterolemia, unspecified: Secondary | ICD-10-CM | POA: Diagnosis not present

## 2020-05-13 DIAGNOSIS — J449 Chronic obstructive pulmonary disease, unspecified: Secondary | ICD-10-CM | POA: Diagnosis not present

## 2020-05-13 DIAGNOSIS — R0602 Shortness of breath: Secondary | ICD-10-CM | POA: Diagnosis not present

## 2020-05-13 NOTE — Progress Notes (Signed)
Established patient visit   [name]   DOB: 1931/03/24   86 y.o. Female  MRN: [id number] Visit Date: 05/13/2020  Today's healthcare provider: Mar Daring, PA-C   Chief Complaint  Patient presents with  . Follow-up   Subjective    HPI  Follow up for blood pressure  The patient was last seen for this 1 months ago. Changes made at last visit include no changes.  She reports excellent compliance with treatment. She feels that condition is Unchanged. She is not having side effects.   -----------------------------------------------------------------------------------------   Patient Active Problem List   Diagnosis Date Noted  . Acute respiratory failure with hypoxia ([place]) 09/20/2019  . Postmenopausal bleeding 08/21/2017  . CHF exacerbation ([place]) 07/24/2017  . COPD (chronic obstructive pulmonary disease) ([place]) 10/15/2015  . Chronic kidney disease (CKD), stage III (moderate) ([place]) 05/25/2015  . Congestive heart failure due to high blood pressure ([place]) 05/25/2015  . Fasciculation 05/25/2015  . Blood glucose elevated 05/25/2015  . OP (osteoporosis) 05/25/2015  . Leg varices 05/25/2015  . HTN (hypertension) 11/19/2014  . Dizziness 11/19/2014  . CAD (coronary artery disease) of bypass graft 01/29/2014  . History of colon polyps 02/18/2010  . Arteriosclerosis of coronary artery 03/30/2009  . Hypercholesteremia 03/30/2009   Social History   Tobacco Use  . Smoking status: Never Smoker  . Smokeless tobacco: Never Used  Vaping Use  . Vaping Use: Never used  Substance Use Topics  . Alcohol use: No    Alcohol/week: 0.0 standard drinks  . Drug use: No   No Known Allergies     Medications: Outpatient Medications Prior to Visit  Medication Sig  . albuterol (VENTOLIN HFA) 108 (90 Base) MCG/ACT inhaler Inhale 2 puffs into the lungs every 6 (six) hours as needed for wheezing or shortness of breath.  . Albuterol Sulfate 2.5 MG/0.5ML NEBU Inhale  into the lungs.  Marland Kitchen amLODipine (NORVASC) 5 MG tablet Take 1 tablet (5 mg total) by mouth daily.  Marland Kitchen aspirin 81 MG tablet Take 81 mg by mouth daily.  . benazepril-hydrochlorthiazide (LOTENSIN HCT) 20-12.5 MG tablet Take 1 tablet by mouth 2 (two) times daily.  . budesonide-formoterol (SYMBICORT) 160-4.5 MCG/ACT inhaler Inhale 2 puffs into the lungs in the morning and at bedtime.  Marland Kitchen CALCIUM CARBONATE-VIT D-MIN PO Take 1 tablet by mouth daily.   . furosemide (LASIX) 20 MG tablet Take 1 tablet (20 mg total) by mouth daily as needed for fluid or edema.  Marland Kitchen L-Lysine 500 MG CAPS Take by mouth. Reported on 10/15/2015  . Menthol-Methyl Salicylate (MUSCLE RUB EX) Apply 1 application topically daily as needed.   . metoprolol succinate (TOPROL-XL) 50 MG 24 hr tablet TAKE ONE TABLET BY MOUTH ONCE DAILY WITH OR IMMEDIATELY FOLLOWING A MEAL  . Multiple Vitamin (MULTIVITAMIN) capsule Take 1 capsule by mouth daily.  Marland Kitchen nystatin cream (MYCOSTATIN) Apply 1 application topically 2 (two) times daily.  . Omega-3 Fatty Acids (FISH OIL) 1000 MG CAPS Take 1 capsule by mouth daily.  . potassium chloride SA (KLOR-CON) 20 MEQ tablet TAKE ONE TABLET BY MOUTH ONCE DAILY AS NEEDED with furosemide  . simvastatin (ZOCOR) 20 MG tablet Take 1 tablet (20 mg total) by mouth at bedtime.  Marland Kitchen Spacer/Aero-Holding Chambers (AEROCHAMBER MINI CHAMBER) DEVI Use spacer with inhalers.   No facility-administered medications prior to visit.    Review of Systems  Constitutional: Negative.   Eyes: Negative for visual disturbance.  Respiratory: Positive for cough, shortness of breath and  wheezing. Negative for chest tightness.   Cardiovascular: Negative for chest pain and palpitations.  Gastrointestinal: Negative for nausea and vomiting.  Neurological: Positive for dizziness.    Last CBC Lab Results  Component Value Date   WBC 6.3 09/23/2019   HGB 15.5 09/23/2019   HCT 45.6 09/23/2019   MCV 95 09/23/2019   MCH 32.2 09/23/2019   RDW 12.3  09/23/2019   PLT 137 (L) 24/58/0998   Last metabolic panel Lab Results  Component Value Date   GLUCOSE 124 (H) 09/23/2019   NA 145 (H) 09/23/2019   K 3.6 09/23/2019   CL 102 09/23/2019   CO2 26 09/23/2019   BUN 16 09/23/2019   CREATININE 0.85 09/23/2019   GFRNONAA 62 09/23/2019   GFRAA 72 09/23/2019   CALCIUM 10.1 09/23/2019   PROT 6.8 09/23/2019   ALBUMIN 4.4 09/23/2019   LABGLOB 2.4 09/23/2019   AGRATIO 1.8 09/23/2019   BILITOT 0.9 09/23/2019   ALKPHOS 95 09/23/2019   AST 35 09/23/2019   ALT 23 09/23/2019   ANIONGAP 12 07/25/2017      Objective    BP (!) 120/58 (BP Location: Left Arm, Patient Position: Sitting, Cuff Size: Normal)   Pulse 74   Resp 16   Wt 156 lb 11.2 oz (71.1 kg)   SpO2 95%   BMI 26.90 kg/m     BP Readings from Last 3 Encounters:  05/13/20 (!) 120/58  05/06/20 104/82  09/20/19 (!) 150/86   Wt Readings from Last 3 Encounters:  05/13/20 156 lb 11.2 oz (71.1 kg)  05/06/20 154 lb 6.4 oz (70 kg)  09/20/19 151 lb 12.8 oz (68.9 kg)      Physical Exam Vitals reviewed.  Constitutional:      General: She is not in acute distress.    Appearance: Normal appearance. She is well-developed. She is not ill-appearing or diaphoretic.  Neck:     Thyroid: No thyromegaly.     Vascular: No JVD.     Trachea: No tracheal deviation.  Cardiovascular:     Rate and Rhythm: Normal rate and regular rhythm.     Heart sounds: Normal heart sounds. No murmur heard.  No friction rub. No gallop.   Pulmonary:     Effort: Pulmonary effort is normal. No respiratory distress.     Breath sounds: Decreased air movement present. Decreased breath sounds present. No wheezing or rales.  Musculoskeletal:     Cervical back: Normal range of motion and neck supple.  Lymphadenopathy:     Cervical: No cervical adenopathy.  Neurological:     Mental Status: She is alert.      No results found for any visits on 05/13/20.  Assessment & Plan     1. Essential  hypertension Slight hypotensive episodes, patient reports some mild dizziness with standing. Had been previously advised to decrease amlodipine to 2.5mg , however, this has not been done yet. This would be an appropriate treatment to decrease to amlodipine 2.5mg . Continue Benzaepril-HCTZ 20-12.5mg  and metoprolol XL 50mg  daily. F/U in 4 weeks.   2. Hypercholesteremia Stable. Continue Simvastatin 20mg .   3. Blood glucose elevated Stable. Diet controlled.   4. COPD exacerbation ([place]) Has been having increased SOB, improved some today. Has also been checking O2 sats when she awakens has been low (87-88). CXR ordered due to decreased breath sounds. Will give sample of Trelegy to try to see if this helps. Stop Symbicort while trying the Trelegy. May continue Albuterol nebulizer as needed. Overnight sleep oximetry testing ordered  to evaluate to see if patient requires overnight oxygen. Will f/u pending results.  - DG Chest 2 View; Future - Home sleep test   No follow-ups on file.      Reynolds Bowl, PA-C, have reviewed all documentation for this visit. The documentation on 05/14/20 for the exam, diagnosis, procedures, and orders are all accurate and complete.   Rubye Beach  [facility] [phone] (phone) [phone] (fax)  [place]

## 2020-05-13 NOTE — Patient Instructions (Signed)
Fluticasone; Umeclidinium; Vilanterol inhalation powder What is this medicine? FLUTICASONE; UMECLIDINIUM; VILANTEROL (floo TIK a sone; ue MEK li DIN ee um; vye LAN ter ol) inhalation is a combination of 3 drugs to treat COPD and asthma. Umeclidinium and Vilanterol are bronchodilators that help keep airways open. Fluticasone decreases inflammation in the lungs. Do not use this drug combination for acute asthma attacks or bronchospasm. This medicine may be used for other purposes; ask your health care provider or pharmacist if you have questions. COMMON BRAND NAME(S): TRELEGY ELLIPTA What should I tell my health care provider before I take this medicine? They need to know if you have any of these conditions:  bone problems  diabetes  eye disease, vision problems  heart disease  high blood pressure  history of irregular heartbeat  immune system problems  infection  kidney disease  pheochromocytoma  prostate disease  seizures  thyroid disease  trouble passing urine  an unusual or allergic reaction to fluticasone, umeclidinium, vilanterol, lactose, milk proteins, other medicines, foods, dyes, or preservatives  pregnant or trying to get pregnant  breast-feeding How should I use this medicine? This drug is inhaled through the mouth. Rinse your mouth with water after use. Make sure not to swallow the water. Take it as directed on the prescription label at the same time every day. Do not use it more often than directed. A special MedGuide will be given to you by the pharmacist with each prescription and refill. Be sure to read this information carefully each time. Talk to your pediatrician about the use of this drug in children. Special care may be needed. Overdosage: If you think you have taken too much of this medicine contact a poison control center or emergency room at once. NOTE: This medicine is only for you. Do not share this medicine with others. What if I miss a  dose? If you miss a dose, take it as soon as you can. If it is almost time for your next dose, take only that dose. Do not take double or extra doses. What may interact with this medicine? Do not take this medicine with any of the following medications:  cisapride  dofetilide  dronedarone  MAOIs like Carbex, Eldepryl, Marplan, Nardil, and Parnate  pimozide  thioridazine  ziprasidone This medicine may also interact with the following medications:  aclidinium  antihistamines for allergy  antiviral medicines for HIV or AIDS  atropine  beta-blockers like metoprolol and propranolol  certain antibiotics like clarithromycin and telithromycin  certain medicines for bladder problems like oxybutynin, tolterodine  certain medicines for depression, anxiety, or psychotic disturbances  certain medicines for fungal infections like ketoconazole, itraconazole, posaconazole, voriconazole  certain medicines for Parkinson's disease like benztropine, trihexyphenidyl  certain medicines for stomach problems like dicyclomine, hyoscyamine  certain medicines for travel sickness like scopolamine  conivaptan  diuretics  ipratropium  medicines for colds  other medicines for breathing problems  other medicines that prolong the QT interval (cause an abnormal heart rhythm)  nefazodone  tiotropium This list may not describe all possible interactions. Give your health care provider a list of all the medicines, herbs, non-prescription drugs, or dietary supplements you use. Also tell them if you smoke, drink alcohol, or use illegal drugs. Some items may interact with your medicine. What should I watch for while using this medicine? Visit your doctor or health care professional for regular checkups. Tell your doctor or health care professional if your symptoms do not get better. Do not use this medicine  more than once every 24 hours. NEVER use this medicine for an acute asthma or COPD  attack. You should use your short-acting rescue inhalers for this purpose. If your symptoms get worse or if you need your short-acting inhalers more often, call your doctor right away. If you are going to have surgery tell your doctor or health care professional that you are using this medicine. Try not to come in contact with people with the chicken pox or measles. If you do, call your doctor. This medicine may increase blood sugar. Ask your healthcare provider if changes in diet or medicines are needed if you have diabetes. What side effects may I notice from receiving this medicine? Side effects that you should report to your doctor or health care professional as soon as possible:  allergic reactions like skin rash or hives, swelling of the face, lips, or tongue  breathing problems right after inhaling your medicine  chest pain  eye pain  fast, irregular heartbeat  feeling faint or lightheaded, falls  fever or chills  nausea, vomiting  signs and symptoms of high blood sugar such as being more thirsty or hungry or having to urinate more than normal. You may also feel very tired or have blurry vision.  trouble passing urine Side effects that usually do not require medical attention (report these to your doctor or health care professional if they continue or are bothersome):  back pain  changes in taste  cough  diarrhea  headache  nervousness  sore throat  tremor This list may not describe all possible side effects. Call your doctor for medical advice about side effects. You may report side effects to FDA at 1-800-FDA-1088. Where should I keep my medicine? Keep out of the reach of children and pets. Store at room temperature between 20 and 25 degrees C (68 and 77 degrees F). Keep inhaler away from extreme heat, cold or humidity. Throw away 6 weeks after removing it from the foil pouch, when the dose counter reads "0" or after the expiration date, whichever is  first. NOTE: This sheet is a summary. It may not cover all possible information. If you have questions about this medicine, talk to your doctor, pharmacist, or health care provider.  2020 Elsevier/Gold Standard (2019-05-06 12:45:04)

## 2020-05-14 ENCOUNTER — Telehealth: Payer: Self-pay | Admitting: Physician Assistant

## 2020-05-14 ENCOUNTER — Telehealth: Payer: Self-pay

## 2020-05-14 MED ORDER — PROAIR RESPICLICK 108 (90 BASE) MCG/ACT IN AEPB
1.0000 | INHALATION_SPRAY | Freq: Four times a day (QID) | RESPIRATORY_TRACT | 5 refills | Status: AC | PRN
Start: 2020-05-14 — End: ?

## 2020-05-14 NOTE — Telephone Encounter (Signed)
Please review. Thanks!  

## 2020-05-14 NOTE — Telephone Encounter (Signed)
Pharmacy called and stated that th Rx for albuterol (VENTOLIN HFA) 108 (90 Base) MCG/ACT inhaler Is not covered by insurance / they are asking for the Rx for Proair HFA instead of ventolin/ please advise

## 2020-05-14 NOTE — Telephone Encounter (Signed)
Copied from [place] [id number]. Topic: General - Other >> May 13, 2020  3:58 PM Alanda Slim E wrote: Reason for CRM: Pts daughter asked if the Pts xray results can be given to her / if office can call her due to mom not answering the phone /

## 2020-05-14 NOTE — Telephone Encounter (Signed)
Changed to proair.

## 2020-05-14 NOTE — Telephone Encounter (Signed)
Pts daughter called to inquiry about xray result / advised pt of Jennifer's message below/ please advise when results are in

## 2020-05-14 NOTE — Telephone Encounter (Signed)
Yes we will.   I have not received results yet. I ordered another xray yesterday for another patient that has also not been resulted yet. I suspect they must be short staffed reading.

## 2020-05-15 ENCOUNTER — Telehealth: Payer: Self-pay | Admitting: Physician Assistant

## 2020-05-15 ENCOUNTER — Telehealth: Payer: Self-pay

## 2020-05-15 ENCOUNTER — Telehealth: Payer: Self-pay | Admitting: *Deleted

## 2020-05-15 MED ORDER — AMOXICILLIN-POT CLAVULANATE 875-125 MG PO TABS: 1.0000 | ORAL_TABLET | Freq: Two times a day (BID) | ORAL | 0 refills | Status: DC

## 2020-05-15 NOTE — Telephone Encounter (Signed)
Patient's daughter Karena Addison advised as directed below.

## 2020-05-15 NOTE — Telephone Encounter (Signed)
Olivia Mackie, PRA from [facility] called the following report from CXR: 1. Moderate bilateral pleural effusions, with associated bibasilar volume loss/atelectasis. Right pleural effusion is similar to exam 2years ago, left pleural effusion has increased since that time. 2. Interstitial coarsening may represent pulmonary edema or bronchitic change given history of COPD. Xray read by Dr Keith Rake; the pt is seen by Fenton Malling, [facility]; report called to [place]; will route to office for notification.

## 2020-05-15 NOTE — Telephone Encounter (Signed)
Please review

## 2020-05-15 NOTE — Addendum Note (Signed)
Addended by: Mar Daring on: 05/15/2020 07:33 AM   Modules accepted: Orders

## 2020-05-15 NOTE — Telephone Encounter (Signed)
Pt  Is calling checking on the status of overnight sleep oximetry testing. Pt was seen on 05-13-2020

## 2020-05-15 NOTE — Telephone Encounter (Signed)
Written by Mar Daring, PA-C on 05/15/2020 7:32 AM EDT Seen by patient Amanda Strickland on 05/15/2020 8:14 AM

## 2020-05-15 NOTE — Telephone Encounter (Signed)
-----   Message from Mar Daring, [place] sent at 05/15/2020  7:32 AM EDT ----- CXR does show bronchitic changes c/w COPD. Also noted to have bilateral pleural effusions. She is already on Furosemide for the fluid and I will add an antibiotic for possible infectious cause.   Call Daughter with results please.

## 2020-05-15 NOTE — Telephone Encounter (Addendum)
Pt daughter DEE is calling and would like to know if her mother is on enough lasix and when does she needs to have her potassium check and to see if she is  a canidate for oral steroid

## 2020-05-15 NOTE — Telephone Encounter (Signed)
Please advise 

## 2020-05-15 NOTE — Telephone Encounter (Signed)
Already have seen and sent message to daughter

## 2020-05-15 NOTE — Telephone Encounter (Signed)
Her potassium had been normal. Her labs are not due until March.  If she continues to worsen we can check things sooner.   She should take the furosemide 20mg  daily for next 5 days for pleural effusions then can decrease back to just as needed.  We can use an oral steroid if she is worsening or not improving despite the inhalers. The inhalers do have inhaled steroid. Symptoms should improve with the antibiotic.   We can repeat CXR 2 weeks after she completes the antibiotic.

## 2020-05-18 NOTE — Telephone Encounter (Signed)
It has been ordered on 05/13/20  Judson Roch, do you know the status?

## 2020-05-19 NOTE — Telephone Encounter (Signed)
Please review. Thanks!  

## 2020-05-19 NOTE — Telephone Encounter (Signed)
I do not do anything with the orders for overnight oximetry. I think places like [employer] [facility] can do them or if pt has any medical equipment like cpap it can be done through that company

## 2020-05-20 NOTE — Telephone Encounter (Signed)
Written order completed and will be faxed to [facility].

## 2020-05-22 ENCOUNTER — Telehealth: Payer: Self-pay

## 2020-05-22 MED ORDER — PREDNISONE 10 MG PO TABS: ORAL_TABLET | ORAL | 0 refills | Status: DC

## 2020-05-22 NOTE — Telephone Encounter (Signed)
Prednisone sent to [facility]

## 2020-05-22 NOTE — Telephone Encounter (Signed)
Copied from [place] [id number]. Topic: General - Other >> May 22, 2020 10:27 AM Rainey Pines A wrote: Patients poa called and stated that Patient isnt  any bettter from her copd and Patient is using nebulizer every few hours and her edema is no worse. She was advised to call [facility] for an  Oral steroid if there isnt any significant improvement and wants to know if this can be called in today. Patient is almost done with oral antibiotic and has no fever. Karena Addison is requesting a callback today as soon as possible.  Best contact [phone]

## 2020-05-22 NOTE — Telephone Encounter (Signed)
Dee advised.   Thanks,   -Mickel Baas

## 2020-05-26 DIAGNOSIS — J449 Chronic obstructive pulmonary disease, unspecified: Secondary | ICD-10-CM | POA: Diagnosis not present

## 2020-05-28 DIAGNOSIS — G8929 Other chronic pain: Secondary | ICD-10-CM | POA: Diagnosis not present

## 2020-05-28 DIAGNOSIS — R0602 Shortness of breath: Secondary | ICD-10-CM | POA: Insufficient documentation

## 2020-05-28 DIAGNOSIS — M25511 Pain in right shoulder: Secondary | ICD-10-CM | POA: Diagnosis not present

## 2020-05-28 DIAGNOSIS — I159 Secondary hypertension, unspecified: Secondary | ICD-10-CM | POA: Diagnosis not present

## 2020-05-28 DIAGNOSIS — E7800 Pure hypercholesterolemia, unspecified: Secondary | ICD-10-CM | POA: Diagnosis not present

## 2020-05-28 DIAGNOSIS — J9601 Acute respiratory failure with hypoxia: Secondary | ICD-10-CM | POA: Diagnosis not present

## 2020-05-28 DIAGNOSIS — J449 Chronic obstructive pulmonary disease, unspecified: Secondary | ICD-10-CM | POA: Diagnosis not present

## 2020-05-28 DIAGNOSIS — I25708 Atherosclerosis of coronary artery bypass graft(s), unspecified, with other forms of angina pectoris: Secondary | ICD-10-CM | POA: Diagnosis not present

## 2020-05-28 DIAGNOSIS — I10 Essential (primary) hypertension: Secondary | ICD-10-CM | POA: Diagnosis not present

## 2020-05-28 DIAGNOSIS — I429 Cardiomyopathy, unspecified: Secondary | ICD-10-CM | POA: Diagnosis not present

## 2020-06-10 ENCOUNTER — Ambulatory Visit: Payer: Self-pay | Admitting: Physician Assistant

## 2020-06-10 DIAGNOSIS — G8929 Other chronic pain: Secondary | ICD-10-CM | POA: Diagnosis not present

## 2020-06-10 DIAGNOSIS — E7800 Pure hypercholesterolemia, unspecified: Secondary | ICD-10-CM | POA: Diagnosis not present

## 2020-06-10 DIAGNOSIS — R0602 Shortness of breath: Secondary | ICD-10-CM | POA: Diagnosis not present

## 2020-06-10 DIAGNOSIS — I25708 Atherosclerosis of coronary artery bypass graft(s), unspecified, with other forms of angina pectoris: Secondary | ICD-10-CM | POA: Diagnosis not present

## 2020-06-10 DIAGNOSIS — I5021 Acute systolic (congestive) heart failure: Secondary | ICD-10-CM | POA: Diagnosis not present

## 2020-06-10 DIAGNOSIS — M25511 Pain in right shoulder: Secondary | ICD-10-CM | POA: Diagnosis not present

## 2020-06-17 ENCOUNTER — Other Ambulatory Visit: Payer: Self-pay | Admitting: Physician Assistant

## 2020-06-17 DIAGNOSIS — R0989 Other specified symptoms and signs involving the circulatory and respiratory systems: Secondary | ICD-10-CM

## 2020-06-17 DIAGNOSIS — J441 Chronic obstructive pulmonary disease with (acute) exacerbation: Secondary | ICD-10-CM

## 2020-06-17 NOTE — Telephone Encounter (Signed)
Requested medication (s) are due for refill today -no  Requested medication (s) are on the active medication list -no  Future visit scheduled -no  Last refill: medication no longer current  Notes to clinic: Request RF medication discontinued in office- patient reported not using- sent for review of request  Requested Prescriptions  Pending Prescriptions Disp Refills   ipratropium-albuterol (DUONEB) 0.5-2.5 (3) MG/3ML SOLN [Pharmacy Med Name: ipratropium 0.5 mg-albuterol 3 mg (2.5 mg base)/3 mL nebulization soln] 360 mL 0    Sig: USE 1 VIAL VIA NEBULIZER AND INHALE BY MOUTH EVERY 6 HOURS AS NEEDED      Pulmonology:  Combination Products Passed - 06/17/2020  2:51 PM      Passed - Valid encounter within last 12 months    Recent Outpatient Visits           1 month ago Essential hypertension   [place], Clearnce Sorrel, PA-C   1 month ago Primary hypertension   [facility] Carles Collet M, PA-C   9 months ago Annual physical exam   [place], [place], [place]   1 year ago Chronic diastolic heart failure [facility])   [place], [place], [place]   2 years ago Chronic obstructive pulmonary disease with acute lower respiratory infection ([place])   [place], [place]                  Requested Prescriptions  Pending Prescriptions Disp Refills   ipratropium-albuterol (DUONEB) 0.5-2.5 (3) MG/3ML SOLN [Pharmacy Med Name: ipratropium 0.5 mg-albuterol 3 mg (2.5 mg base)/3 mL nebulization soln] 360 mL 0    Sig: USE 1 VIAL VIA NEBULIZER AND INHALE BY MOUTH EVERY 6 HOURS AS NEEDED      Pulmonology:  Combination Products Passed - 06/17/2020  2:51 PM      Passed - Valid encounter within last 12 months    Recent Outpatient Visits           1 month ago Essential hypertension   [place], Clearnce Sorrel, [place]   1 month ago Primary hypertension    [facility] Carles Collet M, [place]   9 months ago Annual physical exam   [place], [place], [place]   1 year ago Chronic diastolic heart failure [facility])   [place], [place], [place]   2 years ago Chronic obstructive pulmonary disease with acute lower respiratory infection [facility])   [place], Vickki Muff, [place]

## 2020-06-27 DIAGNOSIS — J9601 Acute respiratory failure with hypoxia: Secondary | ICD-10-CM | POA: Diagnosis not present

## 2020-06-27 DIAGNOSIS — J449 Chronic obstructive pulmonary disease, unspecified: Secondary | ICD-10-CM | POA: Diagnosis not present

## 2020-06-30 ENCOUNTER — Encounter: Payer: Self-pay | Admitting: Pulmonary Disease

## 2020-06-30 ENCOUNTER — Other Ambulatory Visit: Payer: Self-pay

## 2020-06-30 ENCOUNTER — Ambulatory Visit (INDEPENDENT_AMBULATORY_CARE_PROVIDER_SITE_OTHER): Payer: PPO

## 2020-06-30 ENCOUNTER — Other Ambulatory Visit: Payer: Self-pay | Admitting: Pulmonary Disease

## 2020-06-30 ENCOUNTER — Ambulatory Visit: Payer: PPO | Admitting: Pulmonary Disease

## 2020-06-30 VITALS — BP 116/72 | HR 77 | Temp 98.1°F | Ht 64.0 in | Wt 147.0 lb

## 2020-06-30 DIAGNOSIS — R0600 Dyspnea, unspecified: Secondary | ICD-10-CM

## 2020-06-30 DIAGNOSIS — J90 Pleural effusion, not elsewhere classified: Secondary | ICD-10-CM | POA: Diagnosis not present

## 2020-06-30 DIAGNOSIS — R0609 Other forms of dyspnea: Secondary | ICD-10-CM

## 2020-06-30 MED ORDER — BREZTRI AEROSPHERE 160-9-4.8 MCG/ACT IN AERO: 2.0000 | INHALATION_SPRAY | Freq: Two times a day (BID) | RESPIRATORY_TRACT | 0 refills | Status: DC

## 2020-06-30 MED ORDER — BREZTRI AEROSPHERE 160-9-4.8 MCG/ACT IN AERO: 2.0000 | INHALATION_SPRAY | Freq: Two times a day (BID) | RESPIRATORY_TRACT | 11 refills | Status: AC

## 2020-06-30 NOTE — Patient Instructions (Addendum)
Nice to meet you  Use breztri 2 puffs twice a day - this is to replace Symbicort.  You can stop Symbicort.  We will get a chest x-ray today, at 1 to make sure the fluid is minimal on the lungs  We will get spirometry pre and post albuterol (30 mins) in the coming weeks at your convenience, I will ask them to be scheduled in [place]  Return to clinic in 3 months with Dr. Silas Flood

## 2020-06-30 NOTE — Addendum Note (Signed)
Addended by: Valerie Salts on: 06/30/2020 03:04 PM   Modules accepted: Orders

## 2020-06-30 NOTE — Progress Notes (Signed)
@Patient  ID: Amanda Strickland, female    DOB: 10/26/1930, 86 y.o.   MRN: [id number]  Chief Complaint  Patient presents with   Consult    Referred by PCP and Cardiology for SOB. Had fluid around her heart and lungs. Sleeps with 2L of O2.     Referring provider: Teodoro Spray, MD  HPI:   86 year old whom we are seeing in consultation at the request of PCP and cardiologist for evaluation of dyspnea on exertion.  PCP notes reviewed.  Notes worsening shortness of breath over the last several months.  Particular last 2 months or so.  Recounts, cyclical worsening dyspnea on exertion every few years.  Over the last decade or so these but largely attributed to COPD with exacerbations.  She received steroids and seemed to help.  She had a bad patch 2 years ago where she received 3 courses of prednisone over the course of the year.  Often these present with like recurrent bronchitis, cough.  This time dyspnea was worse but not as consistent with prior symptoms.  Work-up included chest x-ray 05/2020 which on my interpretation shows bilateral pleural effusions, congested central vasculature.  Chest x-ray 07/2017 showed left-sided pleural effusion, mild interstitial prominence consistent with pulmonary edema on my interpretation.  She was started on Lasix.  Reportedly lost 14 pounds of water weight.  Dyspnea exertion is somewhat better.  Also resume Symbicort for her history of "COPD."  This was diagnosed by pulmonologist 10+ years ago in [place].  Daughter started she had spirometry but does not have results.  Cannot see results via EMR.  With resumption of Symbicort DOE has shown mild improvement.  No clear exacerbating or alleviating factors.  No timing there today where her breathing is better or worse.  Description of activities of daily living: Significant increased work of breathing whereas before she was quite active, plays with grandchildren, active in her community with [employer].  PMH: Recurrent pulmonary infections in early adulthood, hypertension, reportedly COPD, CAD Surgical history: CABG 20+ years Family history: CAD in father Social history: Never smoker, lives in [place], [place] / Pulmonary Flowsheets:   ACT:  No flowsheet data found.  MMRC: No flowsheet data found.  Epworth:  No flowsheet data found.  Tests:   FENO:  No results found for: NITRICOXIDE  PFT: No flowsheet data found.  WALK:  No flowsheet data found.  Imaging: Personally reviewed, as per EMR discussion this note  Lab Results: Personally reviewed, notably absolute eosinophils 400 09/2019 begging the question of eosinophilic bronchial disease/asthma CBC    Component Value Date/Time   WBC 6.3 09/23/2019 0924   WBC 7.6 07/25/2017 0417   RBC 4.82 09/23/2019 0924   RBC 4.41 07/25/2017 0417   HGB 15.5 09/23/2019 0924   HCT 45.6 09/23/2019 0924   PLT 137 (L) 09/23/2019 0924   MCV 95 09/23/2019 0924   MCV 92 01/14/2014 1502   MCH 32.2 09/23/2019 0924   MCH 32.4 07/25/2017 0417   MCHC 34.0 09/23/2019 0924   MCHC 34.7 07/25/2017 0417   RDW 12.3 09/23/2019 0924   RDW 13.0 01/14/2014 1502   LYMPHSABS 1.8 09/23/2019 0924   MONOABS 0.7 07/24/2017 1235   EOSABS 0.4 09/23/2019 0924   BASOSABS 0.1 09/23/2019 0924    BMET    Component Value Date/Time   NA 145 (H) 09/23/2019 0924   NA 140 01/15/2014 0505   K 3.6 09/23/2019 0924   K 2.9 (L)  01/15/2014 0505   CL 102 09/23/2019 0924   CL 102 01/15/2014 0505   CO2 26 09/23/2019 0924   CO2 29 01/15/2014 0505   GLUCOSE 124 (H) 09/23/2019 0924   GLUCOSE 118 (H) 07/25/2017 0417   GLUCOSE 122 (H) 01/15/2014 0505   BUN 16 09/23/2019 0924   BUN 12 01/15/2014 0505   CREATININE 0.85 09/23/2019 0924   CREATININE 0.91 (H) 04/24/2017 1424   CALCIUM 10.1 09/23/2019 0924   CALCIUM 8.9 01/15/2014 0505   GFRNONAA 62 09/23/2019 0924   GFRNONAA 58 (L) 04/24/2017 1424   GFRAA 72 09/23/2019 0924    GFRAA 68 04/24/2017 1424    BNP    Component Value Date/Time   BNP 531.0 (H) 07/24/2017 1235    ProBNP No results found for: PROBNP  Specialty Problems      Pulmonary Problems   COPD (chronic obstructive pulmonary disease) (HCC)   Acute respiratory failure with hypoxia (HCC)      No Known Allergies  Immunization History  Administered Date(s) Administered   Fluad Quad(high Dose 65+) 04/08/2019, 04/16/2020   Influenza Split 03/30/2009, 04/06/2010   Influenza, High Dose Seasonal PF 05/12/2014, 05/19/2016, 04/24/2017, 04/03/2018   Influenza,inj,Quad PF,6+ Mos 05/12/2015   PFIZER SARS-COV-2 Vaccination 07/22/2019, 08/12/2019, 04/06/2020   Pneumococcal Conjugate-13 05/12/2014   Pneumococcal Polysaccharide-23 10/20/2016   Td 09/13/2012   Tdap 09/13/2012    Past Medical History:  Diagnosis Date   Arthritis    CAD (coronary artery disease)    CHF (congestive heart failure) (HCC)    diastolic heart failure   Depression    Heart attack ([place]) 1994   Hypertension     Tobacco History: Social History   Tobacco Use  Smoking Status Never Smoker  Smokeless Tobacco Never Used   Counseling given: Not Answered   Continue to not smoke  Outpatient Encounter Medications as of 06/30/2020  Medication Sig   Albuterol Sulfate (PROAIR RESPICLICK) 161 (90 Base) MCG/ACT AEPB Inhale 1-2 puffs into the lungs every 6 (six) hours as needed.   Albuterol Sulfate 2.5 MG/0.5ML NEBU Inhale into the lungs.   amLODipine (NORVASC) 5 MG tablet Take 1 tablet (5 mg total) by mouth daily.   amoxicillin-clavulanate (AUGMENTIN) 875-125 MG tablet Take 1 tablet by mouth 2 (two) times daily.   aspirin 81 MG tablet Take 81 mg by mouth daily.   benazepril-hydrochlorthiazide (LOTENSIN HCT) 20-12.5 MG tablet Take 1 tablet by mouth 2 (two) times daily.   CALCIUM CARBONATE-VIT D-MIN PO Take 1 tablet by mouth daily.    furosemide (LASIX) 20 MG tablet Take 1 tablet (20 mg total) by  mouth daily as needed for fluid or edema.   ipratropium-albuterol (DUONEB) 0.5-2.5 (3) MG/3ML SOLN USE 1 VIAL VIA NEBULIZER AND INHALE BY MOUTH EVERY 6 HOURS AS NEEDED   L-Lysine 500 MG CAPS Take by mouth. Reported on 10/15/2015   Menthol-Methyl Salicylate (MUSCLE RUB EX) Apply 1 application topically daily as needed.    metoprolol succinate (TOPROL-XL) 50 MG 24 hr tablet TAKE ONE TABLET BY MOUTH ONCE DAILY WITH OR IMMEDIATELY FOLLOWING A MEAL   Multiple Vitamin (MULTIVITAMIN) capsule Take 1 capsule by mouth daily.   nystatin cream (MYCOSTATIN) Apply 1 application topically 2 (two) times daily.   Omega-3 Fatty Acids (FISH OIL) 1000 MG CAPS Take 1 capsule by mouth daily.   potassium chloride SA (KLOR-CON) 20 MEQ tablet TAKE ONE TABLET BY MOUTH ONCE DAILY AS NEEDED with furosemide   predniSONE (DELTASONE) 10 MG tablet Take 6 tablets PO on  day 1 and day 2, take 5 tablets PO on day 3 and day 4, take 4 tablets PO on day 5 and day 6, take 3 tablets PO on day 7 and day 8, take 2 tablets PO on day 9 and day 10, take one tablet PO on day 11 and day 12.   simvastatin (ZOCOR) 20 MG tablet Take 1 tablet (20 mg total) by mouth at bedtime.   Spacer/Aero-Holding Chambers (AEROCHAMBER MINI CHAMBER) DEVI Use spacer with inhalers.   [DISCONTINUED] budesonide-formoterol (SYMBICORT) 160-4.5 MCG/ACT inhaler Inhale 2 puffs into the lungs in the morning and at bedtime.   Budeson-Glycopyrrol-Formoterol (BREZTRI AEROSPHERE) 160-9-4.8 MCG/ACT AERO Inhale 2 puffs into the lungs in the morning and at bedtime.   No facility-administered encounter medications on file as of 06/30/2020.     Review of Systems  Review of Systems  No chest pain with exertion.  Endorses loud snoring, startling awake at night.  Urinary frequency at night.  Comprehensive review of systems otherwise negative. Physical Exam  BP 116/72    Pulse 77    Temp 98.1 F (36.7 C) (Temporal)    Ht 5\' 4"  (1.626 m)    Wt 147 lb (66.7 kg)    SpO2  98% Comment: on RA   BMI 25.23 kg/m   Wt Readings from Last 5 Encounters:  06/30/20 147 lb (66.7 kg)  05/13/20 156 lb 11.2 oz (71.1 kg)  05/06/20 154 lb 6.4 oz (70 kg)  09/20/19 151 lb 12.8 oz (68.9 kg)  01/23/19 156 lb (70.8 kg)    BMI Readings from Last 5 Encounters:  06/30/20 25.23 kg/m  05/13/20 26.90 kg/m  05/06/20 26.50 kg/m  09/20/19 26.06 kg/m  01/23/19 25.96 kg/m     Physical Exam General: Elderly, no acute distress Eyes: EOMI, no icterus Neck: Supple, no JVP appreciated Respiratory: Diminished at left base, otherwise clear to auscultate bilaterally, no crackles, no wheezing Cardiovascular: Regular rhythm, no murmurs appreciated Abdomen: Nontender, bowel sounds present MSK: No synovitis, no joint effusion Neuro: Normal gait, no weakness, hard of hearing Psych: Normal mood, full affect   Assessment & Plan:   Dyspnea on exertion: Likely multifactorial.  Combination of structural heart disease, volume overload (hopefully this is improving), deconditioning, likely contribution of some pulmonary issue-COPD versus asthma.  Mild improvement with diuretics and Symbicort.  Given she carries a diagnosis of COPD presumably based on spirometry, her uncontrolled symptoms, and her use of prednisone over the last 24 to 36 months, recommend she increase Symbicort to triple therapy Breztri.  She is amenable to this.  Will obtain chest x-ray today.  Spirometry pre-/post ordered to be scheduled in the coming days at her convenience. --Consider pulmonary rehab in the future once medically optimized  Bilateral pleural effusions: Seen on recent chest x-ray.  Most likely related to left-sided structural disease, valvular abnormalities.  Indicative of volume overload.  She has been taking Lasix daily and increased dose over the last few weeks.  Reports 14 pound weight loss of water weight.  Optimistic this will be improved.  We will repeat chest imaging to document this.  Nocturnal  hypoxemia: As documented by overnight oximetry per daughter's report.  Wearing nasal cannula at night and oxygen saturations are better in the morning.  Suspect OSA given reported loud snoring, startling awake.  Not amenable to CPAP therapy.  This is reasonable given her age.  Continue nocturnal oxygen.  Return in about 3 months (around 09/28/2020).   Lanier Clam, MD 06/30/2020

## 2020-07-01 ENCOUNTER — Encounter: Payer: Self-pay | Admitting: *Deleted

## 2020-07-01 ENCOUNTER — Telehealth: Payer: Self-pay | Admitting: Pulmonary Disease

## 2020-07-01 NOTE — Telephone Encounter (Deleted)
I have called and spoke with Shanon Brow at [facility] medical   Phone:  [phone] Fax #  [phone]  I have faxed over the last OV note and the qualifying walk that was done for her oxygen.  Nothing further is needed.

## 2020-07-01 NOTE — Progress Notes (Signed)
CXR normal, prior effusions are gone.

## 2020-07-01 NOTE — Telephone Encounter (Signed)
Didn't know to send this to [facility] or not? Saw [place] but is having PFT at [facility] -sorry

## 2020-07-02 NOTE — Telephone Encounter (Signed)
I called pt's dtr & left her a vm to call me for pft appt info.

## 2020-07-06 DIAGNOSIS — E7800 Pure hypercholesterolemia, unspecified: Secondary | ICD-10-CM | POA: Diagnosis not present

## 2020-07-06 DIAGNOSIS — I159 Secondary hypertension, unspecified: Secondary | ICD-10-CM | POA: Diagnosis not present

## 2020-07-06 DIAGNOSIS — I25708 Atherosclerosis of coronary artery bypass graft(s), unspecified, with other forms of angina pectoris: Secondary | ICD-10-CM | POA: Diagnosis not present

## 2020-07-06 DIAGNOSIS — I5020 Unspecified systolic (congestive) heart failure: Secondary | ICD-10-CM | POA: Insufficient documentation

## 2020-07-07 NOTE — Telephone Encounter (Signed)
Called & spoke to pt's dtr about pft.  She did see the appt info in MyChart.  Nothing further needed.

## 2020-07-11 DIAGNOSIS — J9601 Acute respiratory failure with hypoxia: Secondary | ICD-10-CM | POA: Diagnosis not present

## 2020-07-11 DIAGNOSIS — J449 Chronic obstructive pulmonary disease, unspecified: Secondary | ICD-10-CM | POA: Diagnosis not present

## 2020-07-28 ENCOUNTER — Other Ambulatory Visit
Admission: RE | Admit: 2020-07-28 | Discharge: 2020-07-28 | Disposition: A | Discharge status: Home / Self Care | Payer: PPO | Source: Ambulatory Visit | Attending: Pulmonary Disease | Admitting: Pulmonary Disease

## 2020-07-28 ENCOUNTER — Other Ambulatory Visit: Payer: Self-pay

## 2020-07-28 DIAGNOSIS — Z01812 Encounter for preprocedural laboratory examination: Secondary | ICD-10-CM | POA: Insufficient documentation

## 2020-07-28 DIAGNOSIS — J9601 Acute respiratory failure with hypoxia: Secondary | ICD-10-CM | POA: Diagnosis not present

## 2020-07-28 DIAGNOSIS — J449 Chronic obstructive pulmonary disease, unspecified: Secondary | ICD-10-CM | POA: Diagnosis not present

## 2020-07-28 DIAGNOSIS — Z20822 Contact with and (suspected) exposure to covid-19: Secondary | ICD-10-CM | POA: Diagnosis not present

## 2020-07-28 LAB — SARS CORONAVIRUS 2 (TAT 6-24 HRS): SARS Coronavirus 2: NEGATIVE

## 2020-07-29 ENCOUNTER — Other Ambulatory Visit: Payer: Self-pay

## 2020-07-29 ENCOUNTER — Ambulatory Visit: Payer: PPO | Attending: Pulmonary Disease

## 2020-07-29 DIAGNOSIS — R0600 Dyspnea, unspecified: Secondary | ICD-10-CM | POA: Insufficient documentation

## 2020-07-29 DIAGNOSIS — R0609 Other forms of dyspnea: Secondary | ICD-10-CM

## 2020-07-29 DIAGNOSIS — J984 Other disorders of lung: Secondary | ICD-10-CM | POA: Diagnosis not present

## 2020-07-29 LAB — PULMONARY FUNCTION TEST ARMC ONLY
DL/VA % pred: 80 %
DL/VA: 3.26 ml/min/mmHg/L
DLCO unc % pred: 56 %
DLCO unc: 10.33 ml/min/mmHg
FEF 25-75 Post: 2.28 L/sec
FEF 25-75 Pre: 1.73 L/sec
FEF2575-%Change-Post: 31 %
FEF2575-%Pred-Post: 218 %
FEF2575-%Pred-Pre: 166 %
FEV1-%Change-Post: 8 %
FEV1-%Pred-Post: 100 %
FEV1-%Pred-Pre: 93 %
FEV1-Post: 1.7 L
FEV1-Pre: 1.58 L
FEV1FVC-%Change-Post: 6 %
FEV1FVC-%Pred-Pre: 111 %
FEV6-%Change-Post: 1 %
FEV6-%Pred-Post: 92 %
FEV6-%Pred-Pre: 91 %
FEV6-Post: 1.99 L
FEV6-Pre: 1.96 L
FEV6FVC-%Pred-Post: 106 %
FEV6FVC-%Pred-Pre: 106 %
FVC-%Change-Post: 1 %
FVC-%Pred-Post: 86 %
FVC-%Pred-Pre: 85 %
FVC-Post: 1.99 L
Post FEV1/FVC ratio: 86 %
Post FEV6/FVC ratio: 100 %
Pre FEV1/FVC ratio: 81 %
Pre FEV6/FVC Ratio: 100 %
RV % pred: 69 %
RV: 1.77 L
TLC % pred: 73 %
TLC: 3.73 L

## 2020-07-29 MED ORDER — ALBUTEROL SULFATE (2.5 MG/3ML) 0.083% IN NEBU
2.5000 mg | INHALATION_SOLUTION | Freq: Once | RESPIRATORY_TRACT | Status: AC
  Administered 2020-07-29: 2.5 mg via RESPIRATORY_TRACT
  Filled 2020-07-29: qty 3

## 2020-08-28 DIAGNOSIS — J9601 Acute respiratory failure with hypoxia: Secondary | ICD-10-CM | POA: Diagnosis not present

## 2020-08-28 DIAGNOSIS — J449 Chronic obstructive pulmonary disease, unspecified: Secondary | ICD-10-CM | POA: Diagnosis not present

## 2020-09-07 DIAGNOSIS — I5020 Unspecified systolic (congestive) heart failure: Secondary | ICD-10-CM | POA: Diagnosis not present

## 2020-09-07 DIAGNOSIS — I159 Secondary hypertension, unspecified: Secondary | ICD-10-CM | POA: Diagnosis not present

## 2020-09-07 DIAGNOSIS — E7800 Pure hypercholesterolemia, unspecified: Secondary | ICD-10-CM | POA: Diagnosis not present

## 2020-09-07 DIAGNOSIS — I25708 Atherosclerosis of coronary artery bypass graft(s), unspecified, with other forms of angina pectoris: Secondary | ICD-10-CM | POA: Diagnosis not present

## 2020-09-07 DIAGNOSIS — R0602 Shortness of breath: Secondary | ICD-10-CM | POA: Diagnosis not present

## 2020-09-08 ENCOUNTER — Other Ambulatory Visit: Payer: Self-pay | Admitting: Physician Assistant

## 2020-09-08 DIAGNOSIS — J441 Chronic obstructive pulmonary disease with (acute) exacerbation: Secondary | ICD-10-CM

## 2020-09-08 DIAGNOSIS — E7800 Pure hypercholesterolemia, unspecified: Secondary | ICD-10-CM

## 2020-09-08 DIAGNOSIS — R0989 Other specified symptoms and signs involving the circulatory and respiratory systems: Secondary | ICD-10-CM

## 2020-09-08 DIAGNOSIS — I10 Essential (primary) hypertension: Secondary | ICD-10-CM

## 2020-09-08 NOTE — Telephone Encounter (Signed)
Future visit 09/21/20

## 2020-09-08 NOTE — Telephone Encounter (Signed)
Requested Prescriptions  Pending Prescriptions Disp Refills  . ipratropium-albuterol (DUONEB) 0.5-2.5 (3) MG/3ML SOLN [Pharmacy Med Name: ipratropium 0.5 mg-albuterol 3 mg (2.5 mg base)/3 mL nebulization soln] 360 mL 0    Sig: USE 1 VIAL VIA NEBULIZER AND INHALE BY MOUTH EVERY 6 HOURS AS NEEDED     Pulmonology:  Combination Products Passed - 09/08/2020  1:16 PM      Passed - Valid encounter within last 12 months    Recent Outpatient Visits          3 months ago Essential hypertension   [place], [place], [place]   4 months ago Primary hypertension   [facility] Carles Collet M, [place]   11 months ago Annual physical exam   [place], [place]   1 year ago Chronic diastolic heart failure [facility])   [place], [place], [place]   2 years ago Chronic obstructive pulmonary disease with acute lower respiratory infection [facility])   [facility] [place], Vickki Muff, [place]

## 2020-09-21 ENCOUNTER — Encounter: Payer: Self-pay | Admitting: Physician Assistant

## 2020-09-21 ENCOUNTER — Ambulatory Visit (INDEPENDENT_AMBULATORY_CARE_PROVIDER_SITE_OTHER): Payer: PPO | Admitting: Physician Assistant

## 2020-09-21 VITALS — BP 90/51 | HR 54 | Temp 97.8°F | Resp 16 | Ht 63.5 in | Wt 140.0 lb

## 2020-09-21 DIAGNOSIS — I5033 Acute on chronic diastolic (congestive) heart failure: Secondary | ICD-10-CM

## 2020-09-21 DIAGNOSIS — J441 Chronic obstructive pulmonary disease with (acute) exacerbation: Secondary | ICD-10-CM | POA: Diagnosis not present

## 2020-09-21 DIAGNOSIS — I110 Hypertensive heart disease with heart failure: Secondary | ICD-10-CM | POA: Diagnosis not present

## 2020-09-21 DIAGNOSIS — Z0000 Encounter for general adult medical examination without abnormal findings: Secondary | ICD-10-CM

## 2020-09-21 DIAGNOSIS — E7800 Pure hypercholesterolemia, unspecified: Secondary | ICD-10-CM

## 2020-09-21 DIAGNOSIS — R739 Hyperglycemia, unspecified: Secondary | ICD-10-CM | POA: Diagnosis not present

## 2020-09-21 DIAGNOSIS — I10 Essential (primary) hypertension: Secondary | ICD-10-CM

## 2020-09-21 NOTE — Progress Notes (Signed)
Annual Wellness Visit     [name], Female    DOB: 1931/03/11, 87 y.o.   MRN: [id number] Visit Date: 09/21/2020  Today's Provider: Mar Daring, PA-C   Chief Complaint  Patient presents with  . Annual Exam   Subjective    Amanda Strickland is a 87 y.o. female who presents today for her Annual Wellness Visit. She reports consuming a general diet. The patient does not participate in regular exercise at present. She generally feels well. She reports sleeping well. She does not have additional problems to discuss today.   HPI   Patient Active Problem List   Diagnosis Date Noted  . Acute respiratory failure with hypoxia ([place]) 09/20/2019  . Postmenopausal bleeding 08/21/2017  . CHF exacerbation ([place]) 07/24/2017  . COPD (chronic obstructive pulmonary disease) ([place]) 10/15/2015  . Chronic kidney disease (CKD), stage III (moderate) ([place]) 05/25/2015  . Congestive heart failure due to high blood pressure ([place]) 05/25/2015  . Fasciculation 05/25/2015  . Blood glucose elevated 05/25/2015  . OP (osteoporosis) 05/25/2015  . Leg varices 05/25/2015  . HTN (hypertension) 11/19/2014  . Dizziness 11/19/2014  . CAD (coronary artery disease) of bypass graft 01/29/2014  . History of colon polyps 02/18/2010  . Arteriosclerosis of coronary artery 03/30/2009  . Hypercholesteremia 03/30/2009   Past Surgical History:  Procedure Laterality Date  . CORONARY ARTERY BYPASS GRAFT  2001   Social History   Socioeconomic History  . Marital status: Widowed    Spouse name: Not on file  . Number of children: Not on file  . Years of education: Not on file  . Highest education level: Not on file  Occupational History  . Not on file  Tobacco Use  . Smoking status: Never Smoker  . Smokeless tobacco: Never Used  Vaping Use  . Vaping Use: Never used  Substance and Sexual Activity  . Alcohol use: No    Alcohol/week: 0.0 standard drinks  . Drug use: No  . Sexual activity:  Not on file  Other Topics Concern  . Not on file  Social History Narrative   Very independent at baseline.   Social Determinants of Health   Financial Resource Strain: Not on file  Food Insecurity: Not on file  Transportation Needs: Not on file  Physical Activity: Not on file  Stress: Not on file  Social Connections: Not on file  Intimate Partner Violence: Not on file   Family History  Problem Relation Age of Onset  . Heart attack Father   . Breast cancer Maternal Aunt    No Known Allergies     Medications: Outpatient Medications Prior to Visit  Medication Sig  . Albuterol Sulfate (PROAIR RESPICLICK) 123XX123 (90 Base) MCG/ACT AEPB Inhale 1-2 puffs into the lungs every 6 (six) hours as needed.  . Albuterol Sulfate 2.5 MG/0.5ML NEBU Inhale into the lungs as needed.  Marland Kitchen amLODipine (NORVASC) 5 MG tablet TAKE ONE TABLET BY MOUTH ONCE DAILY  . aspirin 81 MG tablet Take 81 mg by mouth. Twice a week  . benazepril-hydrochlorthiazide (LOTENSIN HCT) 20-12.5 MG tablet Take 1 tablet by mouth 2 (two) times daily.  . Budeson-Glycopyrrol-Formoterol (BREZTRI AEROSPHERE) 160-9-4.8 MCG/ACT AERO Inhale 2 puffs into the lungs in the morning and at bedtime.  Marland Kitchen CALCIUM CARBONATE-VIT D-MIN PO Take 1 tablet by mouth daily.   . furosemide (LASIX) 20 MG tablet Take 1 tablet (20 mg total) by mouth daily as needed for fluid or edema. (Patient taking differently: Take  40 mg by mouth 2 (two) times daily.)  . ipratropium-albuterol (DUONEB) 0.5-2.5 (3) MG/3ML SOLN USE 1 VIAL VIA NEBULIZER AND INHALE BY MOUTH EVERY 6 HOURS AS NEEDED  . L-Lysine 500 MG CAPS Take by mouth. Reported on 10/15/2015  . meloxicam (MOBIC) 15 MG tablet Take by mouth.  . Menthol-Methyl Salicylate (MUSCLE RUB EX) Apply 1 application topically daily as needed.   . metoprolol succinate (TOPROL-XL) 50 MG 24 hr tablet TAKE ONE TABLET BY MOUTH ONCE DAILY WITH OR IMMEDIATELY FOLLOWING A MEAL  . Multiple Vitamin (MULTIVITAMIN) capsule Take 1 capsule  by mouth daily.  . potassium chloride SA (KLOR-CON) 20 MEQ tablet TAKE ONE TABLET BY MOUTH ONCE DAILY AS NEEDED with furosemide  . sacubitril-valsartan (ENTRESTO) 24-26 MG Take 1 tablet by mouth every 12 (twelve) hours.  . simvastatin (ZOCOR) 20 MG tablet TAKE ONE TABLET BY MOUTH AT BEDTIME  . Spacer/Aero-Holding Chambers (AEROCHAMBER MINI CHAMBER) DEVI Use spacer with inhalers.  Marland Kitchen spironolactone (ALDACTONE) 25 MG tablet Take 1 tablet by mouth daily.  . [DISCONTINUED] amoxicillin-clavulanate (AUGMENTIN) 875-125 MG tablet Take 1 tablet by mouth 2 (two) times daily.  . [DISCONTINUED] Budeson-Glycopyrrol-Formoterol (BREZTRI AEROSPHERE) 160-9-4.8 MCG/ACT AERO Inhale 2 puffs into the lungs 2 (two) times daily.  . [DISCONTINUED] nystatin cream (MYCOSTATIN) Apply 1 application topically 2 (two) times daily. (Patient not taking: Reported on 09/21/2020)  . [DISCONTINUED] Omega-3 Fatty Acids (FISH OIL) 1000 MG CAPS Take 1 capsule by mouth daily.  . [DISCONTINUED] predniSONE (DELTASONE) 10 MG tablet Take 6 tablets PO on day 1 and day 2, take 5 tablets PO on day 3 and day 4, take 4 tablets PO on day 5 and day 6, take 3 tablets PO on day 7 and day 8, take 2 tablets PO on day 9 and day 10, take one tablet PO on day 11 and day 12.   No facility-administered medications prior to visit.    No Known Allergies  Patient Care Team: Rubye Beach as PCP - General (Family Medicine) Ubaldo Glassing Javier Docker, MD as Consulting Physician (Cardiology) Eulogio Bear, MD as Consulting Physician (Ophthalmology) Lillia Carmel Jeralene Huff, MD as Referring Physician (Dermatology)  Review of Systems  Constitutional: Negative.   HENT: Negative.   Eyes: Negative.   Respiratory: Negative.   Cardiovascular: Negative.   Gastrointestinal: Negative.   Endocrine: Negative.   Genitourinary: Negative.   Musculoskeletal: Negative.   Skin: Negative.   Allergic/Immunologic: Negative.   Neurological: Negative.    Hematological: Negative.   Psychiatric/Behavioral: Negative.     Last CBC Lab Results  Component Value Date   WBC 6.3 09/23/2019   HGB 15.5 09/23/2019   HCT 45.6 09/23/2019   MCV 95 09/23/2019   MCH 32.2 09/23/2019   RDW 12.3 09/23/2019   PLT 137 (L) [id number]   Last metabolic panel Lab Results  Component Value Date   GLUCOSE 124 (H) 09/23/2019   NA 145 (H) 09/23/2019   K 3.6 09/23/2019   CL 102 09/23/2019   CO2 26 09/23/2019   BUN 16 09/23/2019   CREATININE 0.85 09/23/2019   GFRNONAA 62 09/23/2019   GFRAA 72 09/23/2019   CALCIUM 10.1 09/23/2019   PROT 6.8 09/23/2019   ALBUMIN 4.4 09/23/2019   LABGLOB 2.4 09/23/2019   AGRATIO 1.8 09/23/2019   BILITOT 0.9 09/23/2019   ALKPHOS 95 09/23/2019   AST 35 09/23/2019   ALT 23 09/23/2019   ANIONGAP 12 07/25/2017   Last lipids Lab Results  Component Value Date   CHOL 170  09/23/2019   HDL 65 09/23/2019   LDLCALC 80 09/23/2019   TRIG 149 09/23/2019   CHOLHDL 2.3 01/23/2019   Last hemoglobin A1c Lab Results  Component Value Date   HGBA1C 5.6 09/23/2019   Last thyroid functions Lab Results  Component Value Date   TSH 2.020 09/23/2019   Last vitamin D No results found for: 25OHVITD2, 25OHVITD3, VD25OH Last vitamin B12 and Folate No results found for: VITAMINB12, FOLATE      Objective    Vitals: BP (!) 90/51 (BP Location: Right Arm, Patient Position: Sitting, Cuff Size: Normal)   Pulse (!) 54   Temp 97.8 F (36.6 C) (Oral)   Resp 16   Ht 5' 3.5" (1.613 m)   Wt 140 lb (63.5 kg)   SpO2 100%   BMI 24.41 kg/m  BP Readings from Last 3 Encounters:  09/21/20 (!) 90/51  06/30/20 116/72  05/13/20 (!) 120/58   Wt Readings from Last 3 Encounters:  09/21/20 140 lb (63.5 kg)  06/30/20 147 lb (66.7 kg)  05/13/20 156 lb 11.2 oz (71.1 kg)      Physical Exam Vitals reviewed.  Constitutional:      General: She is not in acute distress.    Appearance: Normal appearance. She is well-developed and normal  weight. She is not ill-appearing or diaphoretic.  HENT:     Head: Normocephalic and atraumatic.     Right Ear: Tympanic membrane, ear canal and external ear normal.     Left Ear: Tympanic membrane, ear canal and external ear normal.  Eyes:     General: No scleral icterus.       Right eye: No discharge.        Left eye: No discharge.     Extraocular Movements: Extraocular movements intact.     Conjunctiva/sclera: Conjunctivae normal.     Pupils: Pupils are equal, round, and reactive to light.  Neck:     Thyroid: No thyromegaly.     Vascular: No JVD.     Trachea: No tracheal deviation.  Cardiovascular:     Rate and Rhythm: Normal rate and regular rhythm.     Pulses: Normal pulses.     Heart sounds: Normal heart sounds. No murmur heard. No friction rub. No gallop.   Pulmonary:     Effort: Pulmonary effort is normal. No respiratory distress.     Breath sounds: Normal breath sounds. No wheezing or rales.  Chest:     Chest wall: No tenderness.  Abdominal:     General: Abdomen is flat. Bowel sounds are normal. There is no distension.     Palpations: Abdomen is soft. There is no mass.     Tenderness: There is no abdominal tenderness. There is no guarding or rebound.  Musculoskeletal:        General: No tenderness. Normal range of motion.     Cervical back: Normal range of motion and neck supple.     Right lower leg: No edema.     Left lower leg: No edema.  Lymphadenopathy:     Cervical: No cervical adenopathy.  Skin:    General: Skin is warm and dry.     Capillary Refill: Capillary refill takes less than 2 seconds.     Findings: No rash.  Neurological:     General: No focal deficit present.     Mental Status: She is alert and oriented to person, place, and time. Mental status is at baseline.     Motor: No weakness.  Gait: Gait normal.  Psychiatric:        Mood and Affect: Mood normal.        Behavior: Behavior normal.        Thought Content: Thought content normal.         Judgment: Judgment normal.     Most recent functional status assessment: In your present state of health, do you have any difficulty performing the following activities: 09/21/2020  Hearing? N  Vision? N  Difficulty concentrating or making decisions? N  Walking or climbing stairs? N  Dressing or bathing? N  Doing errands, shopping? N  Some recent data might be hidden   Most recent fall risk assessment: Fall Risk  09/21/2020  Falls in the past year? 0  Number falls in past yr: 0  Injury with Fall? 0  Risk for fall due to : No Fall Risks  Follow up Falls evaluation completed    Most recent depression screenings: PHQ 2/9 Scores 09/21/2020 05/06/2020  PHQ - 2 Score 0 0  PHQ- 9 Score 0 0   Most recent cognitive screening: 6CIT Screen 09/21/2020  What Year? 0 points  What month? 0 points  What time? 0 points  Count back from 20 0 points  Months in reverse 0 points  Repeat phrase 2 points  Total Score 2   Most recent Audit-C alcohol use screening Alcohol Use Disorder Test (AUDIT) 09/21/2020  1. How often do you have a drink containing alcohol? 0  2. How many drinks containing alcohol do you have on a typical day when you are drinking? 0  3. How often do you have six or more drinks on one occasion? 0  AUDIT-C Score 0  Alcohol Brief Interventions/Follow-up AUDIT Score <7 follow-up not indicated   A score of 3 or more in women, and 4 or more in men indicates increased risk for alcohol abuse, EXCEPT if all of the points are from question 1   No results found for any visits on 09/21/20.  Assessment & Plan     Annual wellness visit done today including the all of the following: Reviewed patient's Family Medical History Reviewed and updated list of patient's medical providers Assessment of cognitive impairment was done Assessed patient's functional ability Established a written schedule for health screening [place] Completed and Reviewed  Exercise  Activities and Dietary recommendations Goals    . Exercise      Recommend increasing exercise. Pt to start walking 5 days a week for 20-30 minutes at a time.        Immunization History  Administered Date(s) Administered  . Fluad Quad(high Dose 65+) 04/08/2019, 04/16/2020  . Influenza Split 03/30/2009, 04/06/2010  . Influenza, High Dose Seasonal PF 05/12/2014, 05/19/2016, 04/24/2017, 04/03/2018  . Influenza,inj,Quad PF,6+ Mos 05/12/2015  . PFIZER(Purple Top)SARS-COV-2 Vaccination 07/22/2019, 08/12/2019, 04/06/2020  . Pneumococcal Conjugate-13 05/12/2014  . Pneumococcal Polysaccharide-23 10/20/2016  . Td 09/13/2012  . Tdap 09/13/2012    Health Maintenance  Topic Date Due  . Samul Dada  09/14/2022  . INFLUENZA VACCINE  Completed  . DEXA SCAN  Completed  . COVID-19 Vaccine  Completed  . PNA vac Low Risk Adult  Completed  . HPV VACCINES  Aged Out     Discussed health benefits of physical activity, and encouraged her to engage in regular exercise appropriate for her age and condition.    1. Encounter for subsequent annual wellness visit (AWV) in Medicare patient Normal exam. Up to date on screenings and vaccinations.  2. Annual physical exam Normal physical exam today. Will check labs as below and f/u pending lab results. If labs are stable and WNL she will not need to have these rechecked for one year at her next annual physical exam. She is to call the office in the meantime if she has any acute issue, questions or concerns. - CBC with Differential/Platelet - Comprehensive metabolic panel - Hemoglobin A1c - Lipid Panel With LDL/HDL Ratio - TSH  3. Essential hypertension Stable. Continue current treatment plan. Will check labs as below and f/u pending results. - CBC with Differential/Platelet - Comprehensive metabolic panel  4. Hypercholesteremia Stable. Continue current treatment plan. Will check labs as below and f/u pending results. - Lipid Panel With LDL/HDL  Ratio  5. Blood glucose elevated Diet controlled. Will check labs as below and f/u pending results. - Hemoglobin A1c  6. COPD exacerbation ([place]) Stable at this time. Continue inhalers as prescribed.   7. Hypertensive heart disease with acute on chronic diastolic congestive heart failure ([place]) H/O this. Stable. Will check labs as below and f/u pending results. - Comprehensive metabolic panel - TSH   No follow-ups on file.     Reynolds Bowl, PA-C, have reviewed all documentation for this visit. The documentation on 09/27/20 for the exam, diagnosis, procedures, and orders are all accurate and complete.   Rubye Beach  [facility] [phone] (phone) [phone] (fax)  [place]

## 2020-09-21 NOTE — Patient Instructions (Signed)
Preventive Care 85 Years and Older, Female Preventive care refers to lifestyle choices and visits with your health care provider that can promote health and wellness. This includes:  A yearly physical exam. This is also called an annual wellness visit.  Regular dental and eye exams.  Immunizations.  Screening for certain conditions.  Healthy lifestyle choices, such as: ? Eating a healthy diet. ? Getting regular exercise. ? Not using drugs or products that contain nicotine and tobacco. ? Limiting alcohol use. What can I expect for my preventive care visit? Physical exam Your health care provider will check your:  Height and weight. These may be used to calculate your BMI (body mass index). BMI is a measurement that tells if you are at a healthy weight.  Heart rate and blood pressure.  Body temperature.  Skin for abnormal spots. Counseling Your health care provider may ask you questions about your:  Past medical problems.  Family's medical history.  Alcohol, tobacco, and drug use.  Emotional well-being.  Home life and relationship well-being.  Sexual activity.  Diet, exercise, and sleep habits.  History of falls.  Memory and ability to understand (cognition).  Work and work Statistician.  Pregnancy and menstrual history.  Access to firearms. What immunizations do I need? Vaccines are usually given at various ages, according to a schedule. Your health care provider will recommend vaccines for you based on your age, medical history, and lifestyle or other factors, such as travel or where you work.   What tests do I need? Blood tests  Lipid and cholesterol levels. These may be checked every 5 years, or more often depending on your overall health.  Hepatitis C test.  Hepatitis B test. Screening  Lung cancer screening. You may have this screening every year starting at age 85 if you have a 30-pack-year history of smoking and currently smoke or have quit within  the past 15 years.  Colorectal cancer screening. ? All adults should have this screening starting at age 85 and continuing until age 85. ? Your health care provider may recommend screening at age 85 if you are at increased risk. ? You will have tests every 1-10 years, depending on your results and the type of screening test.  Diabetes screening. ? This is done by checking your blood sugar (glucose) after you have not eaten for a while (fasting). ? You may have this done every 1-3 years.  Mammogram. ? This may be done every 1-2 years. ? Talk with your health care provider about how often you should have regular mammograms.  Abdominal aortic aneurysm (AAA) screening. You may need this if you are a current or former smoker.  BRCA-related cancer screening. This may be done if you have a family history of breast, ovarian, tubal, or peritoneal cancers. Other tests  STD (sexually transmitted disease) testing, if you are at risk.  Bone density scan. This is done to screen for osteoporosis. You may have this done starting at age 85. Talk with your health care provider about your test results, treatment options, and if necessary, the need for more tests. Follow these instructions at home: Eating and drinking  Eat a diet that includes fresh fruits and vegetables, whole grains, lean protein, and low-fat dairy products. Limit your intake of foods with high amounts of sugar, saturated fats, and salt.  Take vitamin and mineral supplements as recommended by your health care provider.  Do not drink alcohol if your health care provider tells you not to drink.  If you drink alcohol: ? Limit how much you have to 0-1 drink a day. ? Be aware of how much alcohol is in your drink. In the [place] one drink equals one 12 oz bottle of beer (355 mL), one 5 oz glass of wine (148 mL), or one 1 oz glass of hard liquor (44 mL).   Lifestyle  Take daily care of your teeth and gums. Brush your teeth every morning  and night with fluoride toothpaste. Floss one time each day.  Stay active. Exercise for at least 30 minutes 5 or more days each week.  Do not use any products that contain nicotine or tobacco, such as cigarettes, e-cigarettes, and chewing tobacco. If you need help quitting, ask your health care provider.  Do not use drugs.  If you are sexually active, practice safe sex. Use a condom or other form of protection in order to prevent STIs (sexually transmitted infections).  Talk with your health care provider about taking a low-dose aspirin or statin.  Find healthy ways to cope with stress, such as: ? Meditation, yoga, or listening to music. ? Journaling. ? Talking to a trusted person. ? Spending time with friends and family. Safety  Always wear your seat belt while driving or riding in a vehicle.  Do not drive: ? If you have been drinking alcohol. Do not ride with someone who has been drinking. ? When you are tired or distracted. ? While texting.  Wear a helmet and other protective equipment during sports activities.  If you have firearms in your house, make sure you follow all gun safety procedures. What's next?  Visit your health care provider once a year for an annual wellness visit.  Ask your health care provider how often you should have your eyes and teeth checked.  Stay up to date on all vaccines. This information is not intended to replace advice given to you by your health care provider. Make sure you discuss any questions you have with your health care provider. Document Revised: 06/17/2020 Document Reviewed: 06/21/2018 Elsevier Patient Education  2021 [employer].

## 2020-09-25 DIAGNOSIS — J9601 Acute respiratory failure with hypoxia: Secondary | ICD-10-CM | POA: Diagnosis not present

## 2020-09-25 DIAGNOSIS — J449 Chronic obstructive pulmonary disease, unspecified: Secondary | ICD-10-CM | POA: Diagnosis not present

## 2020-09-27 ENCOUNTER — Encounter: Payer: Self-pay | Admitting: Physician Assistant

## 2020-09-28 ENCOUNTER — Telehealth: Payer: Self-pay

## 2020-09-28 NOTE — Telephone Encounter (Signed)
Copied from [place] [phone] [phone]. Topic: General - Other >> Sep 28, 2020  4:16 PM Pawlus, Brayton Layman A wrote: Reason for CRM: Pt wanted a call back to know if she can get her Labs drawn at any [facility] location or if she needs to come into [facility]. Please advise.

## 2020-10-01 DIAGNOSIS — R739 Hyperglycemia, unspecified: Secondary | ICD-10-CM | POA: Diagnosis not present

## 2020-10-01 DIAGNOSIS — I110 Hypertensive heart disease with heart failure: Secondary | ICD-10-CM | POA: Diagnosis not present

## 2020-10-01 DIAGNOSIS — I5033 Acute on chronic diastolic (congestive) heart failure: Secondary | ICD-10-CM | POA: Diagnosis not present

## 2020-10-01 DIAGNOSIS — I10 Essential (primary) hypertension: Secondary | ICD-10-CM | POA: Diagnosis not present

## 2020-10-01 DIAGNOSIS — E7800 Pure hypercholesterolemia, unspecified: Secondary | ICD-10-CM | POA: Diagnosis not present

## 2020-10-01 DIAGNOSIS — Z0000 Encounter for general adult medical examination without abnormal findings: Secondary | ICD-10-CM | POA: Diagnosis not present

## 2020-10-02 ENCOUNTER — Encounter: Payer: Self-pay | Admitting: Physician Assistant

## 2020-10-02 ENCOUNTER — Other Ambulatory Visit: Payer: Self-pay | Admitting: Physician Assistant

## 2020-10-02 ENCOUNTER — Telehealth: Payer: Self-pay

## 2020-10-02 DIAGNOSIS — N185 Chronic kidney disease, stage 5: Secondary | ICD-10-CM

## 2020-10-02 DIAGNOSIS — N179 Acute kidney failure, unspecified: Secondary | ICD-10-CM

## 2020-10-02 LAB — LIPID PANEL WITH LDL/HDL RATIO
Cholesterol, Total: 165 mg/dL (ref 100–199)
HDL: 47 mg/dL (ref >39)
LDL Chol Calc (NIH): 82 mg/dL (ref 0–99)
LDL/HDL Ratio: 1.7 ratio (ref 0.0–3.2)
Triglycerides: 213 mg/dL — ABNORMAL HIGH (ref 0–149)
VLDL Cholesterol Cal: 36 mg/dL (ref 5–40)

## 2020-10-02 LAB — CBC WITH DIFFERENTIAL/PLATELET
Basophils Absolute: 0.1 10*3/uL (ref 0.0–0.2)
Basos: 1 %
EOS (ABSOLUTE): 0.4 10*3/uL (ref 0.0–0.4)
Eos: 5 %
Hematocrit: 37.3 % (ref 34.0–46.6)
Hemoglobin: 12.7 g/dL (ref 11.1–15.9)
Immature Grans (Abs): 0 10*3/uL (ref 0.0–0.1)
Immature Granulocytes: 0 %
Lymphocytes Absolute: 2.5 10*3/uL (ref 0.7–3.1)
Lymphs: 33 %
MCH: 31.8 pg (ref 26.6–33.0)
MCHC: 34 g/dL (ref 31.5–35.7)
MCV: 93 fL (ref 79–97)
Monocytes Absolute: 0.6 10*3/uL (ref 0.1–0.9)
Monocytes: 8 %
Neutrophils Absolute: 3.9 10*3/uL (ref 1.4–7.0)
Neutrophils: 53 %
Platelets: 136 10*3/uL — ABNORMAL LOW (ref 150–450)
RBC: 4 x10E6/uL (ref 3.77–5.28)
RDW: 12.1 % (ref 11.7–15.4)
WBC: 7.4 10*3/uL (ref 3.4–10.8)

## 2020-10-02 LAB — COMPREHENSIVE METABOLIC PANEL
ALT: 19 IU/L (ref 0–32)
AST: 27 IU/L (ref 0–40)
Albumin/Globulin Ratio: 1.8 (ref 1.2–2.2)
Albumin: 4.2 g/dL (ref 3.6–4.6)
Alkaline Phosphatase: 67 IU/L (ref 44–121)
BUN/Creatinine Ratio: 20 (ref 12–28)
BUN: 63 mg/dL — ABNORMAL HIGH (ref 8–27)
Bilirubin Total: 0.7 mg/dL (ref 0.0–1.2)
CO2: 23 mmol/L (ref 20–29)
Calcium: 10.8 mg/dL — ABNORMAL HIGH (ref 8.7–10.3)
Chloride: 102 mmol/L (ref 96–106)
Creatinine, Ser: 3.17 mg/dL — ABNORMAL HIGH (ref 0.57–1.00)
Globulin, Total: 2.4 g/dL (ref 1.5–4.5)
Glucose: 120 mg/dL — ABNORMAL HIGH (ref 65–99)
Potassium: 5.4 mmol/L — ABNORMAL HIGH (ref 3.5–5.2)
Sodium: 141 mmol/L (ref 134–144)
Total Protein: 6.6 g/dL (ref 6.0–8.5)
eGFR: 14 mL/min/{1.73_m2} — ABNORMAL LOW (ref >59)

## 2020-10-02 LAB — HEMOGLOBIN A1C
Est. average glucose Bld gHb Est-mCnc: 131 mg/dL
Hgb A1c MFr Bld: 6.2 % — ABNORMAL HIGH (ref 4.8–5.6)

## 2020-10-02 LAB — TSH: TSH: 2.59 u[IU]/mL (ref 0.450–4.500)

## 2020-10-02 NOTE — Telephone Encounter (Signed)
Referral placed.

## 2020-10-02 NOTE — Telephone Encounter (Signed)
-----   Message from Mar Daring, [place] sent at 10/02/2020 12:42 PM EDT ----- Charlett Nose,  There has been a drastic decline in your kidney function. I would recommend to stop meloxicam (if taking), do not take ibuprofen/advil/motrin or aleve/naproxen. Push fluids. Stop potassium supplement. I will refer you to a kidney specialist. Try not to take Furosemide either. They will call about this appointment.   Grace Bushy, [facility]

## 2020-10-02 NOTE — Telephone Encounter (Signed)
Patient's daughter Karena Addison) was advised and states to do what you got to do and she was going to send you a MyChart message as well. Just a [place]

## 2020-10-05 ENCOUNTER — Telehealth: Payer: Self-pay

## 2020-10-05 NOTE — Telephone Encounter (Signed)
Copied from [place] [phone]. Topic: General - Inquiry >> Oct 02, 2020  4:47 PM Greggory Keen D wrote: Reason for CRM: Pt's daughter called wanting Porsha to call regarding her mom's labs.  She really would like Porsha to call her back today/

## 2020-10-06 DIAGNOSIS — E7800 Pure hypercholesterolemia, unspecified: Secondary | ICD-10-CM | POA: Insufficient documentation

## 2020-10-06 DIAGNOSIS — N189 Chronic kidney disease, unspecified: Secondary | ICD-10-CM | POA: Insufficient documentation

## 2020-10-06 DIAGNOSIS — I10 Essential (primary) hypertension: Secondary | ICD-10-CM | POA: Insufficient documentation

## 2020-10-06 NOTE — Telephone Encounter (Signed)
See MyChart message.   Thanks,   -Mickel Baas

## 2020-10-06 NOTE — Telephone Encounter (Signed)
Karena Addison is calling and would like porsha to return her call  Also dee called [facility] and they do not have the referral

## 2020-10-06 NOTE — Telephone Encounter (Signed)
A mychart message was routed to [place] for review.

## 2020-10-07 NOTE — Telephone Encounter (Signed)
Returned [place] call and she states that Portal did receive the referral and appointment was scheduled.

## 2020-10-14 ENCOUNTER — Other Ambulatory Visit: Payer: Self-pay | Admitting: Nephrology

## 2020-10-14 ENCOUNTER — Other Ambulatory Visit (HOSPITAL_COMMUNITY): Payer: Self-pay | Admitting: Nephrology

## 2020-10-14 DIAGNOSIS — N1831 Chronic kidney disease, stage 3a: Secondary | ICD-10-CM

## 2020-10-14 DIAGNOSIS — I10 Essential (primary) hypertension: Secondary | ICD-10-CM | POA: Diagnosis not present

## 2020-10-14 DIAGNOSIS — R609 Edema, unspecified: Secondary | ICD-10-CM

## 2020-10-14 DIAGNOSIS — I509 Heart failure, unspecified: Secondary | ICD-10-CM | POA: Diagnosis not present

## 2020-10-14 DIAGNOSIS — R8290 Unspecified abnormal findings in urine: Secondary | ICD-10-CM

## 2020-10-14 DIAGNOSIS — N184 Chronic kidney disease, stage 4 (severe): Secondary | ICD-10-CM | POA: Diagnosis not present

## 2020-10-14 DIAGNOSIS — R809 Proteinuria, unspecified: Secondary | ICD-10-CM

## 2020-10-14 DIAGNOSIS — N179 Acute kidney failure, unspecified: Secondary | ICD-10-CM | POA: Diagnosis not present

## 2020-10-14 DIAGNOSIS — E875 Hyperkalemia: Secondary | ICD-10-CM | POA: Diagnosis not present

## 2020-10-16 DIAGNOSIS — I25708 Atherosclerosis of coronary artery bypass graft(s), unspecified, with other forms of angina pectoris: Secondary | ICD-10-CM | POA: Diagnosis not present

## 2020-10-16 DIAGNOSIS — R410 Disorientation, unspecified: Secondary | ICD-10-CM | POA: Diagnosis not present

## 2020-10-16 DIAGNOSIS — I5020 Unspecified systolic (congestive) heart failure: Secondary | ICD-10-CM | POA: Diagnosis not present

## 2020-10-16 DIAGNOSIS — I159 Secondary hypertension, unspecified: Secondary | ICD-10-CM | POA: Diagnosis not present

## 2020-10-16 DIAGNOSIS — R0602 Shortness of breath: Secondary | ICD-10-CM | POA: Diagnosis not present

## 2020-10-19 ENCOUNTER — Other Ambulatory Visit (HOSPITAL_COMMUNITY): Payer: Self-pay | Admitting: Cardiology

## 2020-10-19 ENCOUNTER — Other Ambulatory Visit: Payer: Self-pay | Admitting: Cardiology

## 2020-10-19 DIAGNOSIS — R410 Disorientation, unspecified: Secondary | ICD-10-CM

## 2020-10-19 DIAGNOSIS — I5020 Unspecified systolic (congestive) heart failure: Secondary | ICD-10-CM

## 2020-10-20 ENCOUNTER — Other Ambulatory Visit: Payer: Self-pay | Admitting: Nephrology

## 2020-10-20 DIAGNOSIS — N1831 Chronic kidney disease, stage 3a: Secondary | ICD-10-CM

## 2020-10-20 DIAGNOSIS — E875 Hyperkalemia: Secondary | ICD-10-CM

## 2020-10-20 DIAGNOSIS — N179 Acute kidney failure, unspecified: Secondary | ICD-10-CM

## 2020-10-20 DIAGNOSIS — I10 Essential (primary) hypertension: Secondary | ICD-10-CM

## 2020-10-20 DIAGNOSIS — R8290 Unspecified abnormal findings in urine: Secondary | ICD-10-CM

## 2020-10-20 DIAGNOSIS — R809 Proteinuria, unspecified: Secondary | ICD-10-CM

## 2020-10-20 DIAGNOSIS — I509 Heart failure, unspecified: Secondary | ICD-10-CM

## 2020-10-20 DIAGNOSIS — R609 Edema, unspecified: Secondary | ICD-10-CM

## 2020-10-26 DIAGNOSIS — J9601 Acute respiratory failure with hypoxia: Secondary | ICD-10-CM | POA: Diagnosis not present

## 2020-10-26 DIAGNOSIS — J449 Chronic obstructive pulmonary disease, unspecified: Secondary | ICD-10-CM | POA: Diagnosis not present

## 2020-10-29 DIAGNOSIS — Z7185 Encounter for immunization safety counseling: Secondary | ICD-10-CM | POA: Diagnosis not present

## 2020-10-29 DIAGNOSIS — Z23 Encounter for immunization: Secondary | ICD-10-CM | POA: Diagnosis not present

## 2020-11-02 ENCOUNTER — Ambulatory Visit
Admission: RE | Admit: 2020-11-02 | Discharge: 2020-11-02 | Disposition: A | Discharge status: Home / Self Care | Payer: PPO | Source: Ambulatory Visit | Attending: Cardiology | Admitting: Cardiology

## 2020-11-02 ENCOUNTER — Other Ambulatory Visit: Payer: Self-pay

## 2020-11-02 DIAGNOSIS — I5020 Unspecified systolic (congestive) heart failure: Secondary | ICD-10-CM | POA: Diagnosis not present

## 2020-11-02 DIAGNOSIS — G319 Degenerative disease of nervous system, unspecified: Secondary | ICD-10-CM | POA: Diagnosis not present

## 2020-11-02 DIAGNOSIS — R443 Hallucinations, unspecified: Secondary | ICD-10-CM | POA: Diagnosis not present

## 2020-11-02 DIAGNOSIS — R410 Disorientation, unspecified: Secondary | ICD-10-CM | POA: Diagnosis not present

## 2020-11-10 ENCOUNTER — Ambulatory Visit
Admission: RE | Admit: 2020-11-10 | Discharge: 2020-11-10 | Disposition: A | Discharge status: Home / Self Care | Payer: PPO | Source: Ambulatory Visit | Attending: Nephrology | Admitting: Nephrology

## 2020-11-10 ENCOUNTER — Ambulatory Visit
Admission: RE | Admit: 2020-11-10 | Discharge: 2020-11-10 | Disposition: A | Discharge status: Home / Self Care | Payer: PPO | Source: Ambulatory Visit | Attending: Cardiology | Admitting: Cardiology

## 2020-11-10 ENCOUNTER — Other Ambulatory Visit: Payer: Self-pay

## 2020-11-10 ENCOUNTER — Other Ambulatory Visit: Payer: Self-pay | Admitting: Cardiology

## 2020-11-10 DIAGNOSIS — N183 Chronic kidney disease, stage 3 unspecified: Secondary | ICD-10-CM | POA: Diagnosis not present

## 2020-11-10 DIAGNOSIS — N1831 Chronic kidney disease, stage 3a: Secondary | ICD-10-CM | POA: Diagnosis not present

## 2020-11-10 DIAGNOSIS — I509 Heart failure, unspecified: Secondary | ICD-10-CM | POA: Insufficient documentation

## 2020-11-10 DIAGNOSIS — N184 Chronic kidney disease, stage 4 (severe): Secondary | ICD-10-CM

## 2020-11-10 DIAGNOSIS — R8290 Unspecified abnormal findings in urine: Secondary | ICD-10-CM | POA: Insufficient documentation

## 2020-11-10 DIAGNOSIS — R609 Edema, unspecified: Secondary | ICD-10-CM | POA: Insufficient documentation

## 2020-11-10 DIAGNOSIS — I10 Essential (primary) hypertension: Secondary | ICD-10-CM | POA: Diagnosis not present

## 2020-11-10 DIAGNOSIS — N200 Calculus of kidney: Secondary | ICD-10-CM | POA: Diagnosis not present

## 2020-11-10 DIAGNOSIS — R809 Proteinuria, unspecified: Secondary | ICD-10-CM

## 2020-11-10 DIAGNOSIS — I5021 Acute systolic (congestive) heart failure: Secondary | ICD-10-CM | POA: Diagnosis not present

## 2020-11-10 DIAGNOSIS — I5022 Chronic systolic (congestive) heart failure: Secondary | ICD-10-CM | POA: Insufficient documentation

## 2020-11-10 MED ORDER — FUROSEMIDE 10 MG/ML IJ SOLN: 80.0000 mg | Freq: Once | INTRAMUSCULAR | Status: AC

## 2020-11-10 MED ORDER — POTASSIUM CHLORIDE CRYS ER 20 MEQ PO TBCR
EXTENDED_RELEASE_TABLET | ORAL | Status: AC
  Administered 2020-11-10: 40 meq via ORAL
  Filled 2020-11-10: qty 2

## 2020-11-10 MED ORDER — FUROSEMIDE 10 MG/ML IJ SOLN
INTRAMUSCULAR | Status: AC
  Administered 2020-11-10: 80 mg via INTRAVENOUS
  Filled 2020-11-10: qty 8

## 2020-11-10 MED ORDER — POTASSIUM CHLORIDE CRYS ER 20 MEQ PO TBCR: 40.0000 meq | EXTENDED_RELEASE_TABLET | Freq: Two times a day (BID) | ORAL | Status: DC

## 2020-11-10 MED ORDER — POTASSIUM CHLORIDE ER 10 MEQ PO TBCR
40.0000 meq | EXTENDED_RELEASE_TABLET | Freq: Every day | ORAL | Status: AC
Start: 2020-11-10 — End: ?

## 2020-11-17 ENCOUNTER — Ambulatory Visit (INDEPENDENT_AMBULATORY_CARE_PROVIDER_SITE_OTHER): Payer: PPO | Admitting: Vascular Surgery

## 2020-11-17 ENCOUNTER — Other Ambulatory Visit: Payer: Self-pay

## 2020-11-17 ENCOUNTER — Ambulatory Visit: Payer: PPO

## 2020-11-17 ENCOUNTER — Encounter (INDEPENDENT_AMBULATORY_CARE_PROVIDER_SITE_OTHER): Payer: Self-pay | Admitting: Vascular Surgery

## 2020-11-17 VITALS — BP 128/75 | HR 93 | Resp 18 | Ht 65.0 in | Wt 162.0 lb

## 2020-11-17 DIAGNOSIS — E7800 Pure hypercholesterolemia, unspecified: Secondary | ICD-10-CM | POA: Diagnosis not present

## 2020-11-17 DIAGNOSIS — M7989 Other specified soft tissue disorders: Secondary | ICD-10-CM | POA: Diagnosis not present

## 2020-11-17 DIAGNOSIS — N183 Chronic kidney disease, stage 3 unspecified: Secondary | ICD-10-CM

## 2020-11-17 DIAGNOSIS — I110 Hypertensive heart disease with heart failure: Secondary | ICD-10-CM

## 2020-11-17 DIAGNOSIS — I10 Essential (primary) hypertension: Secondary | ICD-10-CM

## 2020-11-17 DIAGNOSIS — I5032 Chronic diastolic (congestive) heart failure: Secondary | ICD-10-CM

## 2020-11-17 DIAGNOSIS — I5033 Acute on chronic diastolic (congestive) heart failure: Secondary | ICD-10-CM | POA: Diagnosis not present

## 2020-11-17 NOTE — Assessment & Plan Note (Signed)
lipid control important in reducing the progression of atherosclerotic disease. Continue statin therapy  

## 2020-11-17 NOTE — Assessment & Plan Note (Signed)
Worsens LE swelling 

## 2020-11-17 NOTE — Assessment & Plan Note (Signed)
The patient has severe swelling of both lower extremities associated with weeping and skin breakdown worse on the right than left.  She can no longer wear compression stockings.  This is likely multifactorial from severe congestive heart failure and now kidney disease with significant renal insufficiency.  She is already on a high dose of diuretics but apparently overdiuresis led to renal failure in the recent months.  We are going to place the patient in 3 layer Unna boots today.  These will be changed weekly.  The patient will have a venous reflux study performed in the upcoming weeks at her convenience when her swelling is better after few weeks in the Unna boots.

## 2020-11-17 NOTE — Assessment & Plan Note (Signed)
A contributing factor to her leg swelling.

## 2020-11-17 NOTE — Progress Notes (Signed)
Patient ID: Amanda Strickland, female   DOB: 05/11/1931, 87 y.o.   MRN: [id number]  Chief Complaint  Patient presents with  . New Patient (Initial Visit)    Bilateral leg swelling    HPI Amanda Strickland is a 87 y.o. female.  I am asked to see the patient by Dr. Ubaldo Glassing for evaluation of significant leg swelling with weeping and skin changes.  The patient has had chronic heart failure for many years and has had some chronic leg swelling that she has managed reasonably well.  Over the past couple of months, she had her diuretics increased to try to improve her heart failure but this led to some renal insufficiency issues.  She is now back on a reasonably high dose of diuretics and they are trying to manage between heart failure and renal insufficiency.  Her leg swelling has worsened with all of the issues and exacerbations.  She has developed weeping from the right leg and some blistering on the left leg.  The skin has become reddened and thickened.  The legs are very heavy and her ambulation was already limited but it is extremely limited now.  It is difficult for her to elevate her legs as much as she would like for her leg swelling due to her poor respiratory status with her heart failure issues.  She can no longer wear her compression stockings due to the massive swelling.     Past Medical History:  Diagnosis Date  . Arthritis   . CAD (coronary artery disease)   . CHF (congestive heart failure) (HCC)    diastolic heart failure  . Depression   . Heart attack ([place]) 1994  . Hypertension     Past Surgical History:  Procedure Laterality Date  . CORONARY ARTERY BYPASS GRAFT  2001     Family History  Problem Relation Age of Onset  . Heart attack Father   . Breast cancer Maternal Aunt   no bleeding or clotting disorders   Social History   Tobacco Use  . Smoking status: Never Smoker  . Smokeless tobacco: Never Used  Vaping Use  . Vaping Use: Never used  Substance Use Topics  .  Alcohol use: No    Alcohol/week: 0.0 standard drinks  . Drug use: No     No Known Allergies  Current Outpatient Medications  Medication Sig Dispense Refill  . Albuterol Sulfate (PROAIR RESPICLICK) 803 (90 Base) MCG/ACT AEPB Inhale 1-2 puffs into the lungs every 6 (six) hours as needed. 1 each 5  . Albuterol Sulfate 2.5 MG/0.5ML NEBU Inhale into the lungs as needed.    Marland Kitchen amLODipine (NORVASC) 5 MG tablet Take 1 tablet by mouth daily.    Marland Kitchen aspirin 81 MG tablet Take 81 mg by mouth. Twice a week    . Budeson-Glycopyrrol-Formoterol (BREZTRI AEROSPHERE) 160-9-4.8 MCG/ACT AERO Inhale 2 puffs into the lungs in the morning and at bedtime. 10.7 g 11  . Budeson-Glycopyrrol-Formoterol (BREZTRI AEROSPHERE) 160-9-4.8 MCG/ACT AERO Inhale 2 puffs into the lungs 2 (two) times daily.    Marland Kitchen CALCIUM CARBONATE-VIT D-MIN PO Take 1 tablet by mouth daily.     . furosemide (LASIX) 20 MG tablet Take 80 mg by mouth 2 (two) times daily.    Marland Kitchen ipratropium-albuterol (DUONEB) 0.5-2.5 (3) MG/3ML SOLN USE 1 VIAL VIA NEBULIZER AND INHALE BY MOUTH EVERY 6 HOURS AS NEEDED 360 mL 0  . L-Lysine 500 MG CAPS Take by mouth. Reported on 10/15/2015    . Menthol-Methyl  Salicylate (MUSCLE RUB EX) Apply 1 application topically daily as needed.     . MENTHOL-METHYL SALICYLATE EX Apply 1 application topically daily as needed.    . metoprolol succinate (TOPROL-XL) 50 MG 24 hr tablet TAKE ONE TABLET BY MOUTH ONCE DAILY WITH OR IMMEDIATELY FOLLOWING A MEAL 90 tablet 0  . Multiple Vitamin (MULTIVITAMIN) capsule Take 1 capsule by mouth daily.    . potassium chloride SA (KLOR-CON) 20 MEQ tablet TAKE ONE TABLET BY MOUTH ONCE DAILY AS NEEDED with furosemide 90 tablet 3  . simvastatin (ZOCOR) 20 MG tablet TAKE ONE TABLET BY MOUTH AT BEDTIME 90 tablet 0  . simvastatin (ZOCOR) 20 MG tablet Take 1 tablet by mouth at bedtime.    Marland Kitchen Spacer/Aero-Holding Chambers (AEROCHAMBER MINI CHAMBER) DEVI Use spacer with inhalers. 1 Device 0  . amLODipine (NORVASC) 5  MG tablet TAKE ONE TABLET BY MOUTH ONCE DAILY (Patient not taking: Reported on 11/17/2020) 90 tablet 0  . ASPIRIN 81 PO Take 81 mg by mouth twice a week    . benazepril-hydrochlorthiazide (LOTENSIN HCT) 20-12.5 MG tablet Take 1 tablet by mouth 2 (two) times daily. (Patient not taking: Reported on 11/17/2020) 180 tablet 3  . potassium citrate (UROCIT-K) 10 MEQ (1080 MG) SR tablet Take by mouth.    . sacubitril-valsartan (ENTRESTO) 24-26 MG Take 1 tablet by mouth every 12 (twelve) hours. (Patient not taking: Reported on 11/17/2020)    . spironolactone (ALDACTONE) 25 MG tablet Take 1 tablet by mouth daily. (Patient not taking: Reported on 11/17/2020)     No current facility-administered medications for this visit.   Facility-Administered Medications Ordered in Other Visits  Medication Dose Route Frequency Provider Last Rate Last Admin  . furosemide (LASIX) injection 80 mg  80 mg Intravenous Once Teodoro Spray, MD      . potassium chloride (KLOR-CON) CR tablet 40 mEq  40 mEq Oral Daily Teodoro Spray, MD          REVIEW OF SYSTEMS (Negative unless checked)  Constitutional: '[]' Weight loss  '[]' Fever  '[]' Chills Cardiac: '[]' Chest pain   '[]' Chest pressure   '[]' Palpitations   '[]' Shortness of breath when laying flat   '[]' Shortness of breath at rest   '[]' Shortness of breath with exertion. Vascular:  '[]' Pain in legs with walking   '[]' Pain in legs at rest   '[]' Pain in legs when laying flat   '[]' Claudication   '[]' Pain in feet when walking  '[]' Pain in feet at rest  '[]' Pain in feet when laying flat   '[]' History of DVT   '[]' Phlebitis   '[x]' Swelling in legs   '[]' Varicose veins   '[]' Non-healing ulcers Pulmonary:   '[]' Uses home oxygen   '[]' Productive cough   '[]' Hemoptysis   '[]' Wheeze  '[]' COPD   '[]' Asthma Neurologic:  '[]' Dizziness  '[]' Blackouts   '[]' Seizures   '[]' History of stroke   '[]' History of TIA  '[]' Aphasia   '[]' Temporary blindness   '[]' Dysphagia   '[]' Weakness or numbness in arms   '[]' Weakness or numbness in legs Musculoskeletal:  '[x]' Arthritis    '[]' Joint swelling   '[x]' Joint pain   '[]' Low back pain Hematologic:  '[]' Easy bruising  '[]' Easy bleeding   '[]' Hypercoagulable state   '[]' Anemic  '[]' Hepatitis Gastrointestinal:  '[]' Blood in stool   '[]' Vomiting blood  '[]' Gastroesophageal reflux/heartburn   '[]' Abdominal pain Genitourinary:  '[]' Chronic kidney disease   '[]' Difficult urination  '[]' Frequent urination  '[]' Burning with urination   '[]' Hematuria Skin:  '[]' Rashes   '[]' Ulcers   '[]' Wounds Psychological:  '[]' History of anxiety   '[x]'  History of major  depression.    Physical Exam BP 128/75 (BP Location: Left Arm)   Pulse 93   Resp 18   Ht '5\' 5"'  (1.651 m)   Wt 162 lb (73.5 kg)   BMI 26.96 kg/m  Gen:  WD/WN, NAD Head: [place]/AT, No temporalis wasting.  Ear/Nose/Throat: Hearing diminished, nares w/o erythema or drainage, oropharynx w/o Erythema/Exudate Eyes: Conjunctiva clear, sclera non-icteric  Neck: trachea midline.  No JVD.  Pulmonary:  Good air movement, respirations not labored, no use of accessory muscles  Cardiac: irregular Vascular:  Vessel Right Left  Radial Palpable Palpable                          DP NP NP  PT NP NP   Gastrointestinal:. No masses, surgical incisions, or scars. Musculoskeletal: M/S 5/5 throughout.  Extremities without ischemic changes.  Marked stasis dermatitis changes are present bilaterally.  3+ bilateral lower extremity with some weeping and blistering from the right leg and a smaller amount of blistering on the left leg. Neurologic: Sensation grossly intact in extremities.  Symmetrical.  Speech is fluent. Motor exam as listed above. Psychiatric: Judgment intact, Mood & affect appropriate for pt's clinical situation. Dermatologic: No rashes or ulcers noted.  No cellulitis or open wounds.    Radiology MR BRAIN WO CONTRAST  Result Date: 11/02/2020 CLINICAL DATA:  Provided history: Hallucinations for 1 month; head injury from fall earlier last week. EXAM: MRI HEAD WITHOUT CONTRAST TECHNIQUE: Multiplanar, multiecho pulse  sequences of the brain and surrounding structures were obtained without intravenous contrast. COMPARISON:  No pertinent prior exams available for comparison. FINDINGS: Brain: Mild intermittent motion degradation. Mild cerebral and cerebellar atrophy. Commensurate prominence of the ventricles and sulci. Chronic lacunar infarcts within the right centrum semiovale, right corona radiata, bilateral basal ganglia, right thalamus and right cerebellar hemisphere. Background moderate multifocal T2/FLAIR hyperintensity within the cerebral white matter, nonspecific but compatible with chronic small vessel ischemic disease. There is no acute infarct. No evidence of intracranial mass. No chronic intracranial blood products. No extra-axial fluid collection. No midline shift. Vascular: Expected proximal arterial flow voids. Skull and upper cervical spine: No focal marrow lesion. Incompletely assessed upper cervical spondylosis. Mild ligamentous hypertrophy/pannus formation posterior to the dens Sinuses/Orbits: Visualized orbits show no acute finding. No significant paranasal sinus disease. IMPRESSION: No evidence of acute intracranial abnormality. Chronic lacunar infarcts within the right centrum semiovale, right corona radiata, bilateral basal ganglia, right thalamus and right cerebellar hemisphere. Background moderate cerebral white matter chronic small vessel ischemic disease. Mild generalized parenchymal atrophy. Electronically Signed   By: Kellie Simmering DO   On: 11/02/2020 11:41   US RENAL  Result Date: 11/12/2020 CLINICAL DATA:  Kidney injury. Stage 3 chronic kidney disease. Proteinuria. EXAM: RENAL / URINARY TRACT ULTRASOUND COMPLETE COMPARISON:  None. FINDINGS: Right Kidney: Renal measurements: 10.5 x 5.2 x 5.5 cm = volume: 157.2 mL. Normal parenchymal echogenicity. Mild diffuse cortical thinning. No mass, stone or hydronephrosis. Left Kidney: Renal measurements: 10.6 x 4.4 x 5.3 cm = volume: 130.2 mL. Normal parenchymal  echogenicity. Mild diffuse cortical thinning. 5 mm nonobstructing stone, lower pole. No masses. No hydronephrosis. Bladder: Appears normal for degree of bladder distention. Other: Left greater than right pleural effusions. IMPRESSION: 1. No acute findings.  No hydronephrosis. 2. 5 mm nonobstructing stone, lower pole of the left kidney. Mild bilateral renal cortical thinning. Electronically Signed   By: Lajean Manes M.D.   On: 11/12/2020 14:05    Labs Recent  Results (from the past 2160 hour(s))  CBC with Differential/Platelet     Status: Abnormal   Collection Time: 10/01/20  8:38 AM  Result Value Ref Range   WBC 7.4 3.4 - 10.8 x10E3/uL   RBC 4.00 3.77 - 5.28 x10E6/uL   Hemoglobin 12.7 11.1 - 15.9 g/dL   Hematocrit 37.3 34.0 - 46.6 %   MCV 93 79 - 97 fL   MCH 31.8 26.6 - 33.0 pg   MCHC 34.0 31.5 - 35.7 g/dL   RDW 12.1 11.7 - 15.4 %   Platelets 136 (L) 150 - 450 x10E3/uL   Neutrophils 53 Not Estab. %   Lymphs 33 Not Estab. %   Monocytes 8 Not Estab. %   Eos 5 Not Estab. %   Basos 1 Not Estab. %   Neutrophils Absolute 3.9 1.4 - 7.0 x10E3/uL   Lymphocytes Absolute 2.5 0.7 - 3.1 x10E3/uL   Monocytes Absolute 0.6 0.1 - 0.9 x10E3/uL   EOS (ABSOLUTE) 0.4 0.0 - 0.4 x10E3/uL   Basophils Absolute 0.1 0.0 - 0.2 x10E3/uL   Immature Granulocytes 0 Not Estab. %   Immature Grans (Abs) 0.0 0.0 - 0.1 x10E3/uL  Comprehensive metabolic panel     Status: Abnormal   Collection Time: 10/01/20  8:38 AM  Result Value Ref Range   Glucose 120 (H) 65 - 99 mg/dL   BUN 63 (H) 8 - 27 mg/dL   Creatinine, Ser 3.17 (H) 0.57 - 1.00 mg/dL   eGFR 14 (L) >59 mL/min/1.73   BUN/Creatinine Ratio 20 12 - 28   Sodium 141 134 - 144 mmol/L   Potassium 5.4 (H) 3.5 - 5.2 mmol/L   Chloride 102 96 - 106 mmol/L   CO2 23 20 - 29 mmol/L   Calcium 10.8 (H) 8.7 - 10.3 mg/dL   Total Protein 6.6 6.0 - 8.5 g/dL   Albumin 4.2 3.6 - 4.6 g/dL   Globulin, Total 2.4 1.5 - 4.5 g/dL   Albumin/Globulin Ratio 1.8 1.2 - 2.2   Bilirubin  Total 0.7 0.0 - 1.2 mg/dL   Alkaline Phosphatase 67 44 - 121 IU/L   AST 27 0 - 40 IU/L   ALT 19 0 - 32 IU/L  Hemoglobin A1c     Status: Abnormal   Collection Time: 10/01/20  8:38 AM  Result Value Ref Range   Hgb A1c MFr Bld 6.2 (H) 4.8 - 5.6 %    Comment:          Prediabetes: 5.7 - 6.4          Diabetes: >6.4          Glycemic control for adults with diabetes: <7.0    Est. average glucose Bld gHb Est-mCnc 131 mg/dL  Lipid Panel With LDL/HDL Ratio     Status: Abnormal   Collection Time: 10/01/20  8:38 AM  Result Value Ref Range   Cholesterol, Total 165 100 - 199 mg/dL   Triglycerides 213 (H) 0 - 149 mg/dL   HDL 47 >39 mg/dL   VLDL Cholesterol Cal 36 5 - 40 mg/dL   LDL Chol Calc (NIH) 82 0 - 99 mg/dL   LDL/HDL Ratio 1.7 0.0 - 3.2 ratio    Comment:                                     LDL/HDL Ratio  Men  Women                               1/2 Avg.Risk  1.0    1.5                                   Avg.Risk  3.6    3.2                                2X Avg.Risk  6.2    5.0                                3X Avg.Risk  8.0    6.1   TSH     Status: None   Collection Time: 10/01/20  8:38 AM  Result Value Ref Range   TSH 2.590 0.450 - 4.500 uIU/mL    Assessment/Plan:  Swelling of limb The patient has severe swelling of both lower extremities associated with weeping and skin breakdown worse on the right than left.  She can no longer wear compression stockings.  This is likely multifactorial from severe congestive heart failure and now kidney disease with significant renal insufficiency.  She is already on a high dose of diuretics but apparently overdiuresis led to renal failure in the recent months.  We are going to place the patient in 3 layer Unna boots today.  These will be changed weekly.  The patient will have a venous reflux study performed in the upcoming weeks at her convenience when her swelling is better after few weeks in the Unna  boots.  HTN (hypertension) blood pressure control important in reducing the progression of atherosclerotic disease. On appropriate oral medications.   Chronic kidney disease (CKD), stage III (moderate) A contributing factor to her leg swelling.  Congestive heart failure due to high blood pressure (HCC) Worsens LE swelling.      Leotis Pain 11/17/2020, 3:33 PM   This note was created with Dragon medical transcription system.  Any errors from dictation are unintentional.

## 2020-11-17 NOTE — Assessment & Plan Note (Signed)
blood pressure control important in reducing the progression of atherosclerotic disease. On appropriate oral medications.  

## 2020-11-18 ENCOUNTER — Encounter (INDEPENDENT_AMBULATORY_CARE_PROVIDER_SITE_OTHER): Payer: PPO

## 2020-11-18 DIAGNOSIS — N184 Chronic kidney disease, stage 4 (severe): Secondary | ICD-10-CM | POA: Diagnosis not present

## 2020-11-18 DIAGNOSIS — I10 Essential (primary) hypertension: Secondary | ICD-10-CM | POA: Diagnosis not present

## 2020-11-23 ENCOUNTER — Other Ambulatory Visit: Payer: Self-pay | Admitting: Physician Assistant

## 2020-11-23 DIAGNOSIS — I5032 Chronic diastolic (congestive) heart failure: Secondary | ICD-10-CM

## 2020-11-23 NOTE — Telephone Encounter (Signed)
Requested medication (s) are due for refill today: yes  Requested medication (s) are on the active medication list: yes   Future visit scheduled: no  Notes to clinic:  Patient has not scheduled with new provider yet  Failed protocol:  K in normal range and within 360 days   Cr in normal range and within 360 days     Requested Prescriptions  Pending Prescriptions Disp Refills   potassium chloride SA (KLOR-CON) 20 MEQ tablet [Pharmacy Med Name: potassium chloride ER 20 mEq tablet,extended release(part/cryst)] 90 tablet 0    Sig: TAKE ONE TABLET BY MOUTH ONCE DAILY AS NEEDED WITH FUROSEMIDE      Endocrinology:  Minerals - Potassium Supplementation Failed - 11/23/2020 11:25 AM      Failed - K in normal range and within 360 days    Potassium  Date Value Ref Range Status  10/01/2020 5.4 (H) 3.5 - 5.2 mmol/L Final  01/15/2014 2.9 (L) 3.5 - 5.1 mmol/L Final          Failed - Cr in normal range and within 360 days    Creat  Date Value Ref Range Status  04/24/2017 0.91 (H) 0.60 - 0.88 mg/dL Final    Comment:    For patients >81 years of age, the reference limit for Creatinine is approximately 13% higher for people identified as African-American. .    Creatinine, Ser  Date Value Ref Range Status  10/01/2020 3.17 (H) 0.57 - 1.00 mg/dL Final          Passed - Valid encounter within last 12 months    Recent Outpatient Visits           6 months ago Essential hypertension   [facility] [place], [place]   6 months ago Primary hypertension   [facility] [place], [place]   1 year ago Annual physical exam   [place], [place]   1 year ago Chronic diastolic heart failure [facility])   [place], [place], [place]   2 years ago Chronic obstructive pulmonary disease with acute lower respiratory infection [facility])   [facility] [place], [place], [place]

## 2020-11-24 DIAGNOSIS — R0602 Shortness of breath: Secondary | ICD-10-CM | POA: Diagnosis not present

## 2020-11-24 DIAGNOSIS — I159 Secondary hypertension, unspecified: Secondary | ICD-10-CM | POA: Diagnosis not present

## 2020-11-24 DIAGNOSIS — I110 Hypertensive heart disease with heart failure: Secondary | ICD-10-CM | POA: Diagnosis not present

## 2020-11-24 DIAGNOSIS — E7800 Pure hypercholesterolemia, unspecified: Secondary | ICD-10-CM | POA: Diagnosis not present

## 2020-11-24 DIAGNOSIS — I5022 Chronic systolic (congestive) heart failure: Secondary | ICD-10-CM | POA: Diagnosis not present

## 2020-11-24 DIAGNOSIS — I25708 Atherosclerosis of coronary artery bypass graft(s), unspecified, with other forms of angina pectoris: Secondary | ICD-10-CM | POA: Diagnosis not present

## 2020-11-24 DIAGNOSIS — I5020 Unspecified systolic (congestive) heart failure: Secondary | ICD-10-CM | POA: Diagnosis not present

## 2020-11-24 DIAGNOSIS — N1832 Chronic kidney disease, stage 3b: Secondary | ICD-10-CM | POA: Diagnosis not present

## 2020-11-25 ENCOUNTER — Encounter (INDEPENDENT_AMBULATORY_CARE_PROVIDER_SITE_OTHER): Payer: Self-pay

## 2020-11-25 ENCOUNTER — Other Ambulatory Visit: Payer: Self-pay

## 2020-11-25 ENCOUNTER — Ambulatory Visit (INDEPENDENT_AMBULATORY_CARE_PROVIDER_SITE_OTHER): Payer: PPO | Admitting: Nurse Practitioner

## 2020-11-25 VITALS — BP 127/68 | HR 96 | Ht 65.0 in | Wt 160.0 lb

## 2020-11-25 DIAGNOSIS — R600 Localized edema: Secondary | ICD-10-CM | POA: Diagnosis not present

## 2020-11-25 DIAGNOSIS — M7989 Other specified soft tissue disorders: Secondary | ICD-10-CM

## 2020-11-25 DIAGNOSIS — J9601 Acute respiratory failure with hypoxia: Secondary | ICD-10-CM | POA: Diagnosis not present

## 2020-11-25 DIAGNOSIS — J449 Chronic obstructive pulmonary disease, unspecified: Secondary | ICD-10-CM | POA: Diagnosis not present

## 2020-11-25 NOTE — Progress Notes (Signed)
History of Present Illness  There is no documented history at this time  Assessments & Plan   There are no diagnoses linked to this encounter.    Additional instructions  Subjective:  Patient presents with venous ulcer of the Bilateral lower extremity.    Procedure:  3 layer unna wrap was placed Bilateral lower extremity.   Plan:   Follow up in one week.  

## 2020-12-01 DIAGNOSIS — I5020 Unspecified systolic (congestive) heart failure: Secondary | ICD-10-CM | POA: Diagnosis not present

## 2020-12-02 ENCOUNTER — Other Ambulatory Visit: Payer: Self-pay

## 2020-12-02 ENCOUNTER — Ambulatory Visit (INDEPENDENT_AMBULATORY_CARE_PROVIDER_SITE_OTHER): Payer: PPO | Admitting: Nurse Practitioner

## 2020-12-02 VITALS — BP 124/79 | HR 69 | Resp 16 | Ht 65.0 in | Wt 156.0 lb

## 2020-12-02 DIAGNOSIS — M7989 Other specified soft tissue disorders: Secondary | ICD-10-CM

## 2020-12-02 NOTE — Progress Notes (Signed)
History of Present Illness  There is no documented history at this time  Assessments & Plan   There are no diagnoses linked to this encounter.    Additional instructions  Subjective:  Patient presents with venous ulcer of the Bilateral lower extremity.    Procedure:  3 layer unna wrap was placed Bilateral lower extremity.   Plan:   Follow up in one week.  

## 2020-12-07 ENCOUNTER — Encounter (INDEPENDENT_AMBULATORY_CARE_PROVIDER_SITE_OTHER): Payer: Self-pay | Admitting: Nurse Practitioner

## 2020-12-09 ENCOUNTER — Other Ambulatory Visit: Payer: Self-pay

## 2020-12-09 ENCOUNTER — Ambulatory Visit (INDEPENDENT_AMBULATORY_CARE_PROVIDER_SITE_OTHER): Payer: PPO | Admitting: Nurse Practitioner

## 2020-12-09 ENCOUNTER — Encounter (INDEPENDENT_AMBULATORY_CARE_PROVIDER_SITE_OTHER): Payer: Self-pay

## 2020-12-09 VITALS — BP 132/62 | HR 77 | Resp 16 | Wt 147.2 lb

## 2020-12-09 DIAGNOSIS — R600 Localized edema: Secondary | ICD-10-CM

## 2020-12-09 DIAGNOSIS — M7989 Other specified soft tissue disorders: Secondary | ICD-10-CM

## 2020-12-09 NOTE — Progress Notes (Signed)
History of Present Illness  There is no documented history at this time  Assessments & Plan   There are no diagnoses linked to this encounter.    Additional instructions  Subjective:  Patient presents with venous ulcer of the Bilateral lower extremity.    Procedure:  3 layer unna wrap was placed Bilateral lower extremity.   Plan:   Follow up in one week.  

## 2020-12-13 ENCOUNTER — Encounter (INDEPENDENT_AMBULATORY_CARE_PROVIDER_SITE_OTHER): Payer: Self-pay | Admitting: Nurse Practitioner

## 2020-12-16 ENCOUNTER — Other Ambulatory Visit: Payer: Self-pay

## 2020-12-16 ENCOUNTER — Ambulatory Visit (INDEPENDENT_AMBULATORY_CARE_PROVIDER_SITE_OTHER): Payer: PPO | Admitting: Nurse Practitioner

## 2020-12-16 ENCOUNTER — Ambulatory Visit (INDEPENDENT_AMBULATORY_CARE_PROVIDER_SITE_OTHER): Payer: PPO

## 2020-12-16 ENCOUNTER — Encounter (INDEPENDENT_AMBULATORY_CARE_PROVIDER_SITE_OTHER): Payer: Self-pay | Admitting: Nurse Practitioner

## 2020-12-16 VITALS — BP 128/69 | HR 75 | Resp 16 | Wt 144.0 lb

## 2020-12-16 DIAGNOSIS — R600 Localized edema: Secondary | ICD-10-CM | POA: Diagnosis not present

## 2020-12-16 DIAGNOSIS — I5033 Acute on chronic diastolic (congestive) heart failure: Secondary | ICD-10-CM

## 2020-12-16 DIAGNOSIS — N183 Chronic kidney disease, stage 3 unspecified: Secondary | ICD-10-CM | POA: Diagnosis not present

## 2020-12-16 DIAGNOSIS — I10 Essential (primary) hypertension: Secondary | ICD-10-CM

## 2020-12-16 DIAGNOSIS — M7989 Other specified soft tissue disorders: Secondary | ICD-10-CM

## 2020-12-16 DIAGNOSIS — E7800 Pure hypercholesterolemia, unspecified: Secondary | ICD-10-CM | POA: Diagnosis not present

## 2020-12-26 DIAGNOSIS — J9601 Acute respiratory failure with hypoxia: Secondary | ICD-10-CM | POA: Diagnosis not present

## 2020-12-26 DIAGNOSIS — J449 Chronic obstructive pulmonary disease, unspecified: Secondary | ICD-10-CM | POA: Diagnosis not present

## 2020-12-28 ENCOUNTER — Encounter (INDEPENDENT_AMBULATORY_CARE_PROVIDER_SITE_OTHER): Payer: Self-pay | Admitting: Nurse Practitioner

## 2020-12-28 NOTE — Progress Notes (Signed)
Subjective:    Patient ID: Amanda Strickland, female    DOB: 1931/06/10, 87 y.o.   MRN: [id number] Chief Complaint  Patient presents with   Follow-up    Ultrasound /unna boot follow up    Amanda Strickland is a 87 y.o. female.  She returns today for evaluation of her lower extremities after being in [place] wraps for about 4 weeks.  Today the lower extremities are much improved.  The weeping has stopped and the blistering has resolved.  The patient has a history of heart failure however due to use of diuretics she has developed some renal insufficiency issues.  Her cardiologist and nephrologist are trying to balance between her heart failure and kidney disease.  Patient has elevated this time as well.  She has any fevers or chills.  She denies any cellulitis.  Today the patient underwent noninvasive studies.  It shows evidence of reflux in the great saphenous vein beginning at the proximal thigh extending to the knee.  There is also reflux in the small saphenous vein.  There is no deep venous insufficiency.  There is no DVT or superficial venous reflux.   Review of Systems  Cardiovascular:  Positive for leg swelling.  Skin:  Positive for wound.  Neurological:  Positive for weakness.  All other systems reviewed and are negative.     Objective:   Physical Exam Vitals reviewed.  HENT:     Head: Normocephalic.  Cardiovascular:     Rate and Rhythm: Normal rate.     Pulses: Normal pulses.  Pulmonary:     Effort: Pulmonary effort is normal.  Neurological:     Mental Status: She is alert and oriented to person, place, and time.     Motor: Weakness present.  Psychiatric:        Mood and Affect: Mood normal.        Behavior: Behavior normal.        Thought Content: Thought content normal.        Judgment: Judgment normal.    BP 128/69 (BP Location: Right Arm)   Pulse 75   Resp 16   Wt 144 lb (65.3 kg)   BMI 23.96 kg/m   Past Medical History:  Diagnosis Date   Arthritis    CAD  (coronary artery disease)    CHF (congestive heart failure) (HCC)    diastolic heart failure   Depression    Heart attack ([place]) 1994   Hypertension     Social History   Socioeconomic History   Marital status: Widowed    Spouse name: Not on file   Number of children: Not on file   Years of education: Not on file   Highest education level: Not on file  Occupational History   Not on file  Tobacco Use   Smoking status: Never   Smokeless tobacco: Never  Vaping Use   Vaping Use: Never used  Substance and Sexual Activity   Alcohol use: No    Alcohol/week: 0.0 standard drinks   Drug use: No   Sexual activity: Not on file  Other Topics Concern   Not on file  Social History Narrative   Very independent at baseline.   Social Determinants of Health   Financial Resource Strain: Not on file  Food Insecurity: Not on file  Transportation Needs: Not on file  Physical Activity: Not on file  Stress: Not on file  Social Connections: Not on file  Intimate Partner Violence: Not on file  Past Surgical History:  Procedure Laterality Date   CORONARY ARTERY BYPASS GRAFT  2001    Family History  Problem Relation Age of Onset   Heart attack Father    Breast cancer Maternal Aunt     No Known Allergies  CBC Latest Ref Rng & Units 10/01/2020 09/23/2019 01/23/2019  WBC 3.4 - 10.8 x10E3/uL 7.4 6.3 9.0  Hemoglobin 11.1 - 15.9 g/dL 12.7 15.5 15.3  Hematocrit 34.0 - 46.6 % 37.3 45.6 44.7  Platelets 150 - 450 x10E3/uL 136(L) 137(L) 144(L)      CMP     Component Value Date/Time   NA 141 10/01/2020 0838   NA 140 01/15/2014 0505   K 5.4 (H) 10/01/2020 0838   K 2.9 (L) 01/15/2014 0505   CL 102 10/01/2020 0838   CL 102 01/15/2014 0505   CO2 23 10/01/2020 0838   CO2 29 01/15/2014 0505   GLUCOSE 120 (H) 10/01/2020 0838   GLUCOSE 118 (H) 07/25/2017 0417   GLUCOSE 122 (H) 01/15/2014 0505   BUN 63 (H) 10/01/2020 0838   BUN 12 01/15/2014 0505   CREATININE 3.17 (H) 10/01/2020 0838    CREATININE 0.91 (H) 04/24/2017 1424   CALCIUM 10.8 (H) 10/01/2020 0838   CALCIUM 8.9 01/15/2014 0505   PROT 6.6 10/01/2020 0838   ALBUMIN 4.2 10/01/2020 0838   AST 27 10/01/2020 0838   ALT 19 10/01/2020 0838   ALKPHOS 67 10/01/2020 0838   BILITOT 0.7 10/01/2020 0838   GFRNONAA 62 09/23/2019 0924   GFRNONAA 58 (L) 04/24/2017 1424   GFRAA 72 09/23/2019 0924   GFRAA 68 04/24/2017 1424     No results found.     Assessment & Plan:   1. Swelling of limb The patient swelling is much improved from previously due to the wraps.  The patient is advised to try to utilize compression with elevation.  Once the patient's fluid status and diuretics are under better control, consideration for a lymphedema pump can be made.  We will have the patient return in 6 months to evaluate progress with conservative therapy. 2. Acute on chronic diastolic congestive heart failure (HCC) A large contributing factor to the patient's lower extremity edema  3. Stage 3 chronic kidney disease, unspecified whether stage 3a or 3b CKD (HCC) A contributing factor to worsening lower extremity edema  4. Primary hypertension Continue antihypertensive medications as already ordered, these medications have been reviewed and there are no changes at this time.   5. Hypercholesterolemia Continue statin as ordered and reviewed, no changes at this time    Current Outpatient Medications on File Prior to Visit  Medication Sig Dispense Refill   Albuterol Sulfate (PROAIR RESPICLICK) [id number] (90 Base) MCG/ACT AEPB Inhale 1-2 puffs into the lungs every 6 (six) hours as needed. 1 each 5   Albuterol Sulfate 2.5 MG/0.5ML NEBU Inhale into the lungs as needed.     amLODipine (NORVASC) 5 MG tablet Take 1 tablet by mouth daily.     aspirin 81 MG tablet Take 81 mg by mouth. Twice a week     ASPIRIN 81 PO Take 81 mg by mouth twice a week     Budeson-Glycopyrrol-Formoterol (BREZTRI AEROSPHERE) 160-9-4.8 MCG/ACT AERO Inhale 2 puffs into the  lungs in the morning and at bedtime. 10.7 g 11   Budeson-Glycopyrrol-Formoterol (BREZTRI AEROSPHERE) 160-9-4.8 MCG/ACT AERO Inhale 2 puffs into the lungs 2 (two) times daily.     CALCIUM CARBONATE-VIT D-MIN PO Take 1 tablet by mouth daily.  furosemide (LASIX) 20 MG tablet Take 80 mg by mouth 2 (two) times daily.     ipratropium-albuterol (DUONEB) 0.5-2.5 (3) MG/3ML SOLN USE 1 VIAL VIA NEBULIZER AND INHALE BY MOUTH EVERY 6 HOURS AS NEEDED 360 mL 0   L-Lysine 500 MG CAPS Take by mouth. Reported on 10/15/2015     Menthol-Methyl Salicylate (MUSCLE RUB EX) Apply 1 application topically daily as needed.      MENTHOL-METHYL SALICYLATE EX Apply 1 application topically daily as needed.     metoprolol succinate (TOPROL-XL) 50 MG 24 hr tablet TAKE ONE TABLET BY MOUTH ONCE DAILY WITH OR IMMEDIATELY FOLLOWING A MEAL 90 tablet 0   Multiple Vitamin (MULTIVITAMIN) capsule Take 1 capsule by mouth daily.     potassium chloride SA (KLOR-CON) 20 MEQ tablet TAKE ONE TABLET BY MOUTH ONCE DAILY AS NEEDED WITH FUROSEMIDE 90 tablet 0   potassium citrate (UROCIT-K) 10 MEQ (1080 MG) SR tablet Take by mouth.     sacubitril-valsartan (ENTRESTO) 24-26 MG Take 1 tablet by mouth every 12 (twelve) hours.     simvastatin (ZOCOR) 20 MG tablet TAKE ONE TABLET BY MOUTH AT BEDTIME 90 tablet 0   simvastatin (ZOCOR) 20 MG tablet Take 1 tablet by mouth at bedtime.     Spacer/Aero-Holding Chambers (AEROCHAMBER MINI CHAMBER) DEVI Use spacer with inhalers. 1 Device 0   spironolactone (ALDACTONE) 25 MG tablet Take 1 tablet by mouth daily.     amLODipine (NORVASC) 5 MG tablet TAKE ONE TABLET BY MOUTH ONCE DAILY (Patient not taking: No sig reported) 90 tablet 0   benazepril-hydrochlorthiazide (LOTENSIN HCT) 20-12.5 MG tablet Take 1 tablet by mouth 2 (two) times daily. (Patient not taking: No sig reported) 180 tablet 3   Current Facility-Administered Medications on File Prior to Visit  Medication Dose Route Frequency Provider Last Rate Last  Admin   furosemide (LASIX) injection 80 mg  80 mg Intravenous Once Teodoro Spray, MD       potassium chloride (KLOR-CON) CR tablet 40 mEq  40 mEq Oral Daily Teodoro Spray, MD        There are no Patient Instructions on file for this visit. No follow-ups on file.   Kris Hartmann, NP

## 2020-12-29 DIAGNOSIS — J449 Chronic obstructive pulmonary disease, unspecified: Secondary | ICD-10-CM | POA: Diagnosis not present

## 2020-12-29 DIAGNOSIS — I110 Hypertensive heart disease with heart failure: Secondary | ICD-10-CM | POA: Diagnosis not present

## 2020-12-29 DIAGNOSIS — R0602 Shortness of breath: Secondary | ICD-10-CM | POA: Diagnosis not present

## 2020-12-29 DIAGNOSIS — N1832 Chronic kidney disease, stage 3b: Secondary | ICD-10-CM | POA: Diagnosis not present

## 2020-12-29 DIAGNOSIS — I25708 Atherosclerosis of coronary artery bypass graft(s), unspecified, with other forms of angina pectoris: Secondary | ICD-10-CM | POA: Diagnosis not present

## 2020-12-29 DIAGNOSIS — I5022 Chronic systolic (congestive) heart failure: Secondary | ICD-10-CM | POA: Diagnosis not present

## 2020-12-29 DIAGNOSIS — E7800 Pure hypercholesterolemia, unspecified: Secondary | ICD-10-CM | POA: Diagnosis not present

## 2020-12-29 DIAGNOSIS — I5020 Unspecified systolic (congestive) heart failure: Secondary | ICD-10-CM | POA: Diagnosis not present

## 2021-01-12 DIAGNOSIS — I5022 Chronic systolic (congestive) heart failure: Secondary | ICD-10-CM | POA: Diagnosis not present

## 2021-01-25 DIAGNOSIS — J9601 Acute respiratory failure with hypoxia: Secondary | ICD-10-CM | POA: Diagnosis not present

## 2021-01-25 DIAGNOSIS — J449 Chronic obstructive pulmonary disease, unspecified: Secondary | ICD-10-CM | POA: Diagnosis not present

## 2021-02-16 DIAGNOSIS — I25708 Atherosclerosis of coronary artery bypass graft(s), unspecified, with other forms of angina pectoris: Secondary | ICD-10-CM | POA: Diagnosis not present

## 2021-02-16 DIAGNOSIS — I10 Essential (primary) hypertension: Secondary | ICD-10-CM | POA: Diagnosis not present

## 2021-02-16 DIAGNOSIS — I110 Hypertensive heart disease with heart failure: Secondary | ICD-10-CM | POA: Diagnosis not present

## 2021-02-16 DIAGNOSIS — M25511 Pain in right shoulder: Secondary | ICD-10-CM | POA: Diagnosis not present

## 2021-02-16 DIAGNOSIS — G8929 Other chronic pain: Secondary | ICD-10-CM | POA: Diagnosis not present

## 2021-02-16 DIAGNOSIS — N1832 Chronic kidney disease, stage 3b: Secondary | ICD-10-CM | POA: Diagnosis not present

## 2021-02-16 DIAGNOSIS — I5042 Chronic combined systolic (congestive) and diastolic (congestive) heart failure: Secondary | ICD-10-CM | POA: Diagnosis not present

## 2021-02-25 DIAGNOSIS — J9601 Acute respiratory failure with hypoxia: Secondary | ICD-10-CM | POA: Diagnosis not present

## 2021-02-25 DIAGNOSIS — J449 Chronic obstructive pulmonary disease, unspecified: Secondary | ICD-10-CM | POA: Diagnosis not present

## 2021-03-28 DIAGNOSIS — J9601 Acute respiratory failure with hypoxia: Secondary | ICD-10-CM | POA: Diagnosis not present

## 2021-03-28 DIAGNOSIS — J449 Chronic obstructive pulmonary disease, unspecified: Secondary | ICD-10-CM | POA: Diagnosis not present

## 2021-04-27 DIAGNOSIS — J449 Chronic obstructive pulmonary disease, unspecified: Secondary | ICD-10-CM | POA: Diagnosis not present

## 2021-04-27 DIAGNOSIS — J9601 Acute respiratory failure with hypoxia: Secondary | ICD-10-CM | POA: Diagnosis not present

## 2021-05-17 DIAGNOSIS — Z79899 Other long term (current) drug therapy: Secondary | ICD-10-CM | POA: Diagnosis not present

## 2021-05-17 DIAGNOSIS — Z780 Asymptomatic menopausal state: Secondary | ICD-10-CM | POA: Diagnosis not present

## 2021-05-17 DIAGNOSIS — Z0000 Encounter for general adult medical examination without abnormal findings: Secondary | ICD-10-CM | POA: Diagnosis not present

## 2021-05-17 DIAGNOSIS — I25708 Atherosclerosis of coronary artery bypass graft(s), unspecified, with other forms of angina pectoris: Secondary | ICD-10-CM | POA: Diagnosis not present

## 2021-05-17 DIAGNOSIS — N1832 Chronic kidney disease, stage 3b: Secondary | ICD-10-CM | POA: Diagnosis not present

## 2021-05-17 DIAGNOSIS — I10 Essential (primary) hypertension: Secondary | ICD-10-CM | POA: Diagnosis not present

## 2021-05-17 DIAGNOSIS — R739 Hyperglycemia, unspecified: Secondary | ICD-10-CM | POA: Diagnosis not present

## 2021-05-28 DIAGNOSIS — J9601 Acute respiratory failure with hypoxia: Secondary | ICD-10-CM | POA: Diagnosis not present

## 2021-05-28 DIAGNOSIS — J449 Chronic obstructive pulmonary disease, unspecified: Secondary | ICD-10-CM | POA: Diagnosis not present

## 2021-06-17 ENCOUNTER — Ambulatory Visit (INDEPENDENT_AMBULATORY_CARE_PROVIDER_SITE_OTHER): Payer: PPO | Admitting: Nurse Practitioner

## 2021-06-27 DIAGNOSIS — J449 Chronic obstructive pulmonary disease, unspecified: Secondary | ICD-10-CM | POA: Diagnosis not present

## 2021-06-27 DIAGNOSIS — J9601 Acute respiratory failure with hypoxia: Secondary | ICD-10-CM | POA: Diagnosis not present

## 2021-08-23 DIAGNOSIS — H524 Presbyopia: Secondary | ICD-10-CM | POA: Diagnosis not present

## 2021-08-23 DIAGNOSIS — H43811 Vitreous degeneration, right eye: Secondary | ICD-10-CM | POA: Diagnosis not present

## 2021-08-23 DIAGNOSIS — H3562 Retinal hemorrhage, left eye: Secondary | ICD-10-CM | POA: Diagnosis not present

## 2021-09-06 DIAGNOSIS — R739 Hyperglycemia, unspecified: Secondary | ICD-10-CM | POA: Diagnosis not present

## 2021-09-06 DIAGNOSIS — Z780 Asymptomatic menopausal state: Secondary | ICD-10-CM | POA: Diagnosis not present

## 2021-09-06 DIAGNOSIS — N1832 Chronic kidney disease, stage 3b: Secondary | ICD-10-CM | POA: Diagnosis not present

## 2021-09-06 DIAGNOSIS — I10 Essential (primary) hypertension: Secondary | ICD-10-CM | POA: Diagnosis not present

## 2021-09-13 DIAGNOSIS — G8929 Other chronic pain: Secondary | ICD-10-CM | POA: Diagnosis not present

## 2021-09-13 DIAGNOSIS — I110 Hypertensive heart disease with heart failure: Secondary | ICD-10-CM | POA: Diagnosis not present

## 2021-09-13 DIAGNOSIS — I10 Essential (primary) hypertension: Secondary | ICD-10-CM | POA: Diagnosis not present

## 2021-09-13 DIAGNOSIS — M25511 Pain in right shoulder: Secondary | ICD-10-CM | POA: Diagnosis not present

## 2021-09-13 DIAGNOSIS — N1832 Chronic kidney disease, stage 3b: Secondary | ICD-10-CM | POA: Diagnosis not present

## 2021-09-13 DIAGNOSIS — I5042 Chronic combined systolic (congestive) and diastolic (congestive) heart failure: Secondary | ICD-10-CM | POA: Diagnosis not present

## 2021-09-13 DIAGNOSIS — I25708 Atherosclerosis of coronary artery bypass graft(s), unspecified, with other forms of angina pectoris: Secondary | ICD-10-CM | POA: Diagnosis not present

## 2021-09-24 IMAGING — CR DG CHEST 2V
1 series · 2 of 2 positions shown · non-contrast
Comparison: Most recent comparison 05/14/2018

CLINICAL DATA: Shortness of breath for 2 weeks. Dry cough for 2
days. History of COPD. Decreased breath sounds.

EXAM:
CHEST - 2 VIEW

[Series 1: dg chest 2 view · 0.14mm/px · 2 of 2 slices shown]
[im 1/2]
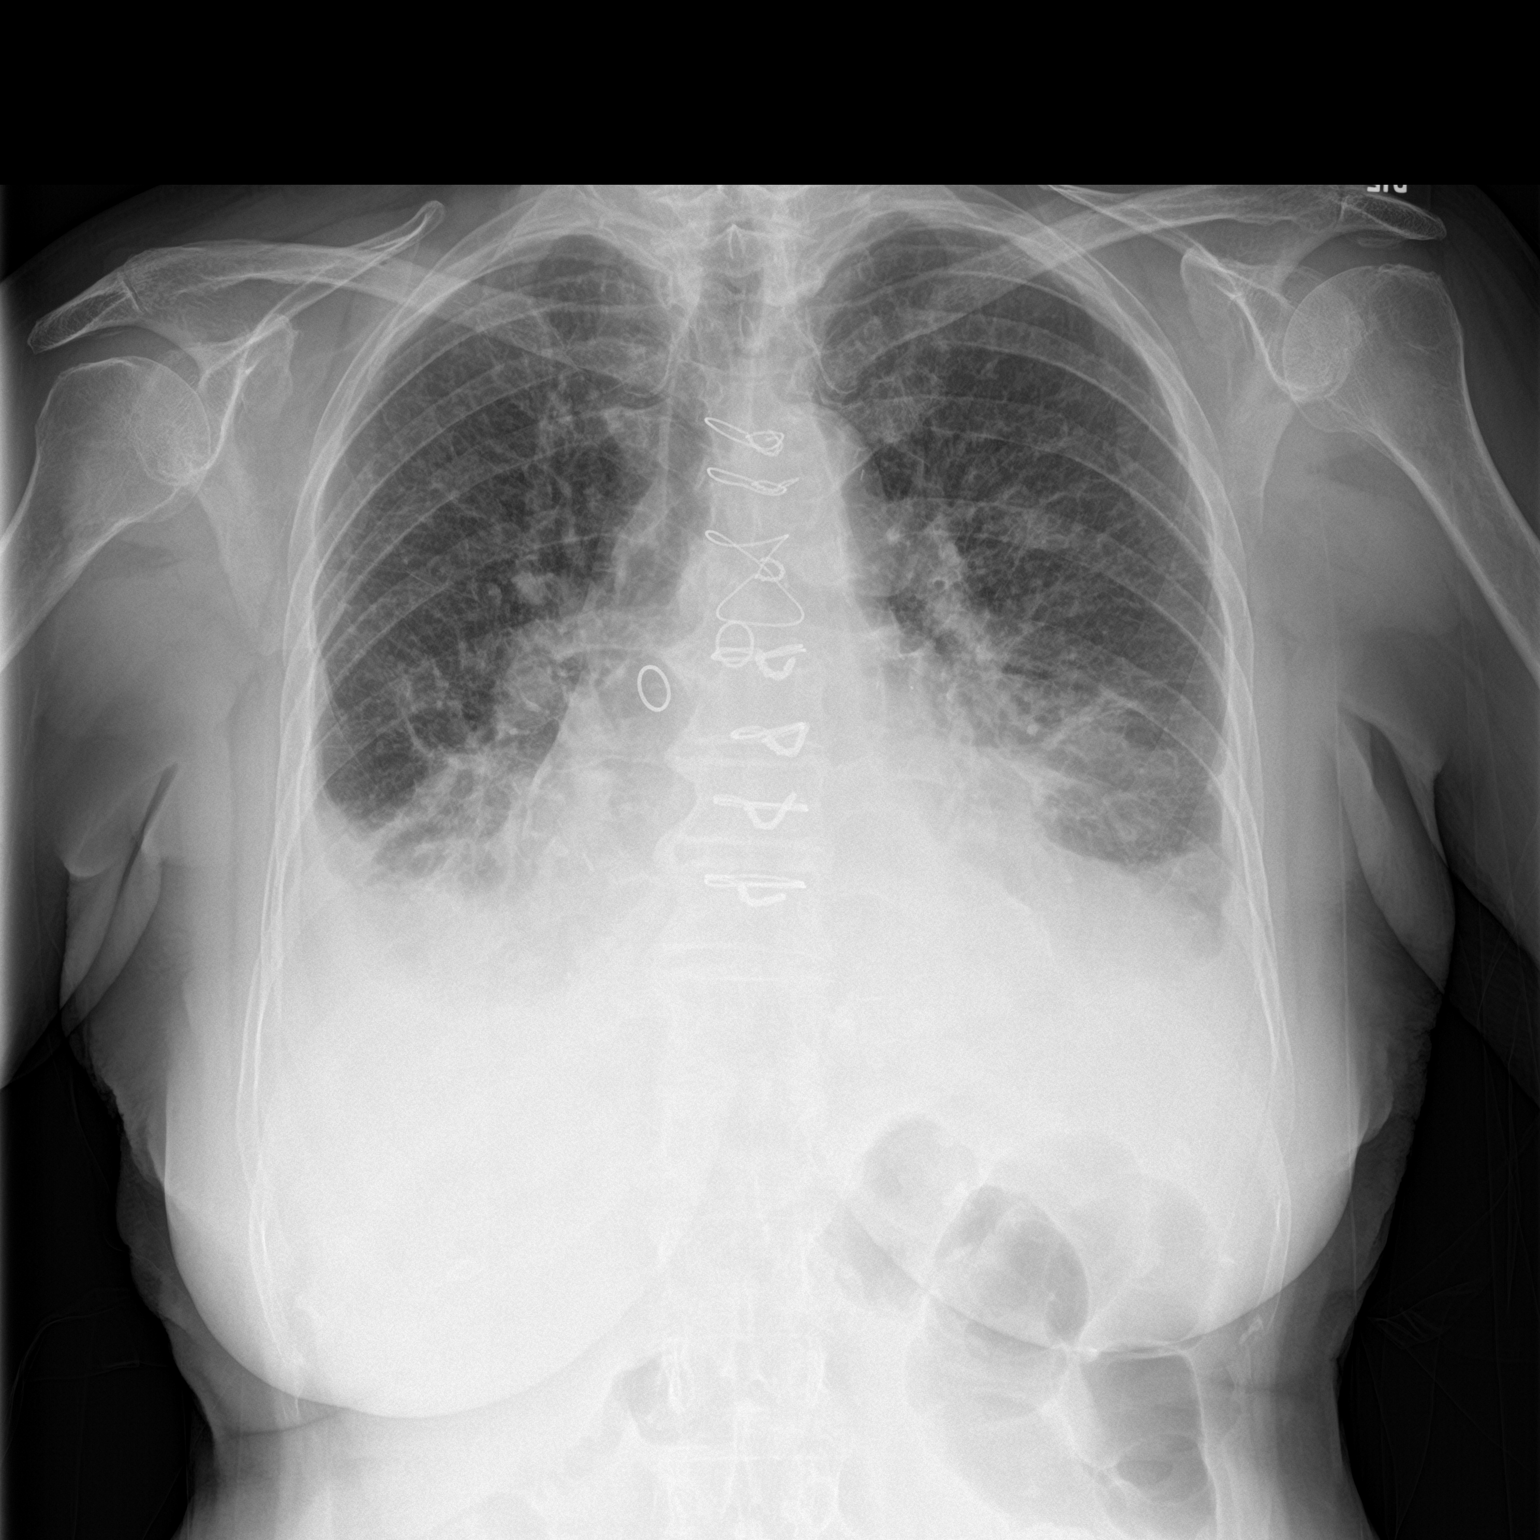
[im 2/2]
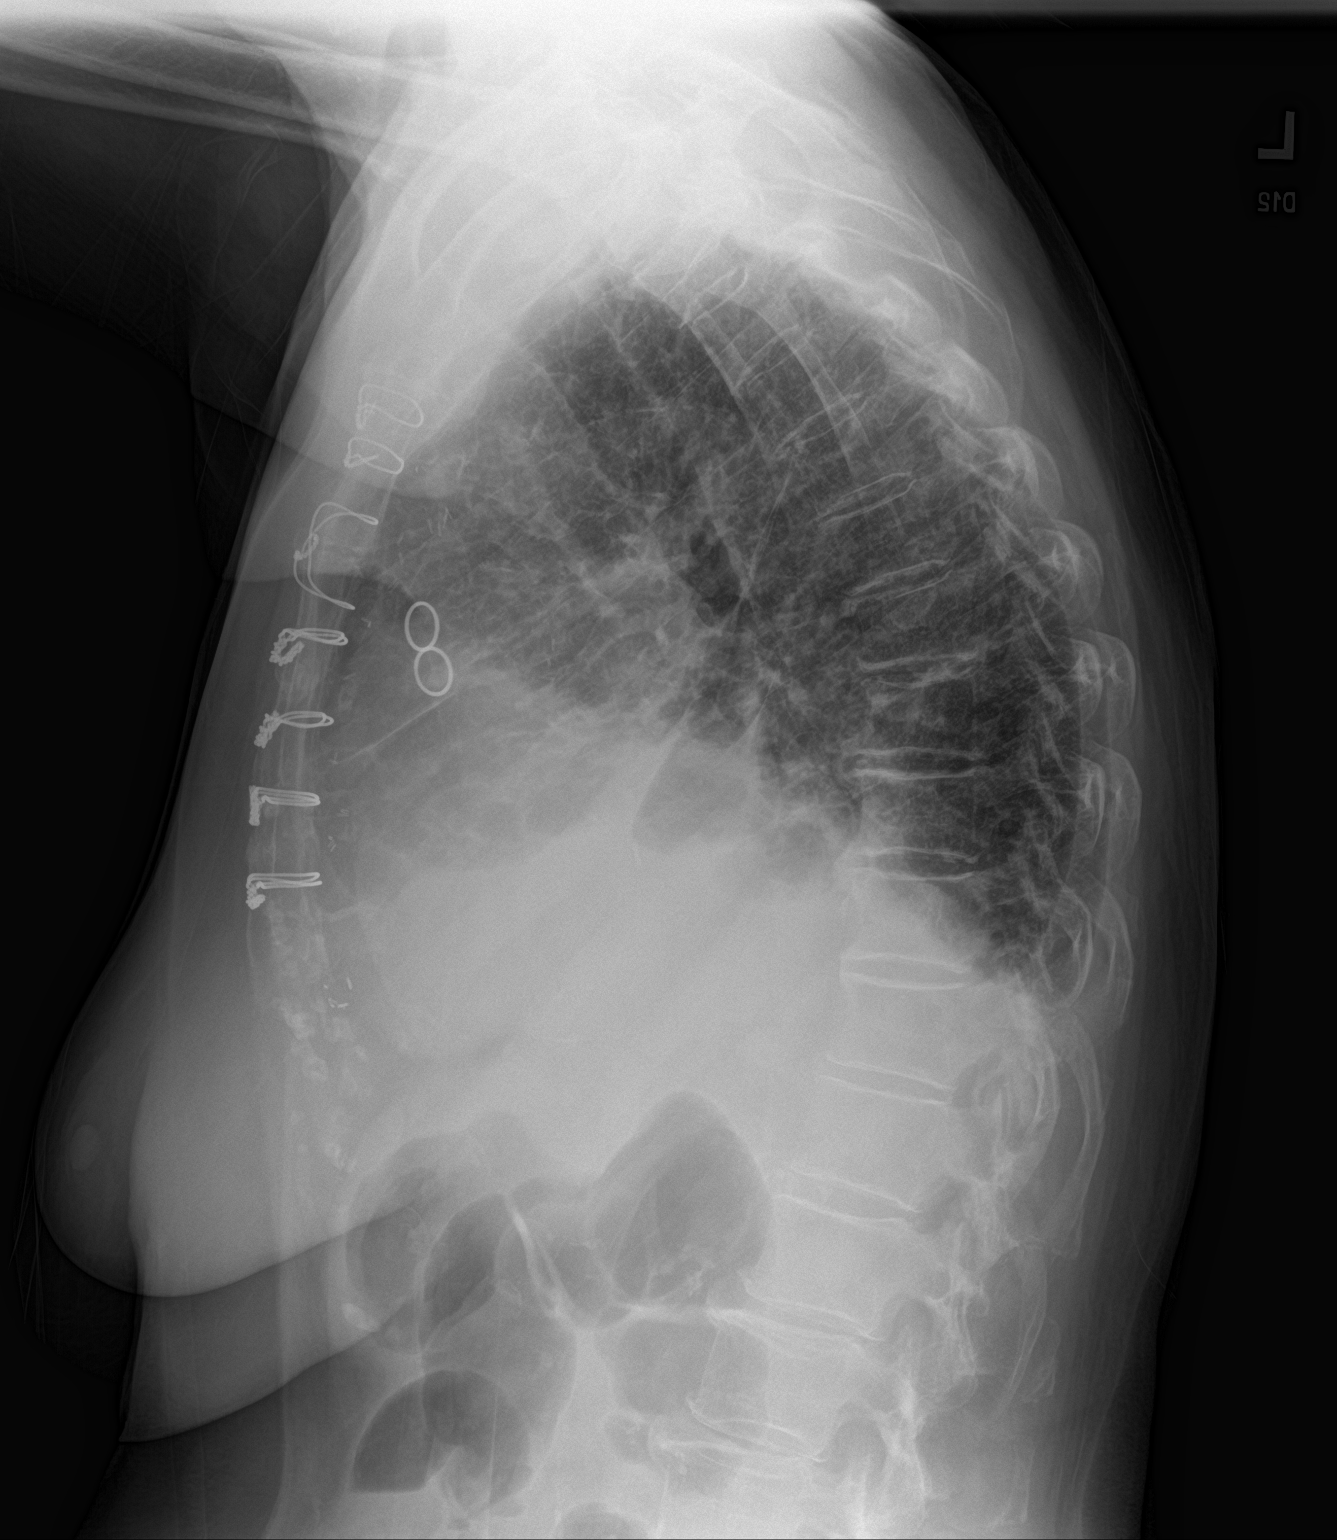

[2 of 2 positions shown; findings below may reference images not displayed]

FINDINGS: Post median sternotomy and CABG. Improved cardiomegaly from 8441.
There are moderate bilateral pleural effusions, associated bibasilar
volume loss and opacity typical of compressive atelectasis. This
appears similar on the right but worsened on the left from
radiographs 2 years ago. Interstitial coarsening may represent
pulmonary edema or bronchitic change given history of COPD. No focal
airspace disease in the upper lung zones. There is no pneumothorax.
No acute osseous abnormalities are seen.
IMPRESSION: 1. Moderate bilateral pleural effusions, with associated bibasilar
volume loss/atelectasis. Right pleural effusion is similar to exam 2
years ago, left pleural effusion has increased since that time.
2. Interstitial coarsening may represent pulmonary edema or
bronchitic change given history of COPD.

## 2021-10-26 DIAGNOSIS — J9601 Acute respiratory failure with hypoxia: Secondary | ICD-10-CM | POA: Diagnosis not present

## 2021-10-26 DIAGNOSIS — J449 Chronic obstructive pulmonary disease, unspecified: Secondary | ICD-10-CM | POA: Diagnosis not present

## 2021-11-25 DIAGNOSIS — J9601 Acute respiratory failure with hypoxia: Secondary | ICD-10-CM | POA: Diagnosis not present

## 2021-11-25 DIAGNOSIS — J449 Chronic obstructive pulmonary disease, unspecified: Secondary | ICD-10-CM | POA: Diagnosis not present

## 2021-12-26 DIAGNOSIS — J449 Chronic obstructive pulmonary disease, unspecified: Secondary | ICD-10-CM | POA: Diagnosis not present

## 2021-12-26 DIAGNOSIS — J9601 Acute respiratory failure with hypoxia: Secondary | ICD-10-CM | POA: Diagnosis not present

## 2022-01-17 DIAGNOSIS — N1832 Chronic kidney disease, stage 3b: Secondary | ICD-10-CM | POA: Diagnosis not present

## 2022-01-17 DIAGNOSIS — I5042 Chronic combined systolic (congestive) and diastolic (congestive) heart failure: Secondary | ICD-10-CM | POA: Diagnosis not present

## 2022-01-17 DIAGNOSIS — I110 Hypertensive heart disease with heart failure: Secondary | ICD-10-CM | POA: Diagnosis not present

## 2022-01-17 DIAGNOSIS — I25708 Atherosclerosis of coronary artery bypass graft(s), unspecified, with other forms of angina pectoris: Secondary | ICD-10-CM | POA: Diagnosis not present

## 2022-01-17 DIAGNOSIS — I10 Essential (primary) hypertension: Secondary | ICD-10-CM | POA: Diagnosis not present

## 2022-01-25 DIAGNOSIS — J449 Chronic obstructive pulmonary disease, unspecified: Secondary | ICD-10-CM | POA: Diagnosis not present

## 2022-01-25 DIAGNOSIS — J9601 Acute respiratory failure with hypoxia: Secondary | ICD-10-CM | POA: Diagnosis not present

## 2022-02-25 DIAGNOSIS — J9601 Acute respiratory failure with hypoxia: Secondary | ICD-10-CM | POA: Diagnosis not present

## 2022-02-25 DIAGNOSIS — J449 Chronic obstructive pulmonary disease, unspecified: Secondary | ICD-10-CM | POA: Diagnosis not present

## 2022-03-28 DIAGNOSIS — J449 Chronic obstructive pulmonary disease, unspecified: Secondary | ICD-10-CM | POA: Diagnosis not present

## 2022-03-28 DIAGNOSIS — J9601 Acute respiratory failure with hypoxia: Secondary | ICD-10-CM | POA: Diagnosis not present

## 2022-04-25 DIAGNOSIS — L821 Other seborrheic keratosis: Secondary | ICD-10-CM | POA: Diagnosis not present

## 2022-04-25 DIAGNOSIS — L570 Actinic keratosis: Secondary | ICD-10-CM | POA: Diagnosis not present

## 2022-04-27 DIAGNOSIS — J449 Chronic obstructive pulmonary disease, unspecified: Secondary | ICD-10-CM | POA: Diagnosis not present

## 2022-04-27 DIAGNOSIS — J9601 Acute respiratory failure with hypoxia: Secondary | ICD-10-CM | POA: Diagnosis not present

## 2022-05-09 ENCOUNTER — Encounter (INDEPENDENT_AMBULATORY_CARE_PROVIDER_SITE_OTHER): Payer: Self-pay

## 2022-05-28 DIAGNOSIS — J449 Chronic obstructive pulmonary disease, unspecified: Secondary | ICD-10-CM | POA: Diagnosis not present

## 2022-05-28 DIAGNOSIS — J9601 Acute respiratory failure with hypoxia: Secondary | ICD-10-CM | POA: Diagnosis not present

## 2022-06-24 DIAGNOSIS — J984 Other disorders of lung: Secondary | ICD-10-CM | POA: Diagnosis not present

## 2022-06-24 DIAGNOSIS — Z780 Asymptomatic menopausal state: Secondary | ICD-10-CM | POA: Diagnosis not present

## 2022-06-24 DIAGNOSIS — Z0000 Encounter for general adult medical examination without abnormal findings: Secondary | ICD-10-CM | POA: Diagnosis not present

## 2022-06-24 DIAGNOSIS — R7309 Other abnormal glucose: Secondary | ICD-10-CM | POA: Diagnosis not present

## 2022-06-24 DIAGNOSIS — I10 Essential (primary) hypertension: Secondary | ICD-10-CM | POA: Diagnosis not present

## 2022-06-24 DIAGNOSIS — N1832 Chronic kidney disease, stage 3b: Secondary | ICD-10-CM | POA: Diagnosis not present

## 2022-06-24 DIAGNOSIS — Z79899 Other long term (current) drug therapy: Secondary | ICD-10-CM | POA: Diagnosis not present

## 2022-06-24 DIAGNOSIS — I25708 Atherosclerosis of coronary artery bypass graft(s), unspecified, with other forms of angina pectoris: Secondary | ICD-10-CM | POA: Diagnosis not present

## 2022-06-27 DIAGNOSIS — J9601 Acute respiratory failure with hypoxia: Secondary | ICD-10-CM | POA: Diagnosis not present

## 2022-06-27 DIAGNOSIS — J449 Chronic obstructive pulmonary disease, unspecified: Secondary | ICD-10-CM | POA: Diagnosis not present

## 2022-07-11 DIAGNOSIS — J449 Chronic obstructive pulmonary disease, unspecified: Secondary | ICD-10-CM | POA: Diagnosis not present

## 2022-07-11 DIAGNOSIS — J9601 Acute respiratory failure with hypoxia: Secondary | ICD-10-CM | POA: Diagnosis not present

## 2022-07-28 DIAGNOSIS — J449 Chronic obstructive pulmonary disease, unspecified: Secondary | ICD-10-CM | POA: Diagnosis not present

## 2022-07-28 DIAGNOSIS — J9601 Acute respiratory failure with hypoxia: Secondary | ICD-10-CM | POA: Diagnosis not present

## 2022-08-28 DIAGNOSIS — J9601 Acute respiratory failure with hypoxia: Secondary | ICD-10-CM | POA: Diagnosis not present

## 2022-08-28 DIAGNOSIS — J449 Chronic obstructive pulmonary disease, unspecified: Secondary | ICD-10-CM | POA: Diagnosis not present

## 2022-09-26 DIAGNOSIS — J449 Chronic obstructive pulmonary disease, unspecified: Secondary | ICD-10-CM | POA: Diagnosis not present

## 2022-09-26 DIAGNOSIS — J9601 Acute respiratory failure with hypoxia: Secondary | ICD-10-CM | POA: Diagnosis not present

## 2022-10-27 DIAGNOSIS — J9601 Acute respiratory failure with hypoxia: Secondary | ICD-10-CM | POA: Diagnosis not present

## 2022-10-27 DIAGNOSIS — J449 Chronic obstructive pulmonary disease, unspecified: Secondary | ICD-10-CM | POA: Diagnosis not present

## 2022-11-26 DIAGNOSIS — J9601 Acute respiratory failure with hypoxia: Secondary | ICD-10-CM | POA: Diagnosis not present

## 2022-11-26 DIAGNOSIS — J449 Chronic obstructive pulmonary disease, unspecified: Secondary | ICD-10-CM | POA: Diagnosis not present

## 2023-01-16 DIAGNOSIS — I110 Hypertensive heart disease with heart failure: Secondary | ICD-10-CM | POA: Diagnosis not present

## 2023-01-16 DIAGNOSIS — N343 Urethral syndrome, unspecified: Secondary | ICD-10-CM | POA: Diagnosis not present

## 2023-01-16 DIAGNOSIS — I25708 Atherosclerosis of coronary artery bypass graft(s), unspecified, with other forms of angina pectoris: Secondary | ICD-10-CM | POA: Diagnosis not present

## 2023-01-16 DIAGNOSIS — J984 Other disorders of lung: Secondary | ICD-10-CM | POA: Diagnosis not present

## 2023-01-16 DIAGNOSIS — N1832 Chronic kidney disease, stage 3b: Secondary | ICD-10-CM | POA: Diagnosis not present

## 2023-01-16 DIAGNOSIS — I10 Essential (primary) hypertension: Secondary | ICD-10-CM | POA: Diagnosis not present

## 2023-01-16 DIAGNOSIS — I5042 Chronic combined systolic (congestive) and diastolic (congestive) heart failure: Secondary | ICD-10-CM | POA: Diagnosis not present

## 2023-01-16 DIAGNOSIS — R7303 Prediabetes: Secondary | ICD-10-CM | POA: Diagnosis not present

## 2023-01-16 DIAGNOSIS — R310 Gross hematuria: Secondary | ICD-10-CM | POA: Diagnosis not present

## 2023-07-11 DIAGNOSIS — R8290 Unspecified abnormal findings in urine: Secondary | ICD-10-CM | POA: Diagnosis not present

## 2023-07-11 DIAGNOSIS — N1832 Chronic kidney disease, stage 3b: Secondary | ICD-10-CM | POA: Diagnosis not present

## 2023-07-11 DIAGNOSIS — R7303 Prediabetes: Secondary | ICD-10-CM | POA: Diagnosis not present

## 2023-07-11 DIAGNOSIS — I10 Essential (primary) hypertension: Secondary | ICD-10-CM | POA: Diagnosis not present

## 2023-07-18 DIAGNOSIS — I25708 Atherosclerosis of coronary artery bypass graft(s), unspecified, with other forms of angina pectoris: Secondary | ICD-10-CM | POA: Diagnosis not present

## 2023-07-18 DIAGNOSIS — I10 Essential (primary) hypertension: Secondary | ICD-10-CM | POA: Diagnosis not present

## 2023-07-18 DIAGNOSIS — N343 Urethral syndrome, unspecified: Secondary | ICD-10-CM | POA: Diagnosis not present

## 2023-07-18 DIAGNOSIS — J984 Other disorders of lung: Secondary | ICD-10-CM | POA: Diagnosis not present

## 2023-07-18 DIAGNOSIS — Z0000 Encounter for general adult medical examination without abnormal findings: Secondary | ICD-10-CM | POA: Diagnosis not present

## 2023-07-18 DIAGNOSIS — N1832 Chronic kidney disease, stage 3b: Secondary | ICD-10-CM | POA: Diagnosis not present

## 2023-07-18 DIAGNOSIS — I110 Hypertensive heart disease with heart failure: Secondary | ICD-10-CM | POA: Diagnosis not present

## 2023-07-18 DIAGNOSIS — I5042 Chronic combined systolic (congestive) and diastolic (congestive) heart failure: Secondary | ICD-10-CM | POA: Diagnosis not present

## 2024-02-06 DIAGNOSIS — I110 Hypertensive heart disease with heart failure: Secondary | ICD-10-CM | POA: Diagnosis not present

## 2024-02-06 DIAGNOSIS — N343 Urethral syndrome, unspecified: Secondary | ICD-10-CM | POA: Diagnosis not present

## 2024-02-06 DIAGNOSIS — E119 Type 2 diabetes mellitus without complications: Secondary | ICD-10-CM | POA: Diagnosis not present

## 2024-02-06 DIAGNOSIS — I10 Essential (primary) hypertension: Secondary | ICD-10-CM | POA: Diagnosis not present

## 2024-02-06 DIAGNOSIS — N1832 Chronic kidney disease, stage 3b: Secondary | ICD-10-CM | POA: Diagnosis not present

## 2024-02-06 DIAGNOSIS — I5042 Chronic combined systolic (congestive) and diastolic (congestive) heart failure: Secondary | ICD-10-CM | POA: Diagnosis not present

## 2024-02-06 DIAGNOSIS — I25708 Atherosclerosis of coronary artery bypass graft(s), unspecified, with other forms of angina pectoris: Secondary | ICD-10-CM | POA: Diagnosis not present

## 2024-03-04 DIAGNOSIS — I110 Hypertensive heart disease with heart failure: Secondary | ICD-10-CM | POA: Diagnosis not present

## 2024-03-04 DIAGNOSIS — I25708 Atherosclerosis of coronary artery bypass graft(s), unspecified, with other forms of angina pectoris: Secondary | ICD-10-CM | POA: Diagnosis not present

## 2024-03-04 DIAGNOSIS — I5042 Chronic combined systolic (congestive) and diastolic (congestive) heart failure: Secondary | ICD-10-CM | POA: Diagnosis not present

## 2024-03-21 DIAGNOSIS — L570 Actinic keratosis: Secondary | ICD-10-CM | POA: Diagnosis not present

## 2024-03-21 DIAGNOSIS — L821 Other seborrheic keratosis: Secondary | ICD-10-CM | POA: Diagnosis not present

## 2024-03-21 DIAGNOSIS — L738 Other specified follicular disorders: Secondary | ICD-10-CM | POA: Diagnosis not present

## 2024-03-21 DIAGNOSIS — L218 Other seborrheic dermatitis: Secondary | ICD-10-CM | POA: Diagnosis not present

## 2024-04-16 ENCOUNTER — Encounter: Payer: Self-pay | Admitting: Podiatry

## 2024-04-16 ENCOUNTER — Ambulatory Visit: Admitting: Podiatry

## 2024-04-16 VITALS — Ht 65.0 in | Wt 144.0 lb

## 2024-04-16 DIAGNOSIS — B351 Tinea unguium: Secondary | ICD-10-CM | POA: Diagnosis not present

## 2024-04-16 DIAGNOSIS — M79674 Pain in right toe(s): Secondary | ICD-10-CM | POA: Diagnosis not present

## 2024-04-16 DIAGNOSIS — M79675 Pain in left toe(s): Secondary | ICD-10-CM | POA: Diagnosis not present

## 2024-04-16 NOTE — Progress Notes (Signed)
   Chief Complaint  Patient presents with   Toe Pain    Pt is here due to bilateral great toe and pinky toe redness, believes this is happening because whe wears her shoes to small, no other complaints.     SUBJECTIVE Patient presents to office today complaining of elongated, thickened nails that cause pain while ambulating in shoes.  Patient is unable to trim their own nails. Patient is here for further evaluation and treatment.  Past Medical History:  Diagnosis Date   Arthritis    CAD (coronary artery disease)    CHF (congestive heart failure) (HCC)    diastolic heart failure   Depression    Heart attack (HCC) 1994   Hypertension     No Known Allergies   OBJECTIVE General Patient is awake, alert, and oriented x 3 and in no acute distress. Derm Skin is dry and supple bilateral. Negative open lesions or macerations. Remaining integument unremarkable. Nails are tender, long, thickened and dystrophic with subungual debris, consistent with onychomycosis, 1-5 bilateral. No signs of infection noted. Vasc  DP and PT pedal pulses palpable bilaterally. Temperature gradient within normal limits.  Neuro the intact via light touch Musculoskeletal Exam No symptomatic pedal deformities noted bilateral. Muscular strength within normal limits.  ASSESSMENT 1.  Pain due to onychomycosis of toenails both  PLAN OF CARE -Patient evaluated today.  -Instructed to maintain good pedal hygiene and foot care.  -Mechanical debridement of nails 1-5 bilaterally performed using a nail nipper. Filed with dremel without incident.  -There is some slight irritation on the bilateral great toes and fifth digits likely secondary to narrow fitting or poor fitting shoes.  She has a better pair of Brooks running shoes but she does not wear them.  Recommend shoes that allow plenty of room in the toebox area -Return to clinic PRN   Thresa EMERSON Sar, DPM [facility]  Dr. Thresa EMERSON Sar, DPM    [address] [place], [place] [address]                Office [phone]  Fax [phone]
